# Patient Record
Sex: Male | Born: 1935 | Race: White | Hispanic: No | Marital: Married | Smoking: Former smoker
Health system: Southern US, Community
[De-identification: ages and names within clinical notes are randomized; demographics above are authoritative.]

## PROBLEM LIST (undated history)

## (undated) DIAGNOSIS — T18128A Food in esophagus causing other injury, initial encounter: Secondary | ICD-10-CM

## (undated) DIAGNOSIS — K222 Esophageal obstruction: Secondary | ICD-10-CM

## (undated) DIAGNOSIS — I350 Nonrheumatic aortic (valve) stenosis: Principal | ICD-10-CM

## (undated) DIAGNOSIS — I714 Abdominal aortic aneurysm, without rupture, unspecified: Secondary | ICD-10-CM

## (undated) DIAGNOSIS — N184 Chronic kidney disease, stage 4 (severe): Secondary | ICD-10-CM

## (undated) DIAGNOSIS — K2270 Barrett's esophagus without dysplasia: Secondary | ICD-10-CM

## (undated) DIAGNOSIS — W44F3XA Food entering into or through a natural orifice, initial encounter: Secondary | ICD-10-CM

## (undated) DIAGNOSIS — C159 Malignant neoplasm of esophagus, unspecified: Secondary | ICD-10-CM

## (undated) DIAGNOSIS — K3184 Gastroparesis: Secondary | ICD-10-CM

## (undated) DIAGNOSIS — I10 Essential (primary) hypertension: Secondary | ICD-10-CM

## (undated) DIAGNOSIS — I420 Dilated cardiomyopathy: Secondary | ICD-10-CM

## (undated) DIAGNOSIS — E039 Hypothyroidism, unspecified: Secondary | ICD-10-CM

## (undated) DIAGNOSIS — M1990 Unspecified osteoarthritis, unspecified site: Secondary | ICD-10-CM

## (undated) HISTORY — DX: Gastroparesis: K31.84

## (undated) HISTORY — PX: VAGOTOMY: SUR1431

## (undated) HISTORY — PX: ESOPHAGECTOMY: SUR457

## (undated) HISTORY — DX: Esophageal obstruction: K22.2

## (undated) HISTORY — DX: Dilated cardiomyopathy: I42.0

## (undated) HISTORY — DX: Malignant neoplasm of esophagus, unspecified: C15.9

## (undated) HISTORY — DX: Food entering into or through a natural orifice, initial encounter: W44.F3XA

## (undated) HISTORY — DX: Hypothyroidism, unspecified: E03.9

## (undated) HISTORY — DX: Essential (primary) hypertension: I10

## (undated) HISTORY — PX: UMBILICAL HERNIA REPAIR: SHX196

## (undated) HISTORY — DX: Food in esophagus causing other injury, initial encounter: T18.128A

## (undated) HISTORY — DX: Nonrheumatic aortic (valve) stenosis: I35.0

## (undated) HISTORY — DX: Barrett's esophagus without dysplasia: K22.70

---

## 1988-06-23 DIAGNOSIS — C159 Malignant neoplasm of esophagus, unspecified: Secondary | ICD-10-CM

## 1988-06-23 HISTORY — DX: Malignant neoplasm of esophagus, unspecified: C15.9

## 1999-06-06 ENCOUNTER — Encounter: Admission: RE | Admit: 1999-06-06 | Discharge: 1999-06-06 | Payer: Self-pay | Admitting: Thoracic Surgery

## 1999-06-06 ENCOUNTER — Encounter: Payer: Self-pay | Admitting: Thoracic Surgery

## 2000-06-11 ENCOUNTER — Encounter: Payer: Self-pay | Admitting: Thoracic Surgery

## 2000-06-11 ENCOUNTER — Encounter: Admission: RE | Admit: 2000-06-11 | Discharge: 2000-06-11 | Payer: Self-pay | Admitting: Thoracic Surgery

## 2001-12-08 ENCOUNTER — Encounter: Admission: RE | Admit: 2001-12-08 | Discharge: 2001-12-08 | Payer: Self-pay | Admitting: Family Medicine

## 2001-12-08 ENCOUNTER — Encounter: Payer: Self-pay | Admitting: Family Medicine

## 2002-04-18 ENCOUNTER — Encounter: Payer: Self-pay | Admitting: Family Medicine

## 2002-04-18 ENCOUNTER — Encounter: Admission: RE | Admit: 2002-04-18 | Discharge: 2002-04-18 | Payer: Self-pay | Admitting: Family Medicine

## 2002-04-28 ENCOUNTER — Ambulatory Visit (HOSPITAL_COMMUNITY): Admission: RE | Admit: 2002-04-28 | Discharge: 2002-04-28 | Payer: Self-pay | Admitting: Family Medicine

## 2002-04-28 ENCOUNTER — Encounter: Payer: Self-pay | Admitting: Family Medicine

## 2002-10-18 ENCOUNTER — Encounter: Admission: RE | Admit: 2002-10-18 | Discharge: 2002-10-18 | Payer: Self-pay | Admitting: Family Medicine

## 2002-10-18 ENCOUNTER — Encounter: Payer: Self-pay | Admitting: Family Medicine

## 2003-06-08 ENCOUNTER — Encounter: Admission: RE | Admit: 2003-06-08 | Discharge: 2003-06-08 | Payer: Self-pay | Admitting: Family Medicine

## 2004-07-12 ENCOUNTER — Encounter: Admission: RE | Admit: 2004-07-12 | Discharge: 2004-07-12 | Payer: Self-pay | Admitting: Family Medicine

## 2004-07-24 ENCOUNTER — Encounter: Admission: RE | Admit: 2004-07-24 | Discharge: 2004-07-24 | Payer: Self-pay

## 2004-07-30 ENCOUNTER — Ambulatory Visit (HOSPITAL_BASED_OUTPATIENT_CLINIC_OR_DEPARTMENT_OTHER): Admission: RE | Admit: 2004-07-30 | Discharge: 2004-07-30 | Payer: Self-pay

## 2004-07-30 ENCOUNTER — Ambulatory Visit (HOSPITAL_COMMUNITY): Admission: RE | Admit: 2004-07-30 | Discharge: 2004-07-30 | Payer: Self-pay

## 2005-04-23 ENCOUNTER — Encounter: Admission: RE | Admit: 2005-04-23 | Discharge: 2005-04-23 | Payer: Self-pay | Admitting: Family Medicine

## 2005-11-19 ENCOUNTER — Encounter: Admission: RE | Admit: 2005-11-19 | Discharge: 2005-11-19 | Payer: Self-pay | Admitting: Vascular Surgery

## 2006-04-13 ENCOUNTER — Encounter: Admission: RE | Admit: 2006-04-13 | Discharge: 2006-04-13 | Payer: Self-pay | Admitting: Family Medicine

## 2006-12-02 ENCOUNTER — Ambulatory Visit: Payer: Self-pay | Admitting: Vascular Surgery

## 2007-04-26 ENCOUNTER — Encounter: Admission: RE | Admit: 2007-04-26 | Discharge: 2007-04-26 | Payer: Self-pay | Admitting: Vascular Surgery

## 2007-06-01 ENCOUNTER — Ambulatory Visit: Payer: Self-pay | Admitting: Vascular Surgery

## 2007-06-24 HISTORY — PX: COARCTATION OF AORTA REPAIR: SHX261

## 2007-12-21 ENCOUNTER — Encounter: Admission: RE | Admit: 2007-12-21 | Discharge: 2007-12-21 | Payer: Self-pay | Admitting: Vascular Surgery

## 2007-12-21 ENCOUNTER — Ambulatory Visit: Payer: Self-pay | Admitting: Vascular Surgery

## 2008-01-03 ENCOUNTER — Ambulatory Visit: Payer: Self-pay | Admitting: Vascular Surgery

## 2008-01-03 ENCOUNTER — Ambulatory Visit (HOSPITAL_COMMUNITY): Admission: RE | Admit: 2008-01-03 | Discharge: 2008-01-03 | Payer: Self-pay | Admitting: Vascular Surgery

## 2008-01-12 ENCOUNTER — Ambulatory Visit: Payer: Self-pay

## 2008-01-22 HISTORY — PX: SP ENDO REPAIR INFRAREN AAA: HXRAD399

## 2008-01-25 ENCOUNTER — Ambulatory Visit: Payer: Self-pay | Admitting: Vascular Surgery

## 2008-02-02 ENCOUNTER — Encounter: Payer: Self-pay | Admitting: Vascular Surgery

## 2008-02-02 ENCOUNTER — Ambulatory Visit: Payer: Self-pay

## 2008-02-09 ENCOUNTER — Ambulatory Visit: Payer: Self-pay | Admitting: Vascular Surgery

## 2008-02-09 ENCOUNTER — Encounter: Payer: Self-pay | Admitting: Vascular Surgery

## 2008-02-09 ENCOUNTER — Inpatient Hospital Stay (HOSPITAL_COMMUNITY): Admission: RE | Admit: 2008-02-09 | Discharge: 2008-02-17 | Payer: Self-pay | Admitting: Vascular Surgery

## 2008-02-29 ENCOUNTER — Ambulatory Visit: Payer: Self-pay | Admitting: Vascular Surgery

## 2008-08-07 ENCOUNTER — Encounter: Payer: Self-pay | Admitting: Family Medicine

## 2009-02-19 ENCOUNTER — Ambulatory Visit: Payer: Self-pay | Admitting: Family Medicine

## 2009-02-19 DIAGNOSIS — I10 Essential (primary) hypertension: Secondary | ICD-10-CM | POA: Insufficient documentation

## 2009-02-19 DIAGNOSIS — Z8501 Personal history of malignant neoplasm of esophagus: Secondary | ICD-10-CM

## 2009-02-19 DIAGNOSIS — E039 Hypothyroidism, unspecified: Secondary | ICD-10-CM | POA: Insufficient documentation

## 2009-02-19 DIAGNOSIS — R5383 Other fatigue: Secondary | ICD-10-CM

## 2009-02-19 DIAGNOSIS — R5381 Other malaise: Secondary | ICD-10-CM

## 2009-02-22 LAB — CONVERTED CEMR LAB
ALT: 9 units/L (ref 0–53)
Albumin: 3.7 g/dL (ref 3.5–5.2)
Alkaline Phosphatase: 89 units/L (ref 39–117)
Basophils Absolute: 0 10*3/uL (ref 0.0–0.1)
Bilirubin, Direct: 0 mg/dL (ref 0.0–0.3)
Calcium: 10.2 mg/dL (ref 8.4–10.5)
Eosinophils Absolute: 0 10*3/uL (ref 0.0–0.7)
Hemoglobin: 12.6 g/dL — ABNORMAL LOW (ref 13.0–17.0)
Lymphocytes Relative: 24.4 % (ref 12.0–46.0)
Lymphs Abs: 1.5 10*3/uL (ref 0.7–4.0)
MCHC: 33.8 g/dL (ref 30.0–36.0)
Neutro Abs: 4.3 10*3/uL (ref 1.4–7.7)
Neutrophils Relative %: 67.7 % (ref 43.0–77.0)
Platelets: 308 10*3/uL (ref 150.0–400.0)
TSH: 1.43 microintl units/mL (ref 0.35–5.50)
Total Bilirubin: 1.1 mg/dL (ref 0.3–1.2)
WBC: 6.2 10*3/uL (ref 4.5–10.5)

## 2009-02-23 LAB — CONVERTED CEMR LAB: Transferrin: 234 mg/dL (ref 212.0–360.0)

## 2009-02-28 ENCOUNTER — Ambulatory Visit: Payer: Self-pay | Admitting: Family Medicine

## 2009-03-01 LAB — CONVERTED CEMR LAB: Ferritin: 80 ng/mL (ref 22.0–322.0)

## 2009-03-08 ENCOUNTER — Ambulatory Visit: Payer: Self-pay | Admitting: Vascular Surgery

## 2009-03-22 ENCOUNTER — Ambulatory Visit: Payer: Self-pay | Admitting: Family Medicine

## 2009-03-22 LAB — CONVERTED CEMR LAB: OCCULT 3: NEGATIVE

## 2009-06-01 ENCOUNTER — Encounter: Payer: Self-pay | Admitting: *Deleted

## 2009-07-24 ENCOUNTER — Ambulatory Visit: Payer: Self-pay | Admitting: Family Medicine

## 2009-07-24 DIAGNOSIS — K219 Gastro-esophageal reflux disease without esophagitis: Secondary | ICD-10-CM

## 2009-08-21 ENCOUNTER — Ambulatory Visit: Payer: Self-pay | Admitting: Family Medicine

## 2009-09-21 ENCOUNTER — Ambulatory Visit: Payer: Self-pay | Admitting: Family Medicine

## 2009-09-28 ENCOUNTER — Ambulatory Visit: Payer: Self-pay | Admitting: Family Medicine

## 2009-12-21 ENCOUNTER — Ambulatory Visit: Payer: Self-pay | Admitting: Family Medicine

## 2009-12-21 DIAGNOSIS — K5900 Constipation, unspecified: Secondary | ICD-10-CM | POA: Insufficient documentation

## 2009-12-25 LAB — CONVERTED CEMR LAB
BUN: 29 mg/dL — ABNORMAL HIGH (ref 6–23)
CO2: 25 meq/L (ref 19–32)
Chloride: 110 meq/L (ref 96–112)
Creatinine, Ser: 1.3 mg/dL (ref 0.4–1.5)
Potassium: 4.5 meq/L (ref 3.5–5.1)
Sodium: 143 meq/L (ref 135–145)
TSH: 2.71 microintl units/mL (ref 0.35–5.50)

## 2010-07-14 ENCOUNTER — Encounter: Payer: Self-pay | Admitting: Vascular Surgery

## 2010-07-23 NOTE — Assessment & Plan Note (Signed)
Summary: follow up appt/cjr   Vital Signs:  Patient profile:   77 year old male Height:      68 inches Weight:      137.50 pounds Temp:     99.1 degrees F oral BP sitting:   160 / 68  (left arm) Cuff size:   regular  Vitals Entered By: Sid Falcon LPN (July 24, 2009 11:31 AM) CC: Follow-up visit, written med refills   History of Present Illness: Patient here for followup regarding multiple medical problems including history of GERD, hypertension, and hypothyroidism. Remote history of esophageal cancer. He has been disease-free for many years. Reflux symptoms well controlled. No recent dysphagia.  Lab work checked in August thyroid stable that time. No sxs to suggest hypothyroidism.  Hypertension follow-up      This is a 77 year old man who presents for Hypertension follow-up.  The patient denies lightheadedness, urinary frequency, headaches, edema, impotence, rash, and fatigue.  The patient denies the following associated symptoms: chest pain, chest pressure, exercise intolerance, dyspnea, palpitations, syncope, leg edema, and pedal edema.  Compliance with medications (by patient report) has been near 100%.  The patient reports that dietary compliance has been good.    Preventive Screening-Counseling & Management  Alcohol-Tobacco     Smoking Status: quit     Year Started: 1953     Year Quit: 1972  Allergies: No Known Drug Allergies  Past History:  Family History: Last updated: 02/19/2009 father Pancreatic cancer  Social History: Last updated: 02/19/2009 Retired Married Former Smoker Alcohol use-no  Risk Factors: Smoking Status: quit (07/24/2009)  Past medical, surgical, family and social histories (including risk factors) reviewed for relevance to current acute and chronic problems.  Past Medical History: Hypertension Hypothyroidism Kidney stones Esophageal Cancer 1990  Past Surgical History: Reviewed history from 02/19/2009 and no changes  required. AAA repair 8/09 Esophageal sec to Cancer about 1990  Family History: Reviewed history from 02/19/2009 and no changes required. father Pancreatic cancer  Social History: Reviewed history from 02/19/2009 and no changes required. Retired Married Former Smoker Alcohol use-no  Review of Systems  The patient denies anorexia, fever, weight loss, weight gain, chest pain, syncope, dyspnea on exertion, peripheral edema, prolonged cough, headaches, abdominal pain, melena, and hematochezia.    Physical Exam  General:  Well-developed,well-nourished,in no acute distress; alert,appropriate and cooperative throughout examination Eyes:  pupils equal, pupils round, and pupils reactive to light.   Ears:  External ear exam shows no significant lesions or deformities.  Otoscopic examination reveals clear canals, tympanic membranes are intact bilaterally without bulging, retraction, inflammation or discharge. Hearing is grossly normal bilaterally. Mouth:  Oral mucosa and oropharynx without lesions or exudates.  Teeth in good repair. Neck:  No deformities, masses, or tenderness noted. Lungs:  Normal respiratory effort, chest expands symmetrically. Lungs are clear to auscultation, no crackles or wheezes. Heart:  normal rate and regular rhythm.  2 to 3/6 SEM RUSB. Abdomen:  soft, non-tender, and no masses.   Extremities:  No clubbing, cyanosis, edema, or deformity noted with normal full range of motion of all joints.   Neurologic:  alert & oriented X3, strength normal in all extremities, and gait normal.   Cervical Nodes:  No lymphadenopathy noted   Impression & Recommendations:  Problem # 1:  HYPERTENSION (ICD-401.9) Assessment Deteriorated increase dose of losartan. His updated medication list for this problem includes:    Losartan Potassium-hctz 100-12.5 Mg Tabs (Losartan potassium-hctz) ..... One by mouth once daily  Problem #  2:  HYPOTHYROIDISM (ICD-244.9) Assessment:  Unchanged refilled med. His updated medication list for this problem includes:    Levothyroxine Sodium 75 Mcg Tabs (Levothyroxine sodium) .Marland Kitchen... 1 once daily  Problem # 3:  GERD (ICD-530.81) Assessment: Unchanged  His updated medication list for this problem includes:    Tgt Omeprazole 20 Mg Tbec (Omeprazole) .Marland Kitchen... 1 once daily  Complete Medication List: 1)  Levothyroxine Sodium 75 Mcg Tabs (Levothyroxine sodium) .Marland Kitchen.. 1 once daily 2)  Tgt Omeprazole 20 Mg Tbec (Omeprazole) .Marland Kitchen.. 1 once daily 3)  Losartan Potassium-hctz 100-12.5 Mg Tabs (Losartan potassium-hctz) .... One by mouth once daily 4)  Aspir-low 81 Mg Tbec (Aspirin) .Marland Kitchen.. 1 once daily 5)  Fish Oil 1200 Mg Caps (Omega-3 fatty acids) .... Once daily 6)  [facility] Stool Softener 100 Mg Caps (Docusate sodium) .... 2 daily  Patient Instructions: 1)  Please schedule a follow-up appointment in 1 month.  2)  May take 2 Hyzaar per day until current prescription runs out Prescriptions: TGT OMEPRAZOLE 20 MG TBEC (OMEPRAZOLE) 1 once daily  #90 x 3   Entered and Authorized by:   Evelena Peat MD   Signed by:   Evelena Peat MD on 07/24/2009   Method used:   Print then Give to Patient   RxID:   [id number] LOSARTAN POTASSIUM-HCTZ 100-12.5 MG TABS (LOSARTAN POTASSIUM-HCTZ) one by mouth once daily  #90 x 3   Entered and Authorized by:   Evelena Peat MD   Signed by:   Evelena Peat MD on 07/24/2009   Method used:   Print then Give to Patient   RxID:   [id number] LEVOTHYROXINE SODIUM 75 MCG TABS (LEVOTHYROXINE SODIUM) 1 once daily  #90 x 3   Entered and Authorized by:   Evelena Peat MD   Signed by:   Evelena Peat MD on 07/24/2009   Method used:   Print then Give to Patient   RxID:   [id number]

## 2010-07-23 NOTE — Assessment & Plan Note (Signed)
Summary: 3 month rov/njr   Vital Signs:  Patient profile:   77 year old male Weight:      139 pounds Temp:     98.3 degrees F oral BP sitting:   158 / 68  (left arm) Cuff size:   regular  Vitals Entered By: Sid Falcon LPN (December 22, 5619 1:13 PM)  Serial Vital Signs/Assessments:  Time      Position  BP       Pulse  Resp  Temp     By                     150/68                         Evelena Peat MD    History of Present Illness: Patient seen for followup hypertension. Addition of amlodipine several months ago and blood pressures by home readings 130s systolic and 60s diastolic. Compliant with medication. No side effects. No orthostatic symptoms. Denies any chest pains or palpitations.  Hypothyroidism treated with levothyroxine. Needs followup lab work.  No recent weight changes. Has difficulty gaining weight but stays very active physically.  Remote history of esophageal cancer. History of GERD. Symptoms controlled with once daily omeprazole.  Ongoing constipation issues. No bloody stools. No abdominal pain. No appetite or weight changes. Takes OTC meds inconsistently without relief.  Good fiber intake.  Has had prior colonoscopy -?date.  Allergies (verified): No Known Drug Allergies  Past History:  Past Medical History: Last updated: 07/24/2009 Hypertension Hypothyroidism Kidney stones Esophageal Cancer 1990  Past Surgical History: Last updated: 02/19/2009 AAA repair 8/09 Esophageal sec to Cancer about 1990  Social History: Last updated: 02/19/2009 Retired Married Former Smoker Alcohol use-no PMH-FH-SH reviewed for relevance  Review of Systems  The patient denies anorexia, fever, weight loss, chest pain, syncope, dyspnea on exertion, peripheral edema, prolonged cough, hemoptysis, abdominal pain, melena, hematochezia, and severe indigestion/heartburn.    Physical Exam  General:  Well-developed,well-nourished,in no acute distress; alert,appropriate and  cooperative throughout examination Mouth:  Oral mucosa and oropharynx without lesions or exudates.  Teeth in good repair. Neck:  No deformities, masses, or tenderness noted. Lungs:  Normal respiratory effort, chest expands symmetrically. Lungs are clear to auscultation, no crackles or wheezes. Heart:  regular rhythm and rate with 2-3/6 systolic murmur right upper sternal border Abdomen:  soft, non-tender, normal bowel sounds, no distention, and no masses.   Extremities:  no edema Neurologic:  alert & oriented X3, cranial nerves II-XII intact, and strength normal in all extremities.   Cervical Nodes:  No lymphadenopathy noted Psych:  Cognition and judgment appear intact. Alert and cooperative with normal attention span and concentration. No apparent delusions, illusions, hallucinations   Impression & Recommendations:  Problem # 1:  HYPERTENSION (ICD-401.9) Assessment Improved Good control by home readings and his cuff has compared in readings with ours on previous visit. His updated medication list for this problem includes:    Losartan Potassium-hctz 100-12.5 Mg Tabs (Losartan potassium-hctz) ..... One by mouth once daily    Amlodipine Besylate 2.5 Mg Tabs (Amlodipine besylate) ..... One by mouth q hs  Orders: Venipuncture (30865) TLB-BMP (Basic Metabolic Panel-BMET) (80048-METABOL) Prescription Created Electronically [phone])  Problem # 2:  HYPOTHYROIDISM (ICD-244.9) repeat TSH. His updated medication list for this problem includes:    Levothyroxine Sodium 75 Mcg Tabs (Levothyroxine sodium) .Marland Kitchen... 1 once daily  Orders: Venipuncture (62952) TLB-TSH (Thyroid Stimulating Hormone) (84443-TSH)  Problem #  3:  CONSTIPATION (ICD-564.00) Assessment: Deteriorated Discussed treatment options.  Increase fluids.  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  He will use Miralax only on rare as needed basis for severe constipation. His updated medication list for this problem  includes:    [facility] Stool Softener 100 Mg Caps (Docusate sodium) .Marland Kitchen... 2 daily  Problem # 4:  GERD (ICD-530.81) Assessment: Unchanged  His updated medication list for this problem includes:    Omeprazole 20 Mg Cpdr (Omeprazole) ..... Once daily  Complete Medication List: 1)  Levothyroxine Sodium 75 Mcg Tabs (Levothyroxine sodium) .Marland Kitchen.. 1 once daily 2)  Omeprazole 20 Mg Cpdr (Omeprazole) .... Once daily 3)  Losartan Potassium-hctz 100-12.5 Mg Tabs (Losartan potassium-hctz) .... One by mouth once daily 4)  Aspir-low 81 Mg Tbec (Aspirin) .Marland Kitchen.. 1 once daily 5)  Fish Oil 1200 Mg Caps (Omega-3 fatty acids) .... Once daily 6)  [facility] Stool Softener 100 Mg Caps (Docusate sodium) .... 2 daily 7)  Amlodipine Besylate 2.5 Mg Tabs (Amlodipine besylate) .... One by mouth q hs  Patient Instructions: 1)   drink plenty of fluids. 2)  consider use of over-the-counter stool softener such as Colace for constipation symptoms 3)  If not relieved with the above short-term infrequent use of MiraLax 4)  Please schedule a follow-up appointment in 6 months .  Prescriptions: AMLODIPINE BESYLATE 2.5 MG TABS (AMLODIPINE BESYLATE) one by mouth q hs  #30 x 11   Entered and Authorized by:   Evelena Peat MD   Signed by:   Evelena Peat MD on 12/21/2009   Method used:   Electronically to        [facility]  [place] [address] (retail)       [address]       [place], [place]  [address]       Ph: [phone] or [phone]       Fax: [phone]   RxID:   [id number]

## 2010-07-23 NOTE — Assessment & Plan Note (Signed)
Summary: 1 MONTH ROV/NJR   Vital Signs:  Patient profile:   77 year old male Weight:      140 pounds Temp:     98.5 degrees F oral BP sitting:   150 / 80  (left arm) Cuff size:   regular  Vitals Entered By: Sid Falcon LPN (August 22, 1658 10:49 AM)  Serial Vital Signs/Assessments:  Time      Position  BP       Pulse  Resp  Temp     By                     168/70                         Evelena Peat MD  CC: one month follow-up, BP, Hypertension Management   History of Present Illness: Follow up hypertension. Patient on losartan hydrochlorothiazide 100/12.5 one daily. No headaches or dizziness. Generally feels well. Not monitoring blood pressure at home. We have recently increased his losartan from 50 to100 mg. Denies any chest pains.  Hypertension History:      He denies headache, chest pain, palpitations, dyspnea with exertion, orthopnea, peripheral edema, neurologic problems, syncope, and side effects from treatment.        Positive major cardiovascular risk factors include male age 28 years old or older and hypertension.  Negative major cardiovascular risk factors include non-tobacco-user status.     Allergies (verified): No Known Drug Allergies  Past History:  Past Medical History: Last updated: 07/24/2009 Hypertension Hypothyroidism Kidney stones Esophageal Cancer 1990 PMH reviewed for relevance  Review of Systems      See HPI  Physical Exam  General:  Well-developed,well-nourished,in no acute distress; alert,appropriate and cooperative throughout examination Neck:  No deformities, masses, or tenderness noted. Lungs:  Normal respiratory effort, chest expands symmetrically. Lungs are clear to auscultation, no crackles or wheezes. Heart:  normal rate and regular rhythm.   Extremities:  no edema   Impression & Recommendations:  Problem # 1:  HYPERTENSION (ICD-401.9) Assessment Deteriorated add amlodipine 2.5 mg daily His updated medication list for this  problem includes:    Losartan Potassium-hctz 100-12.5 Mg Tabs (Losartan potassium-hctz) ..... One by mouth once daily    Amlodipine Besylate 2.5 Mg Tabs (Amlodipine besylate) ..... One by mouth q hs  Complete Medication List: 1)  Levothyroxine Sodium 75 Mcg Tabs (Levothyroxine sodium) .Marland Kitchen.. 1 once daily 2)  Omeprazole 20 Mg Cpdr (Omeprazole) .... Once daily 3)  Losartan Potassium-hctz 100-12.5 Mg Tabs (Losartan potassium-hctz) .... One by mouth once daily 4)  Aspir-low 81 Mg Tbec (Aspirin) .Marland Kitchen.. 1 once daily 5)  Fish Oil 1200 Mg Caps (Omega-3 fatty acids) .... Once daily 6)  [facility] Stool Softener 100 Mg Caps (Docusate sodium) .... 2 daily 7)  Amlodipine Besylate 2.5 Mg Tabs (Amlodipine besylate) .... One by mouth q hs  Hypertension Assessment/Plan:      The patient's hypertensive risk group is category B: At least one risk factor (excluding diabetes) with no target organ damage.  Today's blood pressure is 150/80.    Patient Instructions: 1)  Please schedule a follow-up appointment in 1 month.  2)  Limit your Sodium(salt) .  Prescriptions: AMLODIPINE BESYLATE 2.5 MG TABS (AMLODIPINE BESYLATE) one by mouth q hs  #30 x 5   Entered and Authorized by:   Evelena Peat MD   Signed by:   Evelena Peat MD on 08/21/2009   Method used:  [facility] to        [facility]  [place] [address]       [address]       [place], [place]  [address]       Ph: [phone] or [phone]       Fax: [phone]   RxID:   [id number]

## 2010-07-23 NOTE — Assessment & Plan Note (Signed)
Summary: 1 wk rov/njr   Vital Signs:  Patient profile:   77 year old male Weight:      141 pounds Temp:     98 degrees F oral BP sitting:   138 / 68  (left arm) Cuff size:   regular  Vitals Entered By: Sid Falcon LPN (September 28, 1608 1:10 PM) CC: BP check, Back Pain, Hypertension Management   History of Present Illness: Patient here for recheck blood pressure.  Excellent control by home readings ranging 113-138 systolic. No dizziness. No edema issues. Recent increased readings here. He remains on losartan hydrochlorothiazide in addition to low-dose amlodipine. States quite active. No exercise intolerance.  Hypertension History:      He denies headache, chest pain, palpitations, dyspnea with exertion, orthopnea, PND, peripheral edema, visual symptoms, neurologic problems, syncope, and side effects from treatment.        Positive major cardiovascular risk factors include male age 5 years old or older and hypertension.  Negative major cardiovascular risk factors include non-tobacco-user status.      Allergies (verified): No Known Drug Allergies  Past History:  Past Medical History: Last updated: 07/24/2009 Hypertension Hypothyroidism Kidney stones Esophageal Cancer 1990  Physical Exam  General:  Well-developed,well-nourished,in no acute distress; alert,appropriate and cooperative throughout examination Lungs:  Normal respiratory effort, chest expands symmetrically. Lungs are clear to auscultation, no crackles or wheezes. Heart:  normal rate and regular rhythm.   Extremities:  no edema   Impression & Recommendations:  Problem # 1:  HYPERTENSION (ICD-401.9) Assessment Improved by our machine today 138/68 and by his machine 133/65. Continue close monitoring. His updated medication list for this problem includes:    Losartan Potassium-hctz 100-12.5 Mg Tabs (Losartan potassium-hctz) ..... One by mouth once daily    Amlodipine Besylate 2.5 Mg Tabs (Amlodipine besylate)  ..... One by mouth q hs  Complete Medication List: 1)  Levothyroxine Sodium 75 Mcg Tabs (Levothyroxine sodium) .Marland Kitchen.. 1 once daily 2)  Omeprazole 20 Mg Cpdr (Omeprazole) .... Once daily 3)  Losartan Potassium-hctz 100-12.5 Mg Tabs (Losartan potassium-hctz) .... One by mouth once daily 4)  Aspir-low 81 Mg Tbec (Aspirin) .Marland Kitchen.. 1 once daily 5)  Fish Oil 1200 Mg Caps (Omega-3 fatty acids) .... Once daily 6)  [facility]vs Stool Softener 100 Mg Caps (Docusate sodium) .... 2 daily 7)  Amlodipine Besylate 2.5 Mg Tabs (Amlodipine besylate) .... One by mouth q hs  Hypertension Assessment/Plan:      The patient's hypertensive risk group is category B: At least one risk factor (excluding diabetes) with no target organ damage.  Today's blood pressure is 138/68.    Patient Instructions: 1)  Please schedule a follow-up appointment in 6 months .

## 2010-07-23 NOTE — Assessment & Plan Note (Signed)
Summary: 1 MONTH ROV/NJR   Vital Signs:  Patient profile:   77 year old male Weight:      142 pounds Temp:     98.7 degrees F oral BP sitting:   160 / 70  (left arm) Cuff size:   regular  Vitals Entered By: Sid Falcon LPN (September 22, 1306 11:09 AM) CC: 1 month follow-up on BP   History of Present Illness: patient here for f/u hypertension. Added amlodipine last visit and he has tolerated well with no side effects. Blood pressure well controlled by home readings with mostly 120s to 130s systolic. Diastolics consistently in the 60s. He denies any headaches, dizziness, or chest pain. Also remains on losartan HCTZ. Compliant with medication.  Allergies (verified): No Known Drug Allergies  Past History:  Past Medical History: Last updated: 07/24/2009 Hypertension Hypothyroidism Kidney stones Esophageal Cancer 1990 PMH reviewed for relevance  Review of Systems  The patient denies weight loss, weight gain, chest pain, syncope, dyspnea on exertion, peripheral edema, prolonged cough, headaches, hemoptysis, and abdominal pain.    Physical Exam  General:  Well-developed,well-nourished,in no acute distress; alert,appropriate and cooperative throughout examination Lungs:  Normal respiratory effort, chest expands symmetrically. Lungs are clear to auscultation, no crackles or wheezes. Heart:  normal rate and regular rhythm.  2/6 systolic murmur right upper sternal border Extremities:  no edema   Impression & Recommendations:  Problem # 1:  HYPERTENSION (ICD-401.9) improved home readings but elevated systolic reading here. Patient to bring back his blood pressure cuff next week to check with ours His updated medication list for this problem includes:    Losartan Potassium-hctz 100-12.5 Mg Tabs (Losartan potassium-hctz) ..... One by mouth once daily    Amlodipine Besylate 2.5 Mg Tabs (Amlodipine besylate) ..... One by mouth q hs  Complete Medication List: 1)  Levothyroxine Sodium  75 Mcg Tabs (Levothyroxine sodium) .Marland Kitchen.. 1 once daily 2)  Omeprazole 20 Mg Cpdr (Omeprazole) .... Once daily 3)  Losartan Potassium-hctz 100-12.5 Mg Tabs (Losartan potassium-hctz) .... One by mouth once daily 4)  Aspir-low 81 Mg Tbec (Aspirin) .Marland Kitchen.. 1 once daily 5)  Fish Oil 1200 Mg Caps (Omega-3 fatty acids) .... Once daily 6)  [facility]vs Stool Softener 100 Mg Caps (Docusate sodium) .... 2 daily 7)  Amlodipine Besylate 2.5 Mg Tabs (Amlodipine besylate) .... One by mouth q hs  Patient Instructions: 1)  bring back your blood pressure cuff to check with ours next week. 2)  Please schedule a follow-up appointment in 3 months .  Prescriptions: OMEPRAZOLE 20 MG CPDR (OMEPRAZOLE) once daily  #90 x 3   Entered and Authorized by:   Evelena Peat MD   Signed by:   Evelena Peat MD on 09/21/2009   Method used:   Print then Give to Patient   RxID:   [id number]

## 2010-11-05 NOTE — Assessment & Plan Note (Signed)
OFFICE VISIT   Isaiah Graham, Isaiah Graham  DOB:  April 14, 1930                                       06/01/2007  ZOXWR#:[id number]   I saw the patient in the office today for continued followup of his  infrarenal abdominal aortic aneurysm.  Since I saw him last 6 months  ago, he has had no significant abdominal or back pain.  There has been  no significant change in his medical history, except that he had an  episode of Bell's palsy.   REVIEW OF SYSTEMS:  He has had no recent chest pain, chest pressure,  palpitations, or arrhythmias.  He has had no bronchitis, asthma, or wheezing.  He is not a smoker.  He  quit in 1972.  He has had no claudication, rest pain, or non-healing ulcers.   PHYSICAL EXAMINATION:  This is a pleasant 77 year old gentleman who  appears his stated age.  Blood pressure is 190/70, heart rate is 58.  I  do not detect any carotid bruits.  There is no cervical lymphadenopathy.  Lungs are clear bilaterally to auscultation.  On cardiac exam, he has a  regular rate and rhythm.  His aneurysm is palpable but nontender.  He  has palpable femoral, popliteal, and pedal pulses bilaterally.  He has  no evidence of atheroembolic disease.   Ultrasound in our office today shows that the maximum diameter of his  aneurysm is 5 cm, which is up approximately 3 mm compared to his study 6  months ago.   He understands that we generally would not consider elective repair of  the aneurysm unless it reached 5.5 cm in maximum diameter.  I plan on  seeing him back in 6 months with a followup CT scan.  He also has some  small pulmonary nodules, which we have been following.  On his most  recent CT scan of the chest in November of this year, the nodules were  stable in size.  We  will obtain a CT of the chest in 6 months also.  If the CT is stable at  that time, we might be able to extend his followup of these nodules out  to a year.   Di Kindle. Edilia Bo,  M.D.  Electronically Signed   CSD/MEDQ  D:  06/01/2007  T:  06/02/2007  Job:  [id number]   cc:   Evelena Peat, M.D.

## 2010-11-05 NOTE — Assessment & Plan Note (Signed)
OFFICE VISIT   [name], [name]  DOB:  Nov 15, 1929                                       02/29/2008  ZOXWR#:[id number]   I saw the patient in the office today for followup after recent repair  of his abdominal aortic aneurysm.  This is a 77 year old gentleman whom  I have been following with an abdominal aortic aneurysm that enlarged to  greater than 5.5 cm.  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  He underwent elective repair  with ligation of the left hypogastric artery aneurysm on 02/09/2008.  He  did well postoperatively and was discharged on February 16, 1989.  He did  have some anemia postoperatively and was started on iron at the time of  discharge.   Since I saw him in the hospital, he states his appetite is improved.  He  has been gradually regaining his strength and he has no specific  complaints.   PHYSICAL EXAMINATION:  His blood pressure is 188/75, heart rate is 62.  Lungs are clear bilaterally to auscultation.  Cardiac exam, he has a  regular rate and rhythm.  Abdomen is soft and nontender.  He has normal  pitched bowel sounds.  His incision is healing nicely.  Palpable femoral  and pedal pulses.   Doppler study in our office today shows normal ABIs of 100% bilaterally.   Overall, I am pleased with his progress.  I plan on seeing him back in 6  months.  I have stressed to him the importance of receiving prophylactic  antibiotics for any invasive procedures such as colonoscopy.   Di Kindle. Edilia Bo, M.D.  Electronically Signed   CSD/MEDQ  D:  02/29/2008  T:  03/01/2008  Job:  [id number]   cc:   Evelena Peat, M.D.

## 2010-11-05 NOTE — Op Note (Signed)
Isaiah Graham, Isaiah NO.:  [id number]   MEDICAL RECORD NO.:  [id number]          PATIENT TYPE:  INP   LOCATION:  2304                         FACILITY:  [facility]   PHYSICIAN:  Di Kindle. Edilia Bo, M.D.DATE OF BIRTH:  [birth year]   DATE OF PROCEDURE:  02/09/2008  DATE OF DISCHARGE:                               OPERATIVE REPORT   PREOPERATIVE DIAGNOSES:  Infrarenal abdominal aortic aneurysm, 5.5 cm,  and left hypogastric artery aneurysm.   POSTOPERATIVE DIAGNOSES:  Infrarenal abdominal aortic aneurysm, 5.5-cm,  and left hypogastric artery aneurysm.   PROCEDURE:  1. Open repair of abdominal aortic aneurysm with aorto-bi-iliac bypass      graft (16 x 8-mm Dacron graft).  2. Ligation of left hypogastric artery aneurysm.   SURGEON:  Di Kindle. Edilia Bo, MD   ASSISTANT:  Jerold Coombe, PA   ANESTHESIA:  General.   INDICATIONS:  This is a 77 year old gentleman, who I had been following  with an abdominal aortic aneurysm that enlarged to greater than 5.5 cm.  He also had bilateral common iliac artery aneurysms, and I did not think  he was a good candidate for endovascular repair of his aneurysm in  addition to having a left hypogastric artery aneurysm.  Therefore, open  repair was recommended because of the 5-10% per year risk of rupture.  The procedure and potential complications were discussed with the  patient preoperatively.  All of his questions were answered.  He was  agreeable to proceed.   TECHNIQUE:  The patient was taken to the operating room after a Swan-  Ganz catheter and arterial line had been placed by Anesthesia.  The  abdomen and groins were prepped and draped in usual sterile fashion.  The abdomen was entered through a midline incision.  This patient had  had a previous jejunostomy and it was a loop of jejunum's which was  adhered to the intra-abdominal wall and this was taken down without  difficulty.  The patient had had a previous  transhiatal esophagectomy  and jejunostomy in 1996.  The small bowel was reflected to the right.  The retroperitoneal tissue was divided allowing exposure of the aneurysm  up to the level of the renal vein.  The infrarenal aorta was dissected  free circumferentially.  The IMA was controlled with a 2-0 silk tie.  I  extended the division of the retroperitoneum down the right side onto  the right common iliac artery which was ectatic and aneurysmal.  This  was taken down to the bifurcation and the left hypogastric artery and  the left external iliac artery were controlled.  Next, the sigmoid colon  was reflected to the right, and the retroperitoneal plane entered, and  the external iliac artery on the left was dissected free as well as the  hypogastric artery aneurysm.  I was able to get a Tevdek suture around  the hypogastric artery aneurysm and this was ligated.  I also placed a  Tevdek suture around the left common iliac artery.  The tunnel was then  created under the retroperitoneum on the left.  Next, a 16 x 8 graft was  selected.  The patient was heparinized and also received mannitol.  The  hypogastric artery on the right and the external iliac artery were  clamped.  The common iliac artery was ligated after the distal external  iliac artery on the left was clamped.  The infrarenal aorta was clamped  and the aneurysm entered.  Lumbars were oversewn with 2-0 silk ties.  There was brisk backbleeding from the IMA which was oversewn with 2-0  silk tie.  The neck was divided circumferentially.  The graft was cut to  the appropriate length and sewn end-to-end to the infrarenal aorta using  a felt cuff using continuous 3-0 Prolene suture.  The proximal  anastomosis was tested and was hemostatic.  The graft was then flushed  and then the infrarenal aorta reclamped and the graft irrigated with  heparinized saline.  Attention was then turned to the right iliac  anastomosis.  The arteriotomy was  extended down onto the right common  iliac artery down to the bifurcation and extended onto the external  iliac artery slightly.  I then did a widely spatulated anastomosis to  incorporate the hypogastric artery on the right with the anastomosis  extending onto the external iliac artery.  This anastomosis was  performed end-to-end with continuous 5-0 Prolene suture.  Prior to  completing this anastomosis, the artery was backbled and flushed  appropriately and the anastomosis completed.  Flow was re-established to  the right leg which the patient tolerated from the hemodynamic  standpoint.  Next, the left limb of the graft was brought through the  previously created tunnel and the external iliac artery was divided and  spatulated.  The graft was cut to the appropriate length, spatulated,  and sewn end-to-end to the artery using continuous 6-0 Prolene suture.  Prior to completing anastomosis, the artery was backbled and flushed  appropriately and the anastomosis completed.  Flow was re-established to  the left leg, which the patient tolerated from a hemodynamic standpoint.  I inspected the bowel which appeared to be well perfused to sigmoid  colon.  The heparin was partially reversed with protamine.  The  retroperitoneal tissue and the left pelvis was closed with running 2-0  Vicryl.  The aneurysm was closed over the graft with running 2-0 Vicryl.  The retroperitoneal tissue was closed with running 2-0 Vicryl.  The  abdominal contents were returned to the normal position and the fascial  layer closed with two #1 PDS sutures.  The skin was closed with staples.  The patient tolerated the procedure well and was transferred to recovery  room in satisfactory condition with palpable pedal pulses and  hemodynamically stable.      Di Kindle. Edilia Bo, M.D.  Electronically Signed     CSD/MEDQ  D:  02/09/2008  T:  02/10/2008  Job:  [id number]   cc:   Ines Bloomer, M.D.

## 2010-11-05 NOTE — Discharge Summary (Signed)
Isaiah Graham, Isaiah Graham NO.:  [id number]   MEDICAL RECORD NO.:  [id number]          PATIENT TYPE:  INP   LOCATION:  2023                         FACILITY:  [facility]   PHYSICIAN:  Di Kindle. Edilia Bo, M.D.DATE OF BIRTH:  Sep 08, 1929   DATE OF ADMISSION:  02/09/2008  DATE OF DISCHARGE:  02/17/2008                               DISCHARGE SUMMARY   The patient of Dr. Edilia Bo.   FINAL DIAGNOSES:  1. Infrarenal abdominal aortic aneurysm.  2. History of hypertension.  3. Remote history of tobacco abuse.  4. Status post transhiatal esophagectomy and jejunostomy March 12, 1995, for Barrett's esophagus with high-grade esophageal dysplasia.   PROCEDURES PERFORMED:  February 09, 2008, open repair of abdominal aortic  aneurysm with aortobi-iliac bypass graft using a 16 x 8-mm Dacron graft  and ligation of left hyperplastic artery aneurysm by Dr. Edilia Bo.   COMPLICATIONS:  None.   CONDITION ON DISCHARGE:  Stable improving.   DISCHARGE MEDICATIONS:  He is instructed to resume all of his  preadmission medications consisting of;  1. Omeprazole 20 mg p.o. q.a.m.  2. Hyzaar 50/12.5 mg p.o. q.a.m.  3. Synthroid 0.075 mg p.o. q.a.m.  4. Aspirin 81 mg p.o. q.a.m.  5. Aleve 1 q.a.m. and p.m.  6. Multivitamin p.o. q.a.m.  7. He is given prescriptions for Nu-Iron 150 mg p.o. b.i.d. and Tylox      1-2 p.o. q.4 h. p.r.n. pain, total #64 given.   DISPOSITION:  He is being discharged home in stable condition with his  wounds healing well.  He is instructed not to drive for the next several  weeks.  He is cautioned about lifting for the next 6 weeks.  He is to  observe his wounds for signs of infection.  He was given an appointment  to see Dr. Edilia Bo in 1-2 weeks with ABIs and for staple removal and for  followup.  The office will arrange this visit.   BRIEF IDENTIFYING STATEMENT:  For complete details, please refer to the  typed history and physical.  Briefly, this very  pleasant 77 year old  gentleman has been followed by Dr. Edilia Bo for an abdominal aortic  aneurysm.  At his most recent followup, the aneurysm had enlarged to 5.5  cm.  Dr. Edilia Bo recommended repair.  He was informed of the risks and  benefits of the procedure, and after careful consideration, elected  proceed with surgery.   HOSPITAL COURSE:  Preoperative workup was completed as an outpatient.  He was brought in through Same-Day Surgery and underwent the  aforementioned repair of his abdominal aortic aneurysm.  He tolerated  the procedure.  He was returned to a bed in the Surgical Intensive Care  Unit.  He remained stable hemodynamically.  We were able to transfer him  to a bed in a surgical convalescent floor by the 2nd postoperative day.   Postoperatively, he did have an ileus.  This was cleared by the 6th  postoperative day.  We were able to advance his diet to a regular diet.  Postoperatively, he also had acute blood loss anemia.  He did receive 2  units of packed red blood cells for this.  His anemia was resolving.   On February 17, 2008, he was desirous of discharge.  He had not had bowel  movement; however, he was passing gas.  A Dulcolax suppository was  administered.  He was felt to be stable and was discharged home.      Wilmon Arms, PA      Di Kindle. Edilia Bo, M.D.  Electronically Signed    KEL/MEDQ  D:  02/17/2008  T:  02/17/2008  Job:  [id number]

## 2010-11-05 NOTE — H&P (Signed)
HISTORY AND PHYSICAL EXAMINATION   January 25, 2008   Re:  Isaiah Graham, Isaiah Graham                DOB:  Dec 07, 1929   REASON FOR ADMISSION:  5.5 cm infrarenal abdominal aortic aneurysm.   HISTORY:  This is a pleasant 77 year old gentleman whom I have been  following with an abdominal aortic aneurysm.  I had originally seen him  in consultation with a 4.6-cm aneurysm in November 2006.  He had been  referred by Dr. Edwyna Shell.  He had undergone previous transhiatal  esophagectomy and jejunostomy by Dr. Edwyna Shell in September of 1996 for  Barrett's esophagus with high-grade esophageal dysplasia.  I have been  following his aneurysm and on his most recent followup visit on  12/21/2007 his aneurysm had enlarged to 5.5 cm.  Given the enlargement  in the aneurysm to 5.5 cm and the 5-10% risk per year risk of rupture, I  have recommended elective repair.  He has had no significant abdominal  or back pain.   PAST MEDICAL HISTORY:  Significant for previous esophageal surgery as  described above.  In addition, he has a history of hypertension.  He  denies any history of diabetes, hypercholesterolemia, history of  previous myocardial infarction, or history of congestive heart failure.  He has no history of COPD.   FAMILY HISTORY:  There is no history of aneurysmal disease and no  history of premature cardiovascular disease except for 1 son who had  undergone PTCA at age 105.   SOCIAL HISTORY:  He is married and has 5 children.  He remains fairly  active on a farm.  He quit smoking tobacco in 1972.  He does not drink  alcohol on a regular basis.   ALLERGIES:  Prozac.   MEDICATIONS:  1. Omeprazole 20 mg p.o. b.i.d.  2. Hyzaar 12.5 mg p.o. daily.  3. Levothyroxine 0.75 mg p.o. daily.  4. Aspirin 81 mg p.o. daily.  5. Aleve over the counter b.i.d. p.r.n.   REVIEW OF SYSTEMS:  GENERAL:  He has had no recent weight loss, weight  gain or problems with his appetite.  CARDIAC:  He has  had no chest pain, chest pressure, palpitations or  arrhythmias.  PULMONARY:  He has had no recent productive cough, bronchitis, asthma or  wheezing.  GI:  He has had no recent change in his bowel habits and has no history  of peptic ulcer disease.  He has had previous esophageal surgery.  GU: He does have some difficulty in starting urination.  He has some  frequency.  VASCULAR:  He had no claudication rest pain, nonhealing ulcers, history  of stroke, TIAs or amaurosis fugax.  He had no DVT or phlebitis.  NEURO:  He had no dizziness, blackouts, headaches or seizures.  ORTHO/SKIN:  Does have some joint pain occasionally.  Psychiatric, ENT, and hematologic review of systems are unremarkable.   PHYSICAL EXAMINATION:  This is a pleasant 77 year old gentleman who  appears his stated age.  His blood pressure is 183/73, heart rate is 53.  Neck is supple.  There is no cervical lymphadenopathy.  I do not detect  any carotid bruits.  Lungs are clear bilaterally to auscultation.  Cardiac exam, he has a regular rate and rhythm.  Abdomen:  Soft and  nontender.  He has an upper abdominal incision from his esophageal  surgery.  Palpable aneurysm which is nontender.  Has palpable femoral  pulses.  He has warm well-perfused  feet without evidence of  atheroembolic disease.   His CT scan shows a 5.5-cm infrarenal abdominal aortic aneurysm.  There  is some tortuosity to the neck and also the iliacs are ectatic and up to  2 cm in diameter.  There is also significant angulation of the  bifurcation and, all things considered, I do not he is a good candidate  for endovascular approach because of the tortuosity of the neck, the  common iliac artery ectasia, and the significant angulation of the  bifurcation.  There is also significant tortuosity of the external iliac  vessels.   He has undergone a Cardiolite which showed low risk stress nuclear  study.  There is thinning of the inferior wall suggestive of  previous  infarct.  There is no ischemia.  The LV is mildly dilated. EF is  calculated at 48% but visually appeared better.  There are no focal wall  motion abnormalities.   He also has some lung nodules which are followed at 6- to 60-month  intervals.  On his most recent followup chest CT in June, the lung  nodules were stable and there were no new nodules.  Greatest right  common iliac artery diameter is 2.1 cm and left 2.3 cm.   I have recommended elective open repair of his abdominal aortic  aneurysm, given the size and 5-10% per year risk of rupture.  We have  discussed the indications for the procedure and the potential  complications including, but not limited to, bleeding, MI, renal  insufficiency, ileus and graft infection and wound problems.  All his  questions were answered.  He is agreeable to proceed.  I have explained  that the risk of mortality or major morbidity is approximately 4%.  All  his questions were answered and he is agreeable to proceed.  His surgery  has been scheduled for August 19.   Di Kindle. Edilia Bo, M.D.  Electronically Signed   CSD/MEDQ  D:  01/25/2008  T:  01/26/2008  Job:  [id number]

## 2010-11-05 NOTE — Procedures (Signed)
DUPLEX ULTRASOUND OF ABDOMINAL AORTA   INDICATION:  Followup of known AAA (abdominal aortic aneurysm).   HISTORY:  Diabetes:  No  Cardiac:  No  Hypertension:  Yes  Smoking:  No  Connective Tissue Disorder:  Family History:  Previous Surgery:  Bell's palsy since March.   DUPLEX EXAM:         AP (cm)                   TRANSVERSE (cm)  Proximal             2.37 cm                   2.53 cm  Mid                  4.75 cm                   5.05 cm  Distal               3.88 cm                   4.58 cm  Right Iliac          1.49 cm                   1.49 cm  Left Iliac           1.95 cm                   1.92 cm   PREVIOUS:  Date: 12/02/2006  AP:  4.66  TRANSVERSE:  4.54   IMPRESSION:  1. AAA (abdominal aortic aneurysm) increased in size with maximum      diameter measuring 4.75 cm      (AP) x 5.05 cm.  2. Left CIA increased in size with maximum diameter measuring 1.95 cm      (AP) x 1.92 cm.   ___________________________________________  Di Kindle. Edilia Bo, M.D.   PB/MEDQ  D:  06/02/2007  T:  06/02/2007  Job:  [id number]

## 2010-11-05 NOTE — Assessment & Plan Note (Signed)
OFFICE VISIT   Isaiah, Graham  DOB:  1930/04/12                                       12/02/2006  EAVWU#:[id number]   I saw Isaiah Graham in the office today for continued followup of his  4.6 cm infrarenal abdominal aortic aneurysm.  I last saw him in May of  2007.  I recommended followup CT scan in 6 months and explained that we  would generally not consider elective repair unless it reached 5.5 cm in  maximum diameter.   Of note, his CT scan at that time showed an incidental finding of an 18  mm nodular opacity in the right lung base, possibly consistent with a  pleural plaque.  We had recommended a followup CT of this in 6 months.  However, this has not been done.   The patient denies any abdominal or back pain.  He has had no pulmonary  symptoms.   REVIEW OF SYSTEMS:  He has had no chest pain, chest pressure,  palpitations, or arrhythmias.  He has had no bronchitis, asthma, or  wheezing.  He has had no hemoptysis.  His review of systems is otherwise  unremarkable and is documented in the medical history form in his chart.   PHYSICAL EXAMINATION:  Blood pressure is 177/72, heart rate is 54.  I do not detect any carotid bruits.  LUNGS:  Clear bilaterally to auscultation.  CARDIOVASCULAR:  He has a regular rate and rhythm.  ABDOMEN:  Soft and nontender.  His aneurysm is easily palpable and  nontender.  He has palpable femoral and pedal pulses bilaterally.   Ultrasound today in our office shows the maximum diameter of his  aneurysm is 4.6 cm and that has not changed over the last year.  Again,  I have explained we could consider elective repair at 5.5 cm.  I have  recommended a followup ultrasound in 6 months and I will see him back at  that time.  Fortunately, he is not a smoker.   With respect to the CT finding before, I have scheduled him for a CT  scan of the chest to follow up this nodule.   Di Kindle. Edilia Bo, M.D.  Electronically Signed   CSD/MEDQ  D:  12/02/2006  T:  12/02/2006  Job:  [id number]   cc:   Evelena Peat, M.D.

## 2010-11-05 NOTE — Op Note (Signed)
Isaiah Graham, Isaiah Graham          ACCOUNT NO.:  [id number]   MEDICAL RECORD NO.:  [id number]          PATIENT TYPE:  AMB   LOCATION:  SDS                          FACILITY:  [facility]   PHYSICIAN:  Di Kindle. Edilia Bo, M.D.DATE OF BIRTH:  07-05-1929   DATE OF PROCEDURE:  01/03/2008  DATE OF DISCHARGE:  01/03/2008                               OPERATIVE REPORT   PREOPERATIVE DIAGNOSIS:  Abdominal aortic aneurysm.   POSTOPERATIVE DIAGNOSIS:  Abdominal aortic aneurysm.   PROCEDURE:  1. Ultrasound-guided access of the right common femoral artery.  2. Aortogram with bilateral iliac arteriogram and bilateral lower      extremity runoff.   SURGEON:  Di Kindle. Edilia Bo, MD   ANESTHESIA:  Local with sedation technique.   The patient was taken to the PV Lab and sedated with a milligram of  Versed and 25 mcg of fentanyl.  Both groins were prepped and draped in  the usual sterile fashion.  Under ultrasound guidance and after the skin  was anesthetized, the right common femoral artery was cannulated and a  guidewire introduced into the infrarenal aorta under fluoroscopic  control.  A 5-French sheath was introduced over the wire.  Next, the  pigtail catheter was positioned at the L1 vertebral body and flush  aortogram obtained.  The catheter was then advanced and the lateral  projection was obtained.  The catheter was then pulled back and oblique  projections were obtained in the neck to better visualize the renal  arteries.  Next, the catheter was pulled down just above the aortic  bifurcation and oblique iliac projections were obtained and bilateral  lower extremity runoff films were obtained.   FINDINGS:  Both renal arteries are patent with no significant renal  artery stenosis identified.  There is some slight tortuosity to the neck  right at the level of the renal arteries.  The size of the aneurysm  cannot accurately be determined by this study.  Both common iliac  arteries are  ectatic and measure approximately 2 cm in diameter.  The  hypogastric arteries and the external iliac arteries are patent  bilaterally.  The external iliac arteries are significantly tortuous.  Bilateral common femoral, superficial femoral, deep femoral, popliteal  and proximal tibial vessels are patent bilaterally.  There is poor  visualization of the tibial vessels distally because of sluggish flow.   CONCLUSIONS:  Infrarenal abdominal aortic aneurysm with ectatic common  iliac arteries bilaterally.      Di Kindle. Edilia Bo, M.D.  Electronically Signed     CSD/MEDQ  D:  01/03/2008  T:  01/03/2008  Job:  [id number]

## 2010-11-05 NOTE — Assessment & Plan Note (Signed)
OFFICE VISIT   Isaiah Graham, Isaiah Graham  DOB:  [birth year]                                       12/21/2007  EAVWU#:[id number]   I saw the patient in the office today for continued followup of his  abdominal aortic aneurysm.  When I last saw him in December of 2008 the  maximum diameter was 5 cm.  We have explained that generally we would  not consider elective repair unless it reached 5.5 cm in maximum  diameter.  He comes in for a 6 month followup study.   Since I saw him last he has had no significant abdominal or back pain.  There has been no significant change in his medical history.   REVIEW OF SYSTEMS:  CONSTITUTIONAL:  On review of systems he has had no  recent chest pain, chest pressure, palpitations or arrhythmias.  PULMONARY:  He has had no productive cough, bronchitis, asthma or  wheezing.   SOCIAL HISTORY:  He quit tobacco in 1972.   PHYSICAL EXAMINATION:  Vital signs:  On physical examination blood  pressure is 189/77, heart rate is 60.  Neck:  I do not detect any  carotid bruits.  Lungs:  Are clear bilaterally to auscultation.  Cardiac:  He has a regular rate and rhythm.  Abdomen:  Soft and  nontender.  His aneurysm is easily palpable and nontender.  He has  palpable femoral pulses and warm, well-perfused feet.  He has no  evidence of atheroembolic disease.   CT scan shows that the aneurysm has enlarged to 5.5 cm in maximum  diameter.  He appears to have some tortuosity of the neck of his  aneurysm which may make him not a good candidate for endovascular  repair.  In addition, he has some ectasia of his common iliac arteries.   Given that the aneurysm has enlarged I have recommended that we proceed  with arteriography and a Cardiolite to begin considering elective repair  of his aneurysm and determine if he is a candidate for endovascular  repair.  His arteriogram has been scheduled for 01/03/2008.  We have  discussed the indications for  the procedure and the potential  complications including but not limited to bleeding, arterial injury and  dye reaction.   In addition, we have been following some small lung nodules and these  have been unchanged in size based on his recent CT scan today and we  will need a followup CT in 12 months to follow this.   We will make further recommendations pending results of his arteriogram.   Di Kindle. Edilia Bo, M.D.  Electronically Signed   CSD/MEDQ  D:  12/21/2007  T:  12/22/2007  Job:  [id number]

## 2010-11-05 NOTE — Assessment & Plan Note (Signed)
OFFICE VISIT   OLAND, ARQUETTE  DOB:  17-Jul-1929                                       03/08/2009  EAVWU#:[id number]   I saw the patient in the office today for followup after previous repair  of his abdominal aortic aneurysm.  He had a 5.5 cm aneurysm and a left  hypogastric artery aneurysm.  He underwent open repair on 02/09/2008.  He has done well and comes in for a yearly followup visit.  Since I saw  him last he has had no significant abdominal or back pain.  His appetite  has returned to normal and his energy level has returned to normal.   REVIEW OF SYSTEMS:  He has had no recent chest pain, chest pressure,  palpitations or arrhythmias.  He has had no productive cough,  bronchitis, asthma or wheezing.   PHYSICAL EXAMINATION:  This a pleasant 77 year old gentleman who appears  his stated age.  His blood pressure is 175/56, heart rate is 50.  Neck  is supple.  I do not detect any carotid bruits.  Lungs are clear  bilaterally to auscultation.  On cardiac exam he has a regular rate and  rhythm.  His abdomen is soft and nontender.  His incision has healed  nicely.  He has no evidence of a hernia.  He has palpable femoral and  popliteal pedal pulses bilaterally.   Overall I am pleased with his progress.  I have instructed him to be  sure to receive prophylactic antibiotics for any invasive procedures.  I  encouraged him to stay as active as possible.  I will see him back  p.r.n.   Di Kindle. Edilia Bo, M.D.  Electronically Signed   CSD/MEDQ  D:  03/08/2009  T:  03/09/2009  Job:  [id number]   cc:   Evelena Peat, M.D.

## 2010-11-08 NOTE — Op Note (Signed)
NAMEKESTON, Isaiah [place]          ACCOUNT NO.:  [id number]   MEDICAL RECORD NO.:  [id number]          PATIENT TYPE:  AMB   LOCATION:  [facility]                         FACILITY:  [facility]   PHYSICIAN:  Lorre Munroe., M.D.DATE OF BIRTH:  1930/06/06   DATE OF PROCEDURE:  07/30/2004  DATE OF DISCHARGE:                                 OPERATIVE REPORT   PREOPERATIVE DIAGNOSIS:  Indirect left inguinal hernia and umbilical hernia.   POSTOPERATIVE DIAGNOSIS:  Indirect left inguinal hernia and umbilical  hernia.   OPERATION:  Repair of indirect left inguinal hernia and umbilical hernia.   SURGEON:  Zigmund Daniel, M.D.   ANESTHESIA:  General and local.   PROCEDURE:  After the patient was monitored and had general anesthesia with  a laryngeal mask airway and had routine preparation and draping of the  central and left lower part of the abdomen, I infiltrated local anesthetic  around the umbilicus and in the left groin region.  I made an oblique  incision beginning just above and lateral to the pubic tubercle and  dissected down through the subcutaneous tissues until I identified the  external oblique aponeurosis and the superficial inguinal ring.  I opened  the fibers in their natural direction into the external ring, taking care to  avoid injury to the ilioinguinal nerve.  I dissected the spermatic cord away  from the inguinal floor at the pubic tubercle and controlled it with a  Penrose drain and then cut the cremaster fibers up to the deep ring, there  was no direct hernia.  I dissected the proximal part of the cord dissecting  free a large indirect hernia sac taking care to avoid the injury to the  spermatic vessels and vas deferens.  I reduced the hernia through the deep  ring and held that in with a generous plug of polypropylene mesh held in  place with a 2-0 silk stitch put in in figure-of-eight fashion in the  internal oblique muscle.  I then fashioned a patch of polypropylene  mesh to  fit the inguinal floor making a slit in it to surround the spermatic cord.  I sewed that in from the pubic tubercle laterally and inferiorly with  running 2-0 Prolene incorporating the shelving edge of the inguinal  ligament.  I sewed it in with a running basting 2-0 Prolene stitch placed  into the internal oblique fascia superiorly and medially.  I sutured the  tails of the mesh together lateral to the cord and deep ring completing the  repair.  Hemostasis was good.  I closed the external oblique and  subcutaneous tissues with separate running layers of 3-0 Vicryl and closed  the skin with intracuticular 4-0 Vicryl and Steri-Strips.  I made about a 3  cm incision just below the umbilicus and dissected the hernia away from the  umbilical skin and surrounding subcutaneous tissues.  It seemed to contain  only preperitoneal fat.  I dissected free the edges of the fascia, I then  cleared the subcutaneous tissues from the edges of the fascia for about 2 cm  in all directions.  I reduced the  hernia contents and dissected the tissues  away from the under surface of the muscles for a couple of cm in all  directions.  I then cut an elliptical piece of polypropylene mesh and spread  it out underneath the fascia and closed the hernia defect with a running 2-0  Prolene stitch incorporating the central part of the mesh into the repair.  I felt  this was a solid repair.  I sutured down the umbilical skin and the  subcutaneous tissues to the fascia to reduce the dead space and restore the  proper appearance of the umbilicus.  I closed the umbilical skin with  intracuticular 4-0 Vicryl and Steri-Strips.  The patient tolerated the  procedure well.      WB/MEDQ  D:  07/30/2004  T:  07/30/2004  Job:  [id number]   cc:   Evelena Peat, M.D.  [address]  [place]  [place] [address]  Fax: [phone]

## 2010-12-02 ENCOUNTER — Encounter: Payer: Self-pay | Admitting: Family Medicine

## 2010-12-04 ENCOUNTER — Ambulatory Visit (INDEPENDENT_AMBULATORY_CARE_PROVIDER_SITE_OTHER): Payer: Medicare Other | Admitting: Family Medicine

## 2010-12-04 ENCOUNTER — Encounter: Payer: Self-pay | Admitting: Family Medicine

## 2010-12-04 DIAGNOSIS — Z8501 Personal history of malignant neoplasm of esophagus: Secondary | ICD-10-CM

## 2010-12-04 DIAGNOSIS — Z23 Encounter for immunization: Secondary | ICD-10-CM

## 2010-12-04 DIAGNOSIS — Z0000 Encounter for general adult medical examination without abnormal findings: Secondary | ICD-10-CM

## 2010-12-04 DIAGNOSIS — Z125 Encounter for screening for malignant neoplasm of prostate: Secondary | ICD-10-CM

## 2010-12-04 DIAGNOSIS — E039 Hypothyroidism, unspecified: Secondary | ICD-10-CM

## 2010-12-04 DIAGNOSIS — E785 Hyperlipidemia, unspecified: Secondary | ICD-10-CM

## 2010-12-04 DIAGNOSIS — K219 Gastro-esophageal reflux disease without esophagitis: Secondary | ICD-10-CM

## 2010-12-04 DIAGNOSIS — R5383 Other fatigue: Secondary | ICD-10-CM

## 2010-12-04 DIAGNOSIS — I10 Essential (primary) hypertension: Secondary | ICD-10-CM

## 2010-12-04 LAB — LDL CHOLESTEROL, DIRECT: Direct LDL: 143 mg/dL

## 2010-12-04 LAB — HEPATIC FUNCTION PANEL
Albumin: 4.3 g/dL (ref 3.5–5.2)
Alkaline Phosphatase: 83 U/L (ref 39–117)
Total Bilirubin: 1 mg/dL (ref 0.3–1.2)
Total Protein: 7.5 g/dL (ref 6.0–8.3)

## 2010-12-04 LAB — BASIC METABOLIC PANEL
Calcium: 10 mg/dL (ref 8.4–10.5)
Creatinine, Ser: 1.3 mg/dL (ref 0.4–1.5)
Glucose, Bld: 100 mg/dL — ABNORMAL HIGH (ref 70–99)

## 2010-12-04 LAB — PSA: PSA: 1.69 ng/mL (ref 0.10–4.00)

## 2010-12-04 LAB — CBC WITH DIFFERENTIAL/PLATELET
Eosinophils Absolute: 0 10*3/uL (ref 0.0–0.7)
HCT: 38.7 % — ABNORMAL LOW (ref 39.0–52.0)
Lymphocytes Relative: 22.2 % (ref 12.0–46.0)
MCHC: 34.5 g/dL (ref 30.0–36.0)
MCV: 91.4 fl (ref 78.0–100.0)
Monocytes Relative: 6.7 % (ref 3.0–12.0)
Neutrophils Relative %: 70.3 % (ref 43.0–77.0)
RBC: 4.24 Mil/uL (ref 4.22–5.81)

## 2010-12-04 LAB — TSH: TSH: 4.9 u[IU]/mL (ref 0.35–5.50)

## 2010-12-04 LAB — LIPID PANEL: Triglycerides: 122 mg/dL (ref 0.0–149.0)

## 2010-12-04 MED ORDER — TETANUS-DIPHTH-ACELL PERTUSSIS 5-2.5-18.5 LF-MCG/0.5 IM SUSP: 0.5000 mL | Freq: Once | INTRAMUSCULAR | Status: DC

## 2010-12-04 NOTE — Progress Notes (Signed)
Subjective:    Patient ID: Isaiah [place], male    DOB: 1930/01/02, 77 y.o.   MRN: [id number]  HPI Patient seen for medical followup. He is here for Medicare wellness exam and follow up medical problems.  Past medical history significant for remote history of esophageal cancer. Had previous abdominal aneurysm repair. Also history of hypothyroidism, hypertension, GERD and some ongoing constipation issues.  Patient is compliant with all medications.  No side effects.  Having some intermittent right groin pain. Previous inguinal hernia repair. Hypothyroidism treated with levothyroxine. . Overdue for labs. Hypertension treated with amlodipine and Hyzaar. Blood pressure well controlled by home readings.  Last tetanus unknown. No history of shingles vaccine. Pneumovax about 5 years ago through the [facility] system. No colonoscopy in several years.  Occasional constipation which is not new.  1.  Risk factors based on Past Medical , Social, and Family history  PMH as above.  Pt is nonsmoker and no ETOH. 2.  Limitations in physical activities  Very active with farming.  Low fall risk. 3.  Depression/mood  No depression or anxiety issues. 4.  Hearing  No acute changes. 5.  ADLs fully independent in all. 6.  Cognitive function (orientation to time and place, language, writing, speech,memory) No problems with language, memory, or judgement. 7.  Home Safety  No issues. 8.  Height, weight, and visual acuity.  Weight stable but trying to gain and unable.  No vision changes. 9.  Counseling  Discussed healthy strategies for weight gain. 10. Recommendation of preventive services.  As above.  Hemoccults given.  Tdap.  Check on Shingles coverage. 11. Labs based on risk factors TSH, PSA, Lipids, BMP, CBC, Hepatic 12. Care Plan  As above.  Also continue with yearly flu vaccine.  Consider repeat colonoscopy. Hemoccults given.  Past Medical History  Diagnosis Date  . Hypertension   . Chronic kidney  disease     kidney stones   . Esophageal cancer 1990  . Hypothyroidism    Past Surgical History  Procedure Date  . Sp endo repair infraren aaa 8/09    es  . Esophageal sec to cancer 1990    reports that he quit smoking about 40 years ago. His smoking use included Cigarettes. He has a 11 pack-year smoking history. He does not have any smokeless tobacco history on file. His alcohol and drug histories not on file. family history includes Cancer in his father. No Known Allergies     Review of Systems  Constitutional: Positive for fatigue. Negative for fever, appetite change and unexpected weight change.  HENT: Negative for ear pain, sore throat, trouble swallowing and voice change.   Respiratory: Negative for cough and shortness of breath.   Cardiovascular: Negative for chest pain, palpitations and leg swelling.  Gastrointestinal: Positive for constipation. Negative for nausea, vomiting, abdominal pain, diarrhea and blood in stool.  Genitourinary: Negative for dysuria.  Skin: Negative for rash.  Neurological: Negative for dizziness, syncope and headaches.  Hematological: Negative for adenopathy. Does not bruise/bleed easily.  Psychiatric/Behavioral: Negative for dysphoric mood.       Objective:   Physical Exam  Constitutional: He is oriented to person, place, and time. He appears well-developed and well-nourished. No distress.  HENT:  Head: Normocephalic and atraumatic.  Right Ear: External ear normal.  Left Ear: External ear normal.  Mouth/Throat: Oropharynx is clear and moist.  Eyes: Conjunctivae and EOM are normal. Pupils are equal, round, and reactive to light.  Neck: Normal range of motion.  Neck supple. No thyromegaly present.  Cardiovascular: Normal rate, regular rhythm and normal heart sounds.   No murmur heard. Pulmonary/Chest: No respiratory distress. He has no wheezes. He has no rales.  Abdominal: Soft. Bowel sounds are normal. He exhibits no distension and no mass.  There is no tenderness. There is no rebound and no guarding.  Genitourinary: Rectum normal and prostate normal.  Musculoskeletal: He exhibits no edema.  Lymphadenopathy:    He has no cervical adenopathy.  Neurological: He is alert and oriented to person, place, and time. He displays normal reflexes. No cranial nerve deficit.  Skin: No rash noted.  Psychiatric: He has a normal mood and affect. His behavior is normal. Judgment and thought content normal.          Assessment & Plan:  #1 Medicare wellness exam. Tetanus booster given. Check on coverage for shingles. Pneumovax up-to-date. Continue yearly flu vaccine. Discussed possible repeat colonoscopy. Hemoccult cards given. Discussed pros and cons of PSA and he prefers to go ahead and check #2 hypothyroidism recheck TSH #3 hypertension. Probable white coat syndrome.  Continue close monitoring #4 remote history esophageal cancer #5 past history of abdominal aortic aneurysm repair #6 difficulty with ability to gain weight since AAA surgery. Weight is at least stable with no weight loss

## 2010-12-04 NOTE — Patient Instructions (Signed)
Continue with yearly flu vaccines Consider repeat colonoscopy. Complete Hemoccult cards.

## 2010-12-05 NOTE — Progress Notes (Signed)
Quick Note:  Pt informed ______ 

## 2011-01-05 ENCOUNTER — Other Ambulatory Visit: Payer: Self-pay | Admitting: Family Medicine

## 2011-03-20 LAB — POCT I-STAT, CHEM 8
BUN: 23
Creatinine, Ser: 1.1
HCT: 38 — ABNORMAL LOW
Hemoglobin: 12.9 — ABNORMAL LOW
Potassium: 3.9
TCO2: 25

## 2011-04-23 ENCOUNTER — Ambulatory Visit: Admitting: Family Medicine

## 2011-04-30 ENCOUNTER — Encounter: Payer: Self-pay | Admitting: Family Medicine

## 2011-04-30 ENCOUNTER — Ambulatory Visit (INDEPENDENT_AMBULATORY_CARE_PROVIDER_SITE_OTHER): Payer: Medicare Other | Admitting: Family Medicine

## 2011-04-30 DIAGNOSIS — I10 Essential (primary) hypertension: Secondary | ICD-10-CM

## 2011-04-30 DIAGNOSIS — E039 Hypothyroidism, unspecified: Secondary | ICD-10-CM

## 2011-04-30 DIAGNOSIS — K219 Gastro-esophageal reflux disease without esophagitis: Secondary | ICD-10-CM

## 2011-04-30 DIAGNOSIS — K4090 Unilateral inguinal hernia, without obstruction or gangrene, not specified as recurrent: Secondary | ICD-10-CM

## 2011-04-30 MED ORDER — OMEPRAZOLE 20 MG PO CPDR: 20.0000 mg | DELAYED_RELEASE_CAPSULE | Freq: Every day | ORAL | Status: DC

## 2011-04-30 MED ORDER — LOSARTAN POTASSIUM-HCTZ 100-12.5 MG PO TABS: 1.0000 | ORAL_TABLET | Freq: Every day | ORAL | Status: DC

## 2011-04-30 MED ORDER — LEVOTHYROXINE SODIUM 75 MCG PO TABS: 75.0000 ug | ORAL_TABLET | Freq: Every day | ORAL | Status: DC

## 2011-04-30 NOTE — Progress Notes (Signed)
  Subjective:    Patient ID: [place], male    DOB: July 21, 1929, 77 y.o.   MRN: [id number]  HPI  Medical followup. Patient needs refills of several medications. He has hypothyroidism, hypertension, and GERD. GERD symptoms stable on omeprazole. He has remote history of esophageal cancer several years ago. No active reflux symptoms. Hypothyroidism treated with levothyroxine. Recent TSH normal. Compliant with medical therapy.  Hypertension treated with Hyzaar and amlodipine. Blood pressures consistently below 130 systolic at home. Systolic elevation today. Denies any headaches, dyspnea, or chest pain.  New problem of right inguinal bulge with mild pain for 3 weeks. Worse with lifting. Prior history of inguinal hernia repair. Denies any hip pain.  Past Medical History  Diagnosis Date  . Hypertension   . Chronic kidney disease     kidney stones   . Esophageal cancer 1990  . Hypothyroidism    Past Surgical History  Procedure Date  . Sp endo repair infraren aaa 8/09    es  . Esophageal sec to cancer 1990    reports that he quit smoking about 40 years ago. His smoking use included Cigarettes. He has a 11 pack-year smoking history. He does not have any smokeless tobacco history on file. His alcohol and drug histories not on file. family history includes Cancer in his father. No Known Allergies    Review of Systems  Constitutional: Negative for chills, appetite change and unexpected weight change.  Respiratory: Negative for shortness of breath.   Cardiovascular: Negative for chest pain, palpitations and leg swelling.  Gastrointestinal: Negative for abdominal pain and abdominal distention.  Genitourinary: Negative for dysuria, decreased urine volume and scrotal swelling.  Neurological: Negative for dizziness, weakness and headaches.  Hematological: Negative for adenopathy.       Objective:   Physical Exam  Constitutional: He appears well-developed and well-nourished.    HENT:  Mouth/Throat: Oropharynx is clear and moist.  Neck: Neck supple. No thyromegaly present.  Cardiovascular: Normal rate and regular rhythm.   Pulmonary/Chest: Effort normal and breath sounds normal. No respiratory distress. He has no wheezes. He has no rales.  Genitourinary:       Patient has evidence for recurrent right inguinal hernia. Nontender to palpation.  Musculoskeletal: He exhibits no edema.  Lymphadenopathy:    He has no cervical adenopathy.          Assessment & Plan:  #1 recurrent right inguinal hernia. Discussed surgical referral and he wishes to wait at this time. Prompt followup for any worsening pain #2 hypothyroidism. Refill levothyroxine #3 hypertension with elevated systolic today. Reassess within one month. Ring home monitor them. Continue close monitoring #4 GERD stable refill on omeprazole for one year

## 2011-04-30 NOTE — Patient Instructions (Signed)
Bring cuff at follow up. Monitor blood pressure and record readings. You have inguinal hernia.  We can look at surgical referral if pain persists

## 2011-05-30 ENCOUNTER — Ambulatory Visit (INDEPENDENT_AMBULATORY_CARE_PROVIDER_SITE_OTHER): Payer: Medicare Other | Admitting: Family Medicine

## 2011-05-30 ENCOUNTER — Encounter: Payer: Self-pay | Admitting: Family Medicine

## 2011-05-30 VITALS — BP 168/70 | Temp 98.6°F | Wt 134.0 lb

## 2011-05-30 DIAGNOSIS — I10 Essential (primary) hypertension: Secondary | ICD-10-CM

## 2011-05-30 MED ORDER — AMLODIPINE BESYLATE 5 MG PO TABS: 5.0000 mg | ORAL_TABLET | Freq: Every day | ORAL | Status: DC

## 2011-05-30 NOTE — Patient Instructions (Signed)
Take amlodipine 2.5 mg two daily until gone then change to 5 mg one daily Bring in new BP cuff so we can check with our cuff.

## 2011-05-30 NOTE — Progress Notes (Signed)
  Subjective:    Patient ID: Isaiah [place], male    DOB: August 11, 1929, 77 y.o.   MRN: [id number]  HPI  Patient seen for followup hypertension. He brings in several home readings which are all excellent but has been consistently more elevated here. Brings in home blood pressure cuff today but unfortunately dropped this on the floor when he first arrived. He is having difficulty getting readings now. Consistently around 120 systolic by home readings. Takes amlodipine 2.5 mg daily and losartan HCTZ 100/12.5 one daily. Denies any headaches, dizziness, shortness of breath, or chest pain. Compliant with medication.  Review of Systems  Constitutional: Negative for fatigue.  Eyes: Negative for visual disturbance.  Respiratory: Negative for cough, chest tightness and shortness of breath.   Cardiovascular: Negative for chest pain, palpitations and leg swelling.  Neurological: Negative for dizziness, syncope, weakness, light-headedness and headaches.       Objective:   Physical Exam  Constitutional: He appears well-developed and well-nourished. No distress.  Cardiovascular: Normal rate, regular rhythm and normal heart sounds.   Pulmonary/Chest: Effort normal and breath sounds normal. No respiratory distress. He has no wheezes. He has no rales.  Musculoskeletal: He exhibits no edema.          Assessment & Plan:  Hypertension with elevated reading here and questionable validity of home readings. He will obtain new home monitor. Had difficulty getting consistent readings today. Increase amlodipine 5 mg and followup for nurse blood pressure check 2 weeks

## 2011-06-05 ENCOUNTER — Telehealth: Payer: Self-pay | Admitting: *Deleted

## 2011-06-05 NOTE — Telephone Encounter (Signed)
VM from pt requesting a BP check at the office as Dr Caryl Never asked him to stop by.

## 2011-06-06 NOTE — Telephone Encounter (Signed)
BP at office, first one was 160/62 5 minutes later 142/60 Pt bought a new home monitor, his readings have been running 142/59, 131/56, 145/66, 142/67 FYI

## 2011-06-08 NOTE — Telephone Encounter (Signed)
We just increased his Norvasc.  I think we should  Follow for one solid month.  Let's plan to see him back within 1 month if not already scheduled and bring readings and cuff then.

## 2011-06-09 NOTE — Telephone Encounter (Signed)
Pt aware of this. 

## 2011-07-25 ENCOUNTER — Ambulatory Visit (INDEPENDENT_AMBULATORY_CARE_PROVIDER_SITE_OTHER): Payer: Medicare Other | Admitting: Family Medicine

## 2011-07-25 ENCOUNTER — Encounter: Payer: Self-pay | Admitting: Family Medicine

## 2011-07-25 DIAGNOSIS — K4090 Unilateral inguinal hernia, without obstruction or gangrene, not specified as recurrent: Secondary | ICD-10-CM

## 2011-07-25 DIAGNOSIS — E039 Hypothyroidism, unspecified: Secondary | ICD-10-CM

## 2011-07-25 DIAGNOSIS — K219 Gastro-esophageal reflux disease without esophagitis: Secondary | ICD-10-CM

## 2011-07-25 DIAGNOSIS — I10 Essential (primary) hypertension: Secondary | ICD-10-CM

## 2011-07-25 NOTE — Progress Notes (Signed)
  Subjective:    Patient ID: [place], male    DOB: March 08, 1930, 78 y.o.   MRN: [id number]  HPI  Medical followup. Patient has history of hypertension, GERD, remote history of esophageal neoplasm, and hypothyroidism. History of peripheral vascular disease with previous abdominal aortic aneurysm repair. Generally feeling well. Blood pressure fairly well controlled at home. No dizziness. Compliant with medications. Hypothyroidism treated with levothyroxine. Thyroid function normal in August.  Patient has had recurrence of right inguinal hernia. No significant pain. Requesting surgical referral for repeat evaluation.  Past Medical History  Diagnosis Date  . Hypertension   . Chronic kidney disease     kidney stones   . Esophageal cancer 1990  . Hypothyroidism    Past Surgical History  Procedure Date  . Sp endo repair infraren aaa 8/09    es  . Esophageal sec to cancer 1990    reports that he quit smoking about 40 years ago. His smoking use included Cigarettes. He has a 11 pack-year smoking history. He does not have any smokeless tobacco history on file. His alcohol and drug histories not on file. family history includes Cancer in his father. No Known Allergies    Review of Systems  Constitutional: Negative for fatigue.  Eyes: Negative for visual disturbance.  Respiratory: Negative for cough, chest tightness and shortness of breath.   Cardiovascular: Negative for chest pain, palpitations and leg swelling.  Neurological: Negative for dizziness, syncope, weakness, light-headedness and headaches.       Objective:   Physical Exam  Constitutional: He is oriented to person, place, and time. He appears well-developed and well-nourished.  Cardiovascular: Normal rate and regular rhythm.   Murmur heard.      Prominent murmur aortic area previously noted  Pulmonary/Chest: Effort normal and breath sounds normal. No respiratory distress. He has no wheezes. He has no rales.    Musculoskeletal: He exhibits no edema.  Neurological: He is alert and oriented to person, place, and time.          Assessment & Plan:  #1 hypertension. Possible white coat syndrome. Controlled by home readings. Continue close monitoring.  #2 hypothyroidism recently at goal. Continue current medication  #3 recurrent right inguinal hernia. Surgical referral

## 2011-08-20 ENCOUNTER — Encounter (INDEPENDENT_AMBULATORY_CARE_PROVIDER_SITE_OTHER): Payer: Self-pay

## 2011-08-26 ENCOUNTER — Ambulatory Visit (INDEPENDENT_AMBULATORY_CARE_PROVIDER_SITE_OTHER): Payer: Medicare Other | Admitting: Surgery

## 2011-08-26 ENCOUNTER — Encounter (INDEPENDENT_AMBULATORY_CARE_PROVIDER_SITE_OTHER): Payer: Self-pay | Admitting: Surgery

## 2011-08-26 DIAGNOSIS — K4090 Unilateral inguinal hernia, without obstruction or gangrene, not specified as recurrent: Secondary | ICD-10-CM | POA: Insufficient documentation

## 2011-08-26 NOTE — Progress Notes (Signed)
Re:   Isaiah [place] DOB:   April 01, 1930 MRN:   [id number]  ASSESSMENT AND PLAN: 1.  Right inguinal hernia.  Small.  I discussed the indications and complications of hernia surgery with the patient.  I discussed both the laparoscopic and open approach to hernia repair. I don't think he is a candidate for laparoscopic repair because of his prior midline abdominal incision.  The potential risks of hernia surgery include, but are not limited to, bleeding, infection, open surgery, nerve injury, and recurrence of the hernia.  I provided the patient literature about hernia surgery.  He wants to go ahead repair this hernia at this time.  2.  Hypertension. 3.  Chronic kidney disease. 4.  History of esophageal cancer. 80% resection of esophagus. 1997.  Dr. Edwyna Shell.  Disease free. 5.  History of AAA repair, C.Edilia Bo 2009. 5.  On thyroid replacement.  Chief Complaint  Patient presents with  . Pre-op Exam    eval RIH   REFERRING PHYSICIAN: Kristian Covey, MD, MD  HISTORY OF PRESENT ILLNESS: Isaiah [place] is a 78 y.o. (DOB: 22-Sep-1929)  white male whose primary care physician is Kristian Covey, MD, MD and comes to me today for right inguinal hernia.  The patient had a left inguinal hernia repair by Dr. Marcy Panning in February 2006. The patient was confused because he thought Dr. Orson Slick had repaired his right inguinal hernia.  Around November 2012 he started experiencing right inguinal pain. He saw Dr. Evelena Peat who found a right inguinal hernia and referred him to me. He's had no nausea or vomiting. The hernia reduces when he lay supine.    Past Medical History  Diagnosis Date  . Hypertension   . Chronic kidney disease     kidney stones   . Esophageal cancer 1990  . Hypothyroidism   . Inguinal hernia February 2013    right      Past Surgical History  Procedure Date  . Sp endo repair infraren aaa 8/09    es  . Esophageal sec to cancer 1990  . Hernia repair     . Coarctation of aorta repair 2009      Current Outpatient Prescriptions  Medication Sig Dispense Refill  . amLODipine (NORVASC) 5 MG tablet Take 1 tablet (5 mg total) by mouth daily.  30 tablet  11  . aspirin 81 MG tablet Take 81 mg by mouth daily.        Marland Kitchen docusate sodium (COLACE) 100 MG capsule Take 100 mg by mouth 2 (two) times daily.      Marland Kitchen levothyroxine (SYNTHROID, LEVOTHROID) 75 MCG tablet Take 1 tablet (75 mcg total) by mouth daily.  90 tablet  3  . losartan-hydrochlorothiazide (HYZAAR) 100-12.5 MG per tablet Take 1 tablet by mouth daily.  90 tablet  3  . Omega-3 Fatty Acids (FISH OIL) 1200 MG CAPS Take by mouth daily.        Marland Kitchen omeprazole (PRILOSEC) 20 MG capsule Take 1 capsule (20 mg total) by mouth daily.  90 capsule  3   Current Facility-Administered Medications  Medication Dose Route Frequency Provider Last Rate Last Dose  . TDaP (BOOSTRIX) injection 0.5 mL  0.5 mL Intramuscular Once Kristian Covey, MD         No Known Allergies  REVIEW OF SYSTEMS: Skin:  No history of rash.  No history of abnormal moles. Infection:  No history of hepatitis or HIV.  No history of MRSA. Neurologic:  No history of  stroke.  No history of seizure.  No history of headaches. Cardiac:  Hypertension x 4 years. No history of heart disease.  No history of prior cardiac catheterization.  No history of seeing a cardiologist. Pulmonary:  Does not smoke cigarettes.  No asthma or bronchitis.  No OSA/CPAP.  Endocrine:  No diabetes. On thyroid replacement. Gastrointestinal:  History of esophageal cancer - 1997 - disease free. No history of stomach disease.  No history of liver disease.  No history of gall bladder disease.  No history of pancreas disease.  No history of colon disease. Had colonoscopy by Dr. Jarold Motto. Urologic:  History of kidney stones. Musculoskeletal:  No history of joint or back disease. Hematologic:  No bleeding disorder.  No history of anemia.  Not anticoagulated. Psycho-social:   The patient is oriented.   The patient has no obvious psychologic or social impairment to understanding our conversation and plan.  SOCIAL and FAMILY HISTORY: Accompanied wit daughter, Jamelle Rushing.  Married, though wife fairly disabled.  He moves her a lot.  PHYSICAL EXAM: BP 180/62  Pulse 61  Temp(Src) 98.4 F (36.9 C) (Temporal)  Ht 5\' 7"  (1.702 m)  Wt 133 lb 12.8 oz (60.691 kg)  BMI 20.96 kg/m2  SpO2 98%  General: Thin, tan older WM who is alert and generally healthy appearing. He is tan. HEENT: Normal. Pupils equal. Good dentition. Neck: Supple. No mass.  No thyroid mass.  Carotid pulse okay with no bruit. Lymph Nodes:  No supraclavicular or cervical nodes. Lungs: Clear to auscultation and symmetric breath sounds. Heart:  RRR.  Abdomen: Soft. No mass. No tenderness. No hernia. Normal bowel sounds.  Has well healed midline scar. Has small right inguinal hernia.  He has a scar in the left inguinal hernia area consistent with his prior repair. Rectal: Not done. Extremities:  Good strength and ROM  in upper and lower extremities. Neurologic:  Grossly intact to motor and sensory function. Psychiatric: Has normal mood and affect. Behavior is normal.   DATA REVIEWED: Dr. Lucie Leather notes.  Ovidio Kin, MD,  [facility] [address]  [address]   [place], [place]    [address] Phone:  [phone] FAX:  [phone]

## 2011-09-01 DIAGNOSIS — K4090 Unilateral inguinal hernia, without obstruction or gangrene, not specified as recurrent: Secondary | ICD-10-CM

## 2011-09-01 HISTORY — PX: INGUINAL HERNIA REPAIR: SUR1180

## 2011-09-02 ENCOUNTER — Telehealth (INDEPENDENT_AMBULATORY_CARE_PROVIDER_SITE_OTHER): Payer: Self-pay | Admitting: Surgery

## 2011-09-03 ENCOUNTER — Encounter (INDEPENDENT_AMBULATORY_CARE_PROVIDER_SITE_OTHER): Payer: Self-pay | Admitting: Surgery

## 2011-09-03 NOTE — Telephone Encounter (Signed)
I called and gave an appt for 3/27 at 12pm

## 2011-09-17 ENCOUNTER — Ambulatory Visit (INDEPENDENT_AMBULATORY_CARE_PROVIDER_SITE_OTHER): Payer: Medicare Other | Admitting: Surgery

## 2011-09-17 ENCOUNTER — Encounter (INDEPENDENT_AMBULATORY_CARE_PROVIDER_SITE_OTHER): Payer: Self-pay | Admitting: Surgery

## 2011-09-17 DIAGNOSIS — K4090 Unilateral inguinal hernia, without obstruction or gangrene, not specified as recurrent: Secondary | ICD-10-CM

## 2011-09-17 NOTE — Progress Notes (Signed)
Re:   Isaiah Graham:   1929-12-11 MRN:   [id number]  ASSESSMENT AND PLAN: 1.  Right inguinal hernia.   [facility].  Doing well.  Ready to [employer] and plant garden.  I told him to take it easy for a total of 4 weeks from the surgery.  Otherwise he is ready to go.   Return appt PRN.  2.  Hypertension. 3.  Chronic kidney disease. 4.  History of esophageal cancer. 80% resection of esophagus. 1997.  Dr. Edwyna Shell.  Disease free. 5.  History of AAA repair, C.Edilia Bo 2009. 5.  On thyroid replacement.  Chief Complaint  Patient presents with  . Routine Post Op    PO rIH 09/01/11   REFERRING PHYSICIAN: Kristian Covey, MD, MD  HISTORY OF PRESENT ILLNESS: Isaiah Graham is a 78 y.o. (Graham: [birth year])  white male whose primary care physician is Kristian Covey, MD, MD and comes to me today for right inguinal hernia.  The patient had a left inguinal hernia repair by Dr. Marcy Panning in February 2006. The patient was confused because he thought Dr. Orson Slick had repaired his right inguinal hernia.  [facility]  He has done well.    Past Medical History  Diagnosis Date  . Hypertension   . Chronic kidney disease     kidney stones   . Esophageal cancer 1990  . Hypothyroidism   . Inguinal hernia February 2013    right      Past Surgical History  Procedure Date  . Sp endo repair infraren aaa 8/09    es  . Esophageal sec to cancer 1990  . Coarctation of aorta repair 2009  . Hernia repair 09/01/11    [facility]      Current Outpatient Prescriptions  Medication Sig Dispense Refill  . amLODipine (NORVASC) 5 MG tablet Take 1 tablet (5 mg total) by mouth daily.  30 tablet  11  . aspirin 81 MG tablet Take 81 mg by mouth daily.        Marland Kitchen docusate sodium (COLACE) 100 MG capsule Take 100 mg by mouth 2 (two) times daily.      Marland Kitchen levothyroxine (SYNTHROID, LEVOTHROID) 75 MCG tablet Take 1 tablet (75 mcg total) by mouth  daily.  90 tablet  3  . losartan-hydrochlorothiazide (HYZAAR) 100-12.5 MG per tablet Take 1 tablet by mouth daily.  90 tablet  3  . Omega-3 Fatty Acids (FISH OIL) 1200 MG CAPS Take by mouth daily.        Marland Kitchen omeprazole (PRILOSEC) 20 MG capsule Take 1 capsule (20 mg total) by mouth daily.  90 capsule  3   Current Facility-Administered Medications  Medication Dose Route Frequency Provider Last Rate Last Dose  . TDaP (BOOSTRIX) injection 0.5 mL  0.5 mL Intramuscular Once Kristian Covey, MD         No Known Allergies  REVIEW OF SYSTEMS:  Cardiac:  Hypertension x 4 years. No history of heart disease.  No history of prior cardiac catheterization.  No history of seeing a cardiologist. Endocrine:  No diabetes. On thyroid replacement. Gastrointestinal:  History of esophageal cancer - 1997 - disease free. No history of stomach disease.  No history of liver disease.  No history of gall bladder disease.  No history of pancreas disease.  No history of colon disease. Had colonoscopy by Dr. Jarold Motto. Urologic:  History of kidney stones.   SOCIAL and FAMILY HISTORY: Has a  daughter, Jamelle Rushing.  Married, though wife fairly disabled.  He moves her a lot.  PHYSICAL EXAM: BP 160/68  Pulse 72  Temp(Src) 99.2 F (37.3 C) (Temporal)  Resp 18  Ht 5\' 7"  (1.702 m)  Wt 132 lb 6.4 oz (60.056 kg)  BMI 20.74 kg/m2  General: Thin, tan older WM who is alert and generally healthy appearing. He is tan.  Abdomen: Right groin incision well healed.  DATA REVIEWED: Dr. Lucie Leather notes.  Ovidio Kin, MD,  [facility] [address] [place]  [address]   [place], [place]    [address] Phone:  [phone] FAX:  [phone]

## 2011-10-04 ENCOUNTER — Emergency Department (HOSPITAL_COMMUNITY)
Admission: EM | Admit: 2011-10-04 | Discharge: 2011-10-05 | Disposition: A | Discharge status: Home / Self Care | Payer: Medicare Other | Attending: Emergency Medicine | Admitting: Emergency Medicine

## 2011-10-04 ENCOUNTER — Encounter (HOSPITAL_COMMUNITY): Payer: Self-pay | Admitting: *Deleted

## 2011-10-04 DIAGNOSIS — Z79899 Other long term (current) drug therapy: Secondary | ICD-10-CM | POA: Insufficient documentation

## 2011-10-04 DIAGNOSIS — R1319 Other dysphagia: Secondary | ICD-10-CM | POA: Insufficient documentation

## 2011-10-04 DIAGNOSIS — T18108A Unspecified foreign body in esophagus causing other injury, initial encounter: Secondary | ICD-10-CM

## 2011-10-04 DIAGNOSIS — N189 Chronic kidney disease, unspecified: Secondary | ICD-10-CM | POA: Insufficient documentation

## 2011-10-04 DIAGNOSIS — IMO0002 Reserved for concepts with insufficient information to code with codable children: Secondary | ICD-10-CM | POA: Insufficient documentation

## 2011-10-04 DIAGNOSIS — K222 Esophageal obstruction: Secondary | ICD-10-CM | POA: Insufficient documentation

## 2011-10-04 DIAGNOSIS — I129 Hypertensive chronic kidney disease with stage 1 through stage 4 chronic kidney disease, or unspecified chronic kidney disease: Secondary | ICD-10-CM | POA: Insufficient documentation

## 2011-10-04 HISTORY — DX: Abdominal aortic aneurysm, without rupture: I71.4

## 2011-10-04 HISTORY — DX: Abdominal aortic aneurysm, without rupture, unspecified: I71.40

## 2011-10-04 NOTE — ED Notes (Signed)
The pt has had chicken stuck in his esophagus since 1900 tonight.  He has problems in the past

## 2011-10-04 NOTE — ED Notes (Signed)
The pt is unable to swallow his saliva

## 2011-10-05 ENCOUNTER — Encounter (HOSPITAL_COMMUNITY)
Admission: EM | Disposition: A | Discharge status: Home / Self Care | Payer: Self-pay | Source: Home / Self Care | Attending: Emergency Medicine

## 2011-10-05 ENCOUNTER — Encounter (HOSPITAL_COMMUNITY): Payer: Self-pay | Admitting: *Deleted

## 2011-10-05 DIAGNOSIS — K222 Esophageal obstruction: Secondary | ICD-10-CM

## 2011-10-05 DIAGNOSIS — R1319 Other dysphagia: Secondary | ICD-10-CM

## 2011-10-05 DIAGNOSIS — T18108A Unspecified foreign body in esophagus causing other injury, initial encounter: Secondary | ICD-10-CM

## 2011-10-05 HISTORY — PX: ESOPHAGOGASTRODUODENOSCOPY: SHX5428

## 2011-10-05 SURGERY — EGD (ESOPHAGOGASTRODUODENOSCOPY)
Anesthesia: Moderate Sedation

## 2011-10-05 MED ORDER — GLUCAGON HCL (RDNA) 1 MG IJ SOLR
1.0000 mg | Freq: Once | INTRAMUSCULAR | Status: AC
  Administered 2011-10-05: 1 mg via INTRAVENOUS
  Filled 2011-10-05: qty 1

## 2011-10-05 MED ORDER — MIDAZOLAM HCL 10 MG/2ML IJ SOLN
INTRAMUSCULAR | Status: DC | PRN
  Administered 2011-10-05: 2 mg via INTRAVENOUS
  Administered 2011-10-05: 1 mg via INTRAVENOUS

## 2011-10-05 MED ORDER — ONDANSETRON HCL 4 MG/2ML IJ SOLN
4.0000 mg | Freq: Once | INTRAMUSCULAR | Status: AC
  Administered 2011-10-05: 4 mg via INTRAVENOUS
  Filled 2011-10-05: qty 2

## 2011-10-05 MED ORDER — FENTANYL CITRATE 0.05 MG/ML IJ SOLN
25.0000 ug | Freq: Once | INTRAMUSCULAR | Status: AC
  Administered 2011-10-05: 25 ug via INTRAVENOUS
  Filled 2011-10-05: qty 2

## 2011-10-05 MED ORDER — BUTAMBEN-TETRACAINE-BENZOCAINE 2-2-14 % EX AERO
INHALATION_SPRAY | CUTANEOUS | Status: DC | PRN
  Administered 2011-10-05: 2 via TOPICAL

## 2011-10-05 MED ORDER — SODIUM CHLORIDE 0.9 % IV SOLN
Freq: Once | INTRAVENOUS | Status: AC
  Administered 2011-10-05: 500 mL via INTRAVENOUS

## 2011-10-05 MED ORDER — FENTANYL CITRATE 0.05 MG/ML IJ SOLN
INTRAMUSCULAR | Status: AC
  Filled 2011-10-05: qty 2

## 2011-10-05 MED ORDER — DIPHENHYDRAMINE HCL 50 MG/ML IJ SOLN
INTRAMUSCULAR | Status: AC
  Filled 2011-10-05: qty 1

## 2011-10-05 MED ORDER — FENTANYL NICU IV SYRINGE 50 MCG/ML
INJECTION | INTRAMUSCULAR | Status: DC | PRN
  Administered 2011-10-05: 25 ug via INTRAVENOUS
  Administered 2011-10-05: 12.5 ug via INTRAVENOUS

## 2011-10-05 MED ORDER — MIDAZOLAM HCL 10 MG/2ML IJ SOLN
INTRAMUSCULAR | Status: AC
  Filled 2011-10-05: qty 2

## 2011-10-05 NOTE — Interval H&P Note (Signed)
History and Physical Interval Note:  10/05/2011 8:19 AM  Isaiah Graham  has presented today for surgery, with the diagnosis of food impaction  The various methods of treatment have been discussed with the patient and family. After consideration of risks, benefits and other options for treatment, the patient has consented to  Procedure(s) (LRB): ESOPHAGOGASTRODUODENOSCOPY (EGD) (N/A) as a surgical intervention .  The patients' history has been reviewed, patient examined, no change in status, stable for surgery.  I have reviewed the patients' chart and labs.  Questions were answered to the patient's satisfaction.     Venita Lick. Russella Dar MD [name]

## 2011-10-05 NOTE — ED Notes (Signed)
Pt being transported to Endoscopy.  

## 2011-10-05 NOTE — ED Provider Notes (Signed)
History     CSN: [id number]  Arrival date & time 10/04/11  2319   First MD Initiated Contact with Patient 10/05/11 0015      Chief Complaint  Patient presents with  . fb in esophagus     (Consider location/radiation/quality/duration/timing/severity/associated sxs/prior treatment) HPI 78 year old male presents to emergency department with report of vomiting after eating chicken. Patient reports he had chicken meal and around 7 PM swallowed and was able unable to get the chicken to pass through.. Patient has been unable to handle his own secretions since that time. Patient reports history of esophageal resection 10 years ago for cancer. Since that time he has had occasional difficulty with swallowing food. Patient reports he has not been seen by gastroenterology since that time. He has not had prior dilations for any known structures. Patient denies any other complaints at this time Past Medical History  Diagnosis Date  . Hypertension   . Chronic kidney disease     kidney stones   . Esophageal cancer 1990  . Hypothyroidism   . Inguinal hernia February 2013    right    Past Surgical History  Procedure Date  . Sp endo repair infraren aaa 8/09    es  . Esophageal sec to cancer 1990  . Coarctation of aorta repair 2009  . Hernia repair 09/01/11    [facility]    Family History  Problem Relation Age of Onset  . Cancer Father     pancreatic cancer     History  Substance Use Topics  . Smoking status: Former Smoker -- 0.5 packs/day for 22 years    Types: Cigarettes    Quit date: 12/04/1970  . Smokeless tobacco: Not on file  . Alcohol Use: No      Review of Systems  All other systems reviewed and are negative.   other than listed in history of present illness  Allergies  Review of patient's allergies indicates no known allergies.  Home Medications   Current Outpatient Rx  Name Route Sig Dispense Refill  . AMLODIPINE BESYLATE 5 MG PO TABS Oral Take 1 tablet (5 mg total)  by mouth daily. 30 tablet 11  . ASPIRIN 81 MG PO TABS Oral Take 81 mg by mouth daily.      Marland Kitchen DOCUSATE SODIUM 100 MG PO CAPS Oral Take 100 mg by mouth 2 (two) times daily.    Marland Kitchen LEVOTHYROXINE SODIUM 75 MCG PO TABS Oral Take 1 tablet (75 mcg total) by mouth daily. 90 tablet 3  . LOSARTAN POTASSIUM-HCTZ 100-12.5 MG PO TABS Oral Take 1 tablet by mouth daily. 90 tablet 3  . ADULT MULTIVITAMIN W/MINERALS CH Oral Take 1 tablet by mouth daily.    Marland Kitchen FISH OIL 1200 MG PO CAPS Oral Take by mouth daily.      Marland Kitchen OMEPRAZOLE 20 MG PO CPDR Oral Take 1 capsule (20 mg total) by mouth daily. 90 capsule 3    BP 138/88  Pulse 75  Temp(Src) 98.2 F (36.8 C) (Oral)  Resp 16  SpO2 96%  Physical Exam  Nursing note and vitals reviewed. Constitutional: He appears well-developed and well-nourished.       Not distressed however uncomfortable appearing  HENT:  Head: Normocephalic and atraumatic.  Mouth/Throat: Oropharynx is clear and moist.  Eyes: Conjunctivae and EOM are normal. Pupils are equal, round, and reactive to light.  Neck: Normal range of motion. Neck supple.  Cardiovascular: Normal rate, regular rhythm, normal heart sounds and intact distal pulses.  Pulmonary/Chest: Effort normal and breath sounds normal. No respiratory distress. He has no wheezes. He has no rales. He exhibits no tenderness.  Abdominal: Soft. Bowel sounds are normal. He exhibits no distension and no mass. There is no tenderness. There is no rebound and no guarding.  Musculoskeletal: Normal range of motion. He exhibits no edema and no tenderness.  Skin: Skin is warm and dry. No erythema.  Psychiatric: He has a normal mood and affect. His behavior is normal. Judgment and thought content normal.    ED Course  Procedures (including critical care time)  Labs Reviewed - No data to display No results found.   1. Esophageal foreign body       MDM  78 year old male with food bolus impaction. Have attempted carbonated beverage and  glucagon without improvement. Discussed with Dr. Russella Dar with gastroenterology, spoke with him at 2 AM, he reports he will be in at 8 AM to take patient to endoscopy. Patient updated on the plan. Will treat with small amount of sentinel for his discomfort.        Olivia Mackie, MD 10/05/11 [phone]

## 2011-10-05 NOTE — H&P (View-Only) (Signed)
Re:   Isaiah Graham DOB:   [birth year] MRN:   [id number]  ASSESSMENT AND PLAN: 1.  Right inguinal hernia.   [facility].  Doing well.  Ready to [employer] and plant garden.  I told him to take it easy for a total of 4 weeks from the surgery.  Otherwise he is ready to go.   Return appt PRN.  2.  Hypertension. 3.  Chronic kidney disease. 4.  History of esophageal cancer. 80% resection of esophagus. 1997.  Dr. Edwyna Shell.  Disease free. 5.  History of AAA repair, C.Edilia Bo 2009. 5.  On thyroid replacement.  Chief Complaint  Patient presents with  . Routine Post Op    PO rIH 09/01/11   REFERRING PHYSICIAN: Kristian Covey, MD, MD  HISTORY OF PRESENT ILLNESS: Isaiah Graham is a 78 y.o. (DOB: 1930-05-28)  white male whose primary care physician is Kristian Covey, MD, MD and comes to me today for right inguinal hernia.  The patient had a left inguinal hernia repair by Dr. Marcy Panning in February 2006. The patient was confused because he thought Dr. Orson Slick had repaired his right inguinal hernia.  [facility]  He has done well.    Past Medical History  Diagnosis Date  . Hypertension   . Chronic kidney disease     kidney stones   . Esophageal cancer 1990  . Hypothyroidism   . Inguinal hernia February 2013    right      Past Surgical History  Procedure Date  . Sp endo repair infraren aaa 8/09    es  . Esophageal sec to cancer 1990  . Coarctation of aorta repair 2009  . Hernia repair 09/01/11    RIH      Current Outpatient Prescriptions  Medication Sig Dispense Refill  . amLODipine (NORVASC) 5 MG tablet Take 1 tablet (5 mg total) by mouth daily.  30 tablet  11  . aspirin 81 MG tablet Take 81 mg by mouth daily.        Marland Kitchen docusate sodium (COLACE) 100 MG capsule Take 100 mg by mouth 2 (two) times daily.      Marland Kitchen levothyroxine (SYNTHROID, LEVOTHROID) 75 MCG tablet Take 1 tablet (75 mcg total) by mouth  daily.  90 tablet  3  . losartan-hydrochlorothiazide (HYZAAR) 100-12.5 MG per tablet Take 1 tablet by mouth daily.  90 tablet  3  . Omega-3 Fatty Acids (FISH OIL) 1200 MG CAPS Take by mouth daily.        Marland Kitchen omeprazole (PRILOSEC) 20 MG capsule Take 1 capsule (20 mg total) by mouth daily.  90 capsule  3   Current Facility-Administered Medications  Medication Dose Route Frequency Provider Last Rate Last Dose  . TDaP (BOOSTRIX) injection 0.5 mL  0.5 mL Intramuscular Once Kristian Covey, MD         No Known Allergies  REVIEW OF SYSTEMS:  Cardiac:  Hypertension x 4 years. No history of heart disease.  No history of prior cardiac catheterization.  No history of seeing a cardiologist. Endocrine:  No diabetes. On thyroid replacement. Gastrointestinal:  History of esophageal cancer - 1997 - disease free. No history of stomach disease.  No history of liver disease.  No history of gall bladder disease.  No history of pancreas disease.  No history of colon disease. Had colonoscopy by Dr. Jarold Motto. Urologic:  History of kidney stones.   SOCIAL and FAMILY HISTORY: Has a  daughter, Jamelle Rushing.  Married, though wife fairly disabled.  He moves her a lot.  PHYSICAL EXAM: BP 160/68  Pulse 72  Temp(Src) 99.2 F (37.3 C) (Temporal)  Resp 18  Ht 5\' 7"  (1.702 m)  Wt 132 lb 6.4 oz (60.056 kg)  BMI 20.74 kg/m2  General: Thin, tan older WM who is alert and generally healthy appearing. He is tan.  Abdomen: Right groin incision well healed.  DATA REVIEWED: Dr. Lucie Leather notes.  Ovidio Kin, MD,  [facility] [address] [place]  [address]   [place], [place]    [address] Phone:  [phone] FAX:  [phone]

## 2011-10-05 NOTE — Op Note (Signed)
[facility] [address] [place], [place]  [address]  ENDOSCOPY PROCEDURE REPORT PATIENT:  Isaiah, Graham  MR#:  [id number] BIRTHDATE:  05-20-1930, 81 yrs. old  GENDER:  male ENDOSCOPIST:  Judie Petit T. Russella Dar, MD, [facility]  PROCEDURE DATE:  10/05/2011 PROCEDURE:  EGD with foreign body removal ASA CLASS:  Class II INDICATIONS:  food impaction, dysphagia MEDICATIONS:  Fentanyl 37.5 mcg IV, Versed 3 mg IV TOPICAL ANESTHETIC:  Cetacaine Spray DESCRIPTION OF PROCEDURE:   After the risks benefits and alternatives of the procedure were thoroughly explained, informed consent was obtained.  The Pentax Gastroscope [id number] endoscope was introduced through the mouth and advanced to the second portion of the duodenum, without limitations.  The instrument was slowly withdrawn as the mucosa was fully examined. <<PROCEDUREIMAGES>> Retained food was present in the proximal esophagus. A strictured anastomosis was noted at 22 cm in the proximal esophagus. Prior esophagectomy.  After the anastomosis was visualized the food impaction was carefully advanced into the stomach. Otherwise normal esophagus. Post operative changed noted in the proximal stomach. Otherwise normal stomach.  The duodenal bulb was normal in appearance, as was the postbulbar duodenum. Retroflexed views revealed no other abnormalities.  The scope was then withdrawn from the patient and the procedure completed.  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  Venita Lick. Russella Dar, MD, [name]  CC:  Sheryn Bison, MD  n. Rosalie DoctorVenita Lick. Aprile Dickenson at 10/05/2011 08:44 AM  Romie Minus, [phone]

## 2011-10-05 NOTE — Discharge Instructions (Signed)
Endoscopy Care After Dr. Claudette Head [phone]   Esophageal Stricture The esophagus is the long, narrow tube which carries food and liquid from the mouth to the stomach. Sometimes a part of the esophagus becomes narrow and makes it difficult, painful, or even impossible to swallow. This is called an esophageal stricture.  CAUSES  Common causes of blockage or strictures of the esophagus are:  Exposure of the lower esophagus to the acid from the stomach may cause narrowing.   Hiatal hernia in which a small part of the stomach bulges up through the diaphragm can cause a narrowing in the bottom of the esophagus.   Scleroderma is a tissue disorder that affects the esophagus and makes swallowing difficult.   Achalasia is an absence of nerves in the lower esophagus and to the esophageal sphincter. This absence of nerves may be congenital (present since birth). This can cause irregular spasms which do not allow food and fluid through.   Strictures may develop from swallowing materials which damage the esophagus. Examples are acids or alkalis such as lye.   Schatzki's Ring is a narrow ring of non-cancerous tissue which narrows the lower esophagus. The cause of this is unknown.   Growths can block the esophagus.  SYMPTOMS  Some of the problems are difficulty swallowing or pain with swallowing. DIAGNOSIS  Your caregiver often suspects this problem by taking a medical history. They will also do a physical exam. They may then take X-rays and/or perform an endoscopy. Endoscopy is an exam in which a tube like a small flexible telescope is used to look at your esophagus.  TREATMENT  One form of treatment is to dilate the narrow area. This means to stretch it.   When this is not successful, chest surgery may be required. This is a much more extensive form of treatment with a longer recovery time.  Both of the above treatments make the passage of food and water into the stomach easier. They also  make it easier for stomach contents to bubble back into the esophagus. Special medications may be used following the procedure to help prevent further narrowing. Medications may be used to lower the amount of acid in the stomach juice.  SEEK IMMEDIATE MEDICAL CARE IF:   Your swallowing is becoming more painful, difficult, or you are unable to swallow.   You vomit up blood.   You develop black tarry stools.   You develop chills.   You have a fever.   You develop chest or abdominal pain.   You develop shortness of breath, feel lightheaded, or faint.  Follow up with medical care as your caregiver suggests. Document Released: 02/17/2006 Document Revised: 05/29/2011 Document Reviewed: 03/26/2006 [facility] Patient Information 2012 [place], [place].   Please read the instructions outlined below and refer to this sheet in the next few weeks. These discharge instructions provide you with general information on caring for yourself after you leave the hospital. Your doctor may also give you specific instructions. While your treatment has been planned according to the most current medical practices available, unavoidable complications occasionally occur. If you have any problems or questions after discharge, please call your doctor. HOME CARE INSTRUCTIONS Activity  You may resume your regular activity but move at a slower pace for the next 24 hours.   Take frequent rest periods for the next 24 hours.   Walking will help expel (get rid of) the air and reduce the bloated feeling in your abdomen.   No driving for 24 hours (  because of the anesthesia (medicine) used during the test).   You may shower.   Do not sign any important legal documents or operate any machinery for 24 hours (because of the anesthesia used during the test).  Nutrition  Drink plenty of fluids.   You may resume your normal diet.   Begin with a light meal and progress to your normal diet.   Avoid alcoholic beverages for  24 hours or as instructed by your caregiver.  Medications You may resume your normal medications unless your caregiver tells you otherwise. What you can expect today  You may experience abdominal discomfort such as a feeling of fullness or "gas" pains.   You may experience a sore throat for 2 to 3 days. This is normal. Gargling with salt water may help this.  Follow-up Your doctor will discuss the results of your test with you. SEEK IMMEDIATE MEDICAL CARE IF:  You have excessive nausea (feeling sick to your stomach) and/or vomiting.   You have severe abdominal pain and distention (swelling).   You have trouble swallowing.   You have a temperature over 100 F (37.8 C).   You have rectal bleeding or vomiting of blood call MD.

## 2011-10-06 ENCOUNTER — Encounter (HOSPITAL_COMMUNITY): Payer: Self-pay | Admitting: Gastroenterology

## 2011-10-07 ENCOUNTER — Encounter: Payer: Self-pay | Admitting: *Deleted

## 2011-10-09 ENCOUNTER — Encounter: Payer: Self-pay | Admitting: *Deleted

## 2011-10-10 ENCOUNTER — Encounter: Payer: Self-pay | Admitting: Gastroenterology

## 2011-10-10 ENCOUNTER — Ambulatory Visit (INDEPENDENT_AMBULATORY_CARE_PROVIDER_SITE_OTHER): Payer: Medicare Other | Admitting: Gastroenterology

## 2011-10-10 VITALS — BP 162/50 | HR 48 | Ht 67.0 in | Wt 129.4 lb

## 2011-10-10 DIAGNOSIS — R1310 Dysphagia, unspecified: Secondary | ICD-10-CM

## 2011-10-10 DIAGNOSIS — Z8501 Personal history of malignant neoplasm of esophagus: Secondary | ICD-10-CM

## 2011-10-10 NOTE — Progress Notes (Signed)
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 At the time of his surgery he also had poor plasty performed, this had several followup endoscopy is without evidence of previous significant stricturing. In the past he has had evidence of chronic gastroparesis. He is followed a dysphagia diet from any years without difficulties. However, 2 days ago he had impaction of chicken in his esophagus, came to the emergency room, and was endoscoped 10 hours later. Dr. Russella Dar noted an esophageal anastomotic stricture, but no dilation was performed, and he was referred to me today" for further dilations". The patient has been on chronic PPI therapy, and denies chronic GERD. He has not had any recent anorexia, weight loss, hepatobiliary, or lower gastrointestinal problems. He's had multiple surgeries including repair of an abdominal aortic aneurysm, and recently repair of a right inguinal hernia. There's been no evidence of metastatic disease from his previous esophageal cancer. Thorough review of his old chart, records, an endoscopic exams performed. The patient's daughter was with him throughout the interview and examination.  I have reviewed this patient's present history, medical and surgical past history, allergies and medications.     ROS: The remainder of the 10 point ROS is negative.. he has not smoked in over 20 years. He denies angina or other cardiovascular symptoms except for dyspnea and shortness of breath with extreme exertion.     Physical Exam: Blood pressure 162/50, pulse 48, weight 129 pounds with BMI of 20.26. General well developed well nourished patient in no acute distress, appearing his stated age Eyes PERRLA, no icterus, fundoscopic exam per opthamologist Skin no lesions noted Neck supple, no adenopathy, no thyroid enlargement, no tenderness Chest clear to percussion and  auscultation Heart no significant murmurs, gallops or rubs noted Abdomen no hepatosplenomegaly masses or tenderness, BS normal.  Extremities no acute joint lesions, edema, phlebitis or evidence of cellulitis. Neurologic patient oriented x 3, cranial nerves intact, no focal neurologic deficits noted. Psychological mental status normal and normal affect.  Assessment and plan: Anastomotic stricture with a possible element of stricturing from chronic GERD. His endoscopic dilation would be extremely high risk, and I cannot personally proceed with this procedure until I do repeat endoscopy. He may be a candidate for control balloon dilation. I've asked to continue his PPI therapy, and we will repeat his endoscopy in 2 weeks time. Also I reviewed continuation of a dysphagia diet. Otherwise he is to continue medications as per Dr. Caryl Never in primary care. We will copy primary care and also Dr. Karle Plumber in thoracic surgery.  No diagnosis found.

## 2011-10-10 NOTE — Patient Instructions (Signed)
Your procedure has been scheduled for 10/17/2011, please follow the seperate instructions.

## 2011-10-13 ENCOUNTER — Encounter: Payer: Self-pay | Admitting: Gastroenterology

## 2011-10-17 ENCOUNTER — Encounter: Payer: Self-pay | Admitting: Gastroenterology

## 2011-10-17 ENCOUNTER — Ambulatory Visit (AMBULATORY_SURGERY_CENTER): Payer: Medicare Other | Admitting: Gastroenterology

## 2011-10-17 VITALS — BP 168/58 | HR 55 | Temp 97.2°F | Resp 20 | Ht 67.0 in | Wt 129.0 lb

## 2011-10-17 DIAGNOSIS — K222 Esophageal obstruction: Secondary | ICD-10-CM

## 2011-10-17 DIAGNOSIS — R1310 Dysphagia, unspecified: Secondary | ICD-10-CM

## 2011-10-17 DIAGNOSIS — Z8501 Personal history of malignant neoplasm of esophagus: Secondary | ICD-10-CM

## 2011-10-17 MED ORDER — SODIUM CHLORIDE 0.9 % IV SOLN: 500.0000 mL | INTRAVENOUS | Status: DC

## 2011-10-17 NOTE — Progress Notes (Signed)
Patient did not experience any of the following events: a burn prior to discharge; a fall within the facility; wrong site/side/patient/procedure/implant event; or a hospital transfer or hospital admission upon discharge from the facility. (G8907) Patient did not have preoperative order for IV antibiotic SSI prophylaxis. (G8918)  

## 2011-10-17 NOTE — Op Note (Signed)
[facility] [address] [place], [place]  [address]  ENDOSCOPY PROCEDURE REPORT  PATIENT:  Isaiah Graham, Isaiah Graham  MR#:  [id number] BIRTHDATE:  [birth year], 81 yrs. old  GENDER:  male  ENDOSCOPIST:  Vania Rea. Jarold Motto, MD, [facility] Referred by:  Venita Lick. Russella Dar, M.D., [facility]  PROCEDURE DATE:  10/17/2011 PROCEDURE:  Esophagoscopy with Balloon Dilitation ASA CLASS:  Class III INDICATIONS:  PRIOR ESOPHAGECTOMY FOR ESOPHAGEAL CANCER.NOW WITHPROGRESSIVE SOLID FOOD DYSPHAGIA.RECENT IMPACTION RELIEVED BY EGD.  MEDICATIONS:   propofol (Diprivan) 180 mg IV TOPICAL ANESTHETIC:  DESCRIPTION OF PROCEDURE:   After the risks and benefits of the procedure were explained, informed consent was obtained.  The LB GIF-H180 [id number] endoscope was introduced through the mouth and advanced to the second portion of the duodenum.  The instrument was slowly withdrawn as the mucosa was fully examined. <<PROCEDUREIMAGES>>  A stricture was found at the gastroesophageal junction. ANASTOMOTIC BENIGN STRICTURE BALLON DILATED.CRE BOSTON SCIENTIFIC BALLON INFLATED TO AND HELD FOR 2 MTS X 2. Otherwise normal stomach.  Otherwise normal duodenum.    NO MASS SEEN.  The scope was then withdrawn from the patient and the procedure completed.  COMPLICATIONS:  None  ENDOSCOPIC IMPRESSION: 1) Stricture at the gastroesophageal junction RECOMMENDATIONS: 1) Clear liquids until, then soft foods rest iof day. Resume prior diet tomorrow. CALL FOR PROGRESS REPORT NEXT WEEK.  ______________________________ Vania Rea. Jarold Motto, MD, [name]  CC:  n. eSIGNED:   Vania Rea. Kinzlee Selvy at 10/17/2011 01:58 PM  Romie Minus, [phone]

## 2011-10-17 NOTE — Patient Instructions (Signed)

## 2011-10-20 ENCOUNTER — Telehealth: Payer: Self-pay | Admitting: *Deleted

## 2011-10-20 NOTE — Telephone Encounter (Signed)
  Follow up Call-  Call back number 10/17/2011  Post procedure Call Back phone  # [phone]  Permission to leave phone message Yes     Patient questions:  Do you have a fever, pain , or abdominal swelling? no Pain Score  0 *  Have you tolerated food without any problems? yes  Have you been able to return to your normal activities? yes  Do you have any questions about your discharge instructions: Diet   no Medications  no Follow up visit  no  Do you have questions or concerns about your Care? no  Actions: * If pain score is 4 or above: No action needed, pain <4.

## 2012-01-23 ENCOUNTER — Ambulatory Visit (INDEPENDENT_AMBULATORY_CARE_PROVIDER_SITE_OTHER): Payer: Medicare Other | Admitting: Family Medicine

## 2012-01-23 ENCOUNTER — Encounter: Payer: Self-pay | Admitting: Family Medicine

## 2012-01-23 VITALS — BP 150/60 | Temp 98.6°F | Wt 132.0 lb

## 2012-01-23 DIAGNOSIS — K219 Gastro-esophageal reflux disease without esophagitis: Secondary | ICD-10-CM

## 2012-01-23 DIAGNOSIS — E039 Hypothyroidism, unspecified: Secondary | ICD-10-CM

## 2012-01-23 DIAGNOSIS — I10 Essential (primary) hypertension: Secondary | ICD-10-CM

## 2012-01-23 DIAGNOSIS — Z862 Personal history of diseases of the blood and blood-forming organs and certain disorders involving the immune mechanism: Secondary | ICD-10-CM

## 2012-01-23 LAB — CBC WITH DIFFERENTIAL/PLATELET
Eosinophils Relative: 1.1 % (ref 0.0–5.0)
HCT: 35 % — ABNORMAL LOW (ref 39.0–52.0)
Hemoglobin: 11.9 g/dL — ABNORMAL LOW (ref 13.0–17.0)
Lymphs Abs: 1.3 10*3/uL (ref 0.7–4.0)
Monocytes Relative: 8.4 % (ref 3.0–12.0)
Platelets: 179 10*3/uL (ref 150.0–400.0)
WBC: 5.1 10*3/uL (ref 4.5–10.5)

## 2012-01-23 LAB — BASIC METABOLIC PANEL
BUN: 25 mg/dL — ABNORMAL HIGH (ref 6–23)
GFR: 49.74 mL/min — ABNORMAL LOW (ref >60.00)
Potassium: 4.3 mEq/L (ref 3.5–5.1)
Sodium: 139 mEq/L (ref 135–145)

## 2012-01-23 LAB — TSH: TSH: 1.83 u[IU]/mL (ref 0.35–5.50)

## 2012-01-23 MED ORDER — OMEPRAZOLE 20 MG PO CPDR: 20.0000 mg | DELAYED_RELEASE_CAPSULE | Freq: Every day | ORAL | Status: DC

## 2012-01-23 MED ORDER — LOSARTAN POTASSIUM-HCTZ 100-12.5 MG PO TABS: 1.0000 | ORAL_TABLET | Freq: Every day | ORAL | Status: DC

## 2012-01-23 MED ORDER — LEVOTHYROXINE SODIUM 75 MCG PO TABS: 75.0000 ug | ORAL_TABLET | Freq: Every day | ORAL | Status: DC

## 2012-01-23 MED ORDER — AMLODIPINE BESYLATE 5 MG PO TABS: 5.0000 mg | ORAL_TABLET | Freq: Every day | ORAL | Status: DC

## 2012-01-23 NOTE — Progress Notes (Signed)
  Subjective:    Patient ID: Isaiah Graham, male    DOB: 04-Sep-1929, 78 y.o.   MRN: [id number]  HPI  Patient has history of hypertension, GERD with esophageal stricture, remote history of esophageal cancer, hypothyroidism, and peripheral vascular disease with previous abdominal aortic aneurysm repair. Had esophageal stricture dilation several months ago. Still has difficulty eating certain foods such as chicken but overall greatly improved. Appetite and weight are stable. No recent chest pains. No dizziness.  Blood pressure stable by home readings mostly 130/70. Compliant with medications. Hypothyroidism treated with levothyroxine. Overdue for labs.  Past Medical History  Diagnosis Date  . Hypertension   . Chronic kidney disease     kidney stones   . Esophageal cancer 1990  . Hypothyroidism   . Inguinal hernia February 2013    right  . Aortic aneurysm, abdominal     repair  . Esophageal stricture   . Food impaction of esophagus   . Barrett esophagus   . Gastroparesis    Past Surgical History  Procedure Date  . Sp endo repair infraren aaa 8/09    es  . Coarctation of aorta repair 2009  . Inguinal hernia repair 09/01/11    bilateral  . Esophagogastroduodenoscopy 10/05/2011    Procedure: ESOPHAGOGASTRODUODENOSCOPY (EGD);  Surgeon: Meryl Dare, MD,FACG;  Location: [facility] ENDOSCOPY;  Service: Endoscopy;  Laterality: N/A;  . Vagotomy   . Esophagectomy     partial  . Umbilical hernia repair     reports that he quit smoking about 41 years ago. His smoking use included Cigarettes. He has a 11 pack-year smoking history. He has never used smokeless tobacco. He reports that he does not drink alcohol or use illicit drugs. family history includes Alzheimer's disease in his sister; Aneurysm in his daughter; Glaucoma in his sister; and Pancreatic cancer in his father. No Known Allergies    Review of Systems  Constitutional: Negative for fatigue.  Eyes: Negative for visual  disturbance.  Respiratory: Negative for cough, chest tightness and shortness of breath.   Cardiovascular: Negative for chest pain, palpitations and leg swelling.  Neurological: Negative for dizziness, syncope, weakness, light-headedness and headaches.       Objective:   Physical Exam  Constitutional: He is oriented to person, place, and time. He appears well-developed and well-nourished.  HENT:  Mouth/Throat: Oropharynx is clear and moist.  Neck: Neck supple.       Scar left lower neck region from previous skin biopsy per dermatologist. Healing well with no signs of secondary infection  Cardiovascular: Normal rate and regular rhythm.   Pulmonary/Chest: Effort normal and breath sounds normal. No respiratory distress. He has no wheezes. He has no rales.  Musculoskeletal: He exhibits no edema.  Neurological: He is alert and oriented to person, place, and time.          Assessment & Plan:  #1 hypertension. Stable by home readings. Continue close monitoring. Check basic metabolic panel  #2 hypothyroidism. Check TSH  #3 history of GERD with esophageal stricture stable following dilation.

## 2012-01-26 ENCOUNTER — Other Ambulatory Visit: Payer: Self-pay | Admitting: *Deleted

## 2012-01-26 DIAGNOSIS — E538 Deficiency of other specified B group vitamins: Secondary | ICD-10-CM

## 2012-01-26 DIAGNOSIS — D649 Anemia, unspecified: Secondary | ICD-10-CM

## 2012-01-26 NOTE — Progress Notes (Signed)
Quick Note:  Pt informed on cell VM, and will mail labs to his home with instructions highlighted ______

## 2012-02-02 ENCOUNTER — Other Ambulatory Visit (INDEPENDENT_AMBULATORY_CARE_PROVIDER_SITE_OTHER): Payer: Medicare Other

## 2012-02-02 DIAGNOSIS — K219 Gastro-esophageal reflux disease without esophagitis: Secondary | ICD-10-CM

## 2012-02-02 LAB — POC HEMOCCULT BLD/STL (HOME/3-CARD/SCREEN)
Card #2 Fecal Occult Blod, POC: NEGATIVE
Card #3 Fecal Occult Blood, POC: NEGATIVE
Fecal Occult Blood, POC: NEGATIVE

## 2012-02-03 NOTE — Progress Notes (Signed)
Quick Note:  Pt informed ______ 

## 2012-04-06 ENCOUNTER — Telehealth (INDEPENDENT_AMBULATORY_CARE_PROVIDER_SITE_OTHER): Payer: Self-pay

## 2012-04-06 NOTE — Telephone Encounter (Signed)
[name] [place]) calling back regarding an appointment for Isaiah Graham. States she called yesterday and spoke to someone about getting an appointment with Dr. Ezzard Standing ans has not received a call back. I told her I didn't see any open spots and that Huntley Dec or Rivka Barbara could still be working on it from yesterday but that I would send another message to them. She says patient is still in pain at surgery site. Please give her a call back re: appt.

## 2012-04-07 ENCOUNTER — Encounter (INDEPENDENT_AMBULATORY_CARE_PROVIDER_SITE_OTHER): Payer: Medicare Other | Admitting: Surgery

## 2012-04-15 ENCOUNTER — Encounter (INDEPENDENT_AMBULATORY_CARE_PROVIDER_SITE_OTHER): Payer: Self-pay | Admitting: Surgery

## 2012-04-15 ENCOUNTER — Ambulatory Visit (INDEPENDENT_AMBULATORY_CARE_PROVIDER_SITE_OTHER): Payer: Medicare Other | Admitting: Surgery

## 2012-04-15 VITALS — BP 140/70 | HR 60 | Temp 97.9°F | Resp 18 | Ht 67.0 in | Wt 128.0 lb

## 2012-04-15 DIAGNOSIS — K4090 Unilateral inguinal hernia, without obstruction or gangrene, not specified as recurrent: Secondary | ICD-10-CM

## 2012-04-15 NOTE — Progress Notes (Signed)
Re:   Isaiah Graham DOB:   Jan 18, 1930 MRN:   [id number]  ASSESSMENT AND PLAN: 1.  Right inguinal hernia.   [facility].  He is having some localized pain right where his inguinal ligament crosses the femoral artery.  I feel no mass or area of concern.  He does not have a recurrent hernia.  I suggested taking anti-inflammatory meds.  He has tried Aleve with some benefit.  I would do nothing further at this time.  He is to see Dr. Caryl Never in Jan, 2014.  I have left his appt as a PRN.  If he has a continued pain, I would be happy to see him back.  2.  Hypertension. 3.  Chronic kidney disease. 4.  History of esophageal cancer. 80% resection of esophagus. 1997.  Dr. Edwyna Shell.  Disease free. 5.  History of AAA repair, C.Edilia Bo 2009. 5.  On thyroid replacement.  Chief Complaint  Patient presents with  . Follow-up   REFERRING PHYSICIAN: Kristian Covey, MD  HISTORY OF PRESENT ILLNESS: Isaiah Graham is a 78 y.o. (DOB: 1930/01/31)  white male whose primary care physician is Kristian Covey, MD and comes to me today for right inguinal pain about 7 months after a repair.  H[facility]ia [facility].  He noticed some pain about 1 month ago and was worried about a recurrence, though he has never seen a bulge.  His daughter is with him and she thinks that he minimizes his symptoms.  We talked about a CT scan, which at this time, I think is low yield. We talked about anti-inflammatory meds and see how he does.    Past Medical History  Diagnosis Date  . Hypertension   . Chronic kidney disease     kidney stones   . Esophageal cancer 1990  . Hypothyroidism   . Inguinal hernia February 2013    right  . Aortic aneurysm, abdominal     repair  . Esophageal stricture   . Food impaction of esophagus   . Barrett esophagus   . Gastroparesis      Current Outpatient Prescriptions  Medication Sig Dispense Refill  . amLODipine  (NORVASC) 5 MG tablet Take 1 tablet (5 mg total) by mouth daily.  90 tablet  3  . aspirin 81 MG tablet Take 81 mg by mouth daily.        Marland Kitchen docusate sodium (COLACE) 100 MG capsule Take 100 mg by mouth 2 (two) times daily.      Marland Kitchen levothyroxine (SYNTHROID, LEVOTHROID) 75 MCG tablet Take 1 tablet (75 mcg total) by mouth daily.  90 tablet  3  . losartan-hydrochlorothiazide (HYZAAR) 100-12.5 MG per tablet Take 1 tablet by mouth daily.  90 tablet  3  . Multiple Vitamin (MULITIVITAMIN WITH MINERALS) TABS Take 1 tablet by mouth daily.      . Omega-3 Fatty Acids (FISH OIL) 1200 MG CAPS Take by mouth daily.        Marland Kitchen omeprazole (PRILOSEC) 20 MG capsule Take 1 capsule (20 mg total) by mouth daily.  90 capsule  3   Current Facility-Administered Medications  Medication Dose Route Frequency Provider Last Rate Last Dose  . TDaP (BOOSTRIX) injection 0.5 mL  0.5 mL Intramuscular Once Kristian Covey, MD         No Known Allergies  REVIEW OF SYSTEMS:  Cardiac:  Hypertension x 4 years. No history of heart disease.  No history of prior cardiac catheterization.  No history of seeing a cardiologist. Endocrine:  No diabetes. On thyroid replacement. Gastrointestinal:  History of esophageal cancer - 1997 - disease free. No history of stomach disease.  No history of liver disease.  No history of gall bladder disease.  No history of pancreas disease.  No history of colon disease. Had colonoscopy by Dr. Jarold Motto. Urologic:  History of kidney stones.   SOCIAL and FAMILY HISTORY: Has a daughter, Jamelle Rushing.  Married, though wife fairly disabled.  He moves her a lot.  PHYSICAL EXAM: BP 140/70  Pulse 60  Temp 97.9 F (36.6 C) (Oral)  Resp 18  Ht 5\' 7"  (1.702 m)  Wt 128 lb (58.06 kg)  BMI 20.05 kg/m2  General: Thin, tan older WM who is alert and generally healthy appearing. He is tan. Abdomen: Right groin incision well healed.  I feel no mass or know at the sight of pain.  He points to an area where the inguinal  ligament crosses the femoral artery.  DATA REVIEWED: Op notes.  Ovidio Kin, MD,  [facility] [address] [employer]  [address]   [place], [place]    [address] Phone:  [phone] FAX:  [phone]

## 2012-06-30 ENCOUNTER — Ambulatory Visit (INDEPENDENT_AMBULATORY_CARE_PROVIDER_SITE_OTHER): Payer: Medicare Other | Admitting: Family Medicine

## 2012-06-30 ENCOUNTER — Encounter: Payer: Self-pay | Admitting: Family Medicine

## 2012-06-30 VITALS — BP 140/62 | Temp 98.4°F | Wt 134.0 lb

## 2012-06-30 DIAGNOSIS — M25559 Pain in unspecified hip: Secondary | ICD-10-CM

## 2012-06-30 DIAGNOSIS — M169 Osteoarthritis of hip, unspecified: Secondary | ICD-10-CM

## 2012-06-30 DIAGNOSIS — M1611 Unilateral primary osteoarthritis, right hip: Secondary | ICD-10-CM

## 2012-06-30 DIAGNOSIS — M25551 Pain in right hip: Secondary | ICD-10-CM

## 2012-06-30 MED ORDER — LOSARTAN POTASSIUM-HCTZ 100-12.5 MG PO TABS: 1.0000 | ORAL_TABLET | Freq: Every day | ORAL | Status: DC

## 2012-06-30 NOTE — Progress Notes (Addendum)
  Subjective:    Patient ID: Isaiah Graham, male    DOB: [birth year], 79 y.o.   MRN: [id number]  HPI Right anterior hip pain. Onset about 6 months ago. No injury. Worse after prolonged periods of sitting or inactivity. Pain with walking. Tylenol without relief. Aleve helps slightly. Location is anterior to medial right thigh. No back pain. Patient went to see surgeon following inguinal hernia repair and did not see evidence for recurrent hernia. No dysuria. No appetite or weight changes. No claudication symptoms.   Review of Systems  Constitutional: Negative for fever and chills.  Respiratory: Negative for cough and shortness of breath.   Gastrointestinal: Negative for abdominal pain.  Neurological: Negative for weakness and numbness.       Objective:   Physical Exam  Constitutional: He appears well-developed and well-nourished.  Cardiovascular: Normal rate and regular rhythm.   Pulmonary/Chest: Effort normal and breath sounds normal. No respiratory distress. He has no wheezes. He has no rales.  Musculoskeletal:       Right hip reveals restricted range of motion medially and especially laterally compared to left. No localized tenderness. No right inguinal adenopathy. No evidence for recurrent hernia. Full range of motion right knee. Straight leg raise negative. No right lower extremity edema          Assessment & Plan:  Right hip pain. Suspect osteoarthritis. Start with plain films. Consider orthopedic consult. Avoid regular use of nonsteroidals given his age and previous creatinine 1.5.  Severe osteoarthritis right hip.  Pt notified .  Ortho referral.

## 2012-07-02 ENCOUNTER — Ambulatory Visit (INDEPENDENT_AMBULATORY_CARE_PROVIDER_SITE_OTHER)
Admission: RE | Admit: 2012-07-02 | Discharge: 2012-07-02 | Disposition: A | Discharge status: Home / Self Care | Payer: Medicare Other | Source: Ambulatory Visit | Attending: Family Medicine | Admitting: Family Medicine

## 2012-07-02 DIAGNOSIS — M25551 Pain in right hip: Secondary | ICD-10-CM

## 2012-07-02 DIAGNOSIS — M25559 Pain in unspecified hip: Secondary | ICD-10-CM

## 2012-07-04 DIAGNOSIS — M1611 Unilateral primary osteoarthritis, right hip: Secondary | ICD-10-CM | POA: Insufficient documentation

## 2012-07-04 NOTE — Addendum Note (Signed)
Addended by: Kristian Covey on: 07/04/2012 06:05 PM   Modules accepted: Orders

## 2012-07-15 ENCOUNTER — Other Ambulatory Visit: Payer: Self-pay | Admitting: Orthopedic Surgery

## 2012-07-15 MED ORDER — DEXAMETHASONE SODIUM PHOSPHATE 10 MG/ML IJ SOLN: 10.0000 mg | Freq: Once | INTRAMUSCULAR | Status: DC

## 2012-07-15 MED ORDER — BUPIVACAINE LIPOSOME 1.3 % IJ SUSP: 20.0000 mL | Freq: Once | INTRAMUSCULAR | Status: DC

## 2012-07-15 NOTE — Progress Notes (Signed)
Preoperative surgical orders have been place into the [facility] hospital system for Isaiah Graham on 07/15/2012, 12:09 PM  by Patrica Duel for surgery on 08/06/2012.  Preop Total Hip orders including Experel Injecion, IV Tylenol, and IV Decadron as long as there are no contraindications to the above medications. Avel Peace, PA-C

## 2012-07-22 ENCOUNTER — Ambulatory Visit (INDEPENDENT_AMBULATORY_CARE_PROVIDER_SITE_OTHER): Payer: Medicare Other | Admitting: Family Medicine

## 2012-07-22 ENCOUNTER — Ambulatory Visit (INDEPENDENT_AMBULATORY_CARE_PROVIDER_SITE_OTHER)
Admission: RE | Admit: 2012-07-22 | Discharge: 2012-07-22 | Disposition: A | Discharge status: Home / Self Care | Payer: Medicare Other | Source: Ambulatory Visit | Attending: Family Medicine | Admitting: Family Medicine

## 2012-07-22 ENCOUNTER — Encounter: Payer: Self-pay | Admitting: Family Medicine

## 2012-07-22 VITALS — BP 140/60 | Temp 98.0°F | Wt 129.0 lb

## 2012-07-22 DIAGNOSIS — Z01818 Encounter for other preprocedural examination: Secondary | ICD-10-CM

## 2012-07-22 DIAGNOSIS — I10 Essential (primary) hypertension: Secondary | ICD-10-CM

## 2012-07-22 DIAGNOSIS — L89302 Pressure ulcer of unspecified buttock, stage 2: Secondary | ICD-10-CM

## 2012-07-22 DIAGNOSIS — L89309 Pressure ulcer of unspecified buttock, unspecified stage: Secondary | ICD-10-CM

## 2012-07-22 DIAGNOSIS — L8992 Pressure ulcer of unspecified site, stage 2: Secondary | ICD-10-CM

## 2012-07-22 NOTE — Progress Notes (Signed)
  Subjective:    Patient ID: Isaiah Graham, male    DOB: 1929/07/18, 79 y.o.   MRN: [id number]  HPI Patient here for preop physical.  Right total hip replacement. Patient has no cardiac history. Quit smoking many years ago. Generally very active. No recent chest pains. Hypertension which has been fairly well controlled. Other chronic problems include history of GERD, remote history of esophageal cancer, and hypothyroidism. Compliant with medications.  History esophageal stricture with no recent dysphagia.  He has noted some soreness and early skin breakdown left buttock recently. No history of urine or stool incontinence. Generally has had poor appetite over the past several years since his abdominal aortic aneurysm repair.  Past Medical History  Diagnosis Date  . Hypertension   . Chronic kidney disease     kidney stones   . Esophageal cancer 1990  . Hypothyroidism   . Inguinal hernia February 2013    right  . Aortic aneurysm, abdominal     repair  . Esophageal stricture   . Food impaction of esophagus   . Barrett esophagus   . Gastroparesis    Past Surgical History  Procedure Date  . Sp endo repair infraren aaa 8/09    es  . Coarctation of aorta repair 2009  . Inguinal hernia repair 09/01/11    bilateral  . Esophagogastroduodenoscopy 10/05/2011    Procedure: ESOPHAGOGASTRODUODENOSCOPY (EGD);  Surgeon: Meryl Dare, MD,FACG;  Location: [facility] ENDOSCOPY;  Service: Endoscopy;  Laterality: N/A;  . Vagotomy   . Esophagectomy     partial  . Umbilical hernia repair     reports that he quit smoking about 41 years ago. His smoking use included Cigarettes. He has a 11 pack-year smoking history. He has never used smokeless tobacco. He reports that he does not drink alcohol or use illicit drugs. family history includes Alzheimer's disease in his sister; Aneurysm in his daughter; Glaucoma in his sister; and Pancreatic cancer in his father. No Known Allergies    Review of Systems   Constitutional: Negative for chills and unexpected weight change.  Respiratory: Negative for cough and shortness of breath.   Cardiovascular: Negative for chest pain, palpitations and leg swelling.  Gastrointestinal: Negative for abdominal pain.  Genitourinary: Negative for dysuria.  Neurological: Negative for dizziness, syncope, weakness and headaches.       Objective:   Physical Exam  Constitutional: He is oriented to person, place, and time. He appears well-developed and well-nourished.  HENT:  Right Ear: External ear normal.  Left Ear: External ear normal.  Mouth/Throat: Oropharynx is clear and moist.  Neck: Neck supple. No thyromegaly present.       No carotid bruits  Cardiovascular: Normal rate and regular rhythm.   Pulmonary/Chest: Effort normal and breath sounds normal. No respiratory distress. He has no wheezes. He has no rales.  Musculoskeletal: He exhibits no edema.  Lymphadenopathy:    He has no cervical adenopathy.  Neurological: He is alert and oriented to person, place, and time.  Skin:       Patient has some nonspecific mild erythema buttocks bilaterally. Left buttock very early superficial skin breakdown covering area about 1 cm          Assessment & Plan:  Preoperative clearance. Patient has hypertension which has been well controlled. No cardiac history. No concerning symptoms. Check EKG. Patient will get preop labs at hospital. Set up CXR.  He has some early skin breakdown buttock region. We've recommended moisture repellent such as Desitin

## 2012-07-23 NOTE — Progress Notes (Signed)
Quick Note:  Pt wife informed ______ 

## 2012-07-27 ENCOUNTER — Encounter (HOSPITAL_COMMUNITY): Payer: Self-pay | Admitting: Pharmacy Technician

## 2012-07-28 ENCOUNTER — Inpatient Hospital Stay (HOSPITAL_COMMUNITY): Admission: RE | Admit: 2012-07-28 | Payer: Medicare Other | Source: Ambulatory Visit

## 2012-07-30 ENCOUNTER — Ambulatory Visit (HOSPITAL_COMMUNITY)
Admission: RE | Admit: 2012-07-30 | Discharge: 2012-07-30 | Disposition: A | Discharge status: Home / Self Care | Payer: Medicare Other | Source: Ambulatory Visit | Attending: Orthopedic Surgery | Admitting: Orthopedic Surgery

## 2012-07-30 ENCOUNTER — Ambulatory Visit: Payer: Medicare Other | Admitting: Family Medicine

## 2012-07-30 ENCOUNTER — Encounter (HOSPITAL_COMMUNITY)
Admission: RE | Admit: 2012-07-30 | Discharge: 2012-07-30 | Disposition: A | Discharge status: Home / Self Care | Payer: Medicare Other | Source: Ambulatory Visit | Attending: Orthopedic Surgery | Admitting: Orthopedic Surgery

## 2012-07-30 ENCOUNTER — Encounter (HOSPITAL_COMMUNITY): Payer: Self-pay

## 2012-07-30 DIAGNOSIS — M51379 Other intervertebral disc degeneration, lumbosacral region without mention of lumbar back pain or lower extremity pain: Secondary | ICD-10-CM | POA: Insufficient documentation

## 2012-07-30 DIAGNOSIS — M76899 Other specified enthesopathies of unspecified lower limb, excluding foot: Secondary | ICD-10-CM | POA: Insufficient documentation

## 2012-07-30 DIAGNOSIS — Z01812 Encounter for preprocedural laboratory examination: Secondary | ICD-10-CM | POA: Insufficient documentation

## 2012-07-30 DIAGNOSIS — M5137 Other intervertebral disc degeneration, lumbosacral region: Secondary | ICD-10-CM | POA: Insufficient documentation

## 2012-07-30 DIAGNOSIS — M899 Disorder of bone, unspecified: Secondary | ICD-10-CM | POA: Insufficient documentation

## 2012-07-30 DIAGNOSIS — Z01818 Encounter for other preprocedural examination: Secondary | ICD-10-CM | POA: Insufficient documentation

## 2012-07-30 HISTORY — DX: Unspecified osteoarthritis, unspecified site: M19.90

## 2012-07-30 LAB — SURGICAL PCR SCREEN: MRSA, PCR: INVALID — AB

## 2012-07-30 LAB — URINALYSIS, ROUTINE W REFLEX MICROSCOPIC
Glucose, UA: NEGATIVE mg/dL
Leukocytes, UA: NEGATIVE
Protein, ur: NEGATIVE mg/dL
Urobilinogen, UA: 0.2 mg/dL (ref 0.0–1.0)

## 2012-07-30 LAB — COMPREHENSIVE METABOLIC PANEL
Albumin: 3.9 g/dL (ref 3.5–5.2)
Alkaline Phosphatase: 90 U/L (ref 39–117)
BUN: 32 mg/dL — ABNORMAL HIGH (ref 6–23)
CO2: 24 mEq/L (ref 19–32)
Chloride: 102 mEq/L (ref 96–112)
GFR calc Af Amer: 54 mL/min — ABNORMAL LOW (ref >90)
GFR calc non Af Amer: 46 mL/min — ABNORMAL LOW (ref >90)
Glucose, Bld: 102 mg/dL — ABNORMAL HIGH (ref 70–99)
Potassium: 4.3 mEq/L (ref 3.5–5.1)
Total Bilirubin: 0.6 mg/dL (ref 0.3–1.2)

## 2012-07-30 LAB — CBC
HCT: 38.3 % — ABNORMAL LOW (ref 39.0–52.0)
Hemoglobin: 12.7 g/dL — ABNORMAL LOW (ref 13.0–17.0)
MCV: 88.5 fL (ref 78.0–100.0)
RBC: 4.33 MIL/uL (ref 4.22–5.81)
WBC: 6.3 10*3/uL (ref 4.0–10.5)

## 2012-07-30 LAB — APTT: aPTT: 36 seconds (ref 24–37)

## 2012-07-30 NOTE — Progress Notes (Signed)
Faxed abnormal CMET to Dr Lequita Halt through [facility]

## 2012-07-30 NOTE — Progress Notes (Signed)
07/30/12 1041  OBSTRUCTIVE SLEEP APNEA  Have you ever been diagnosed with sleep apnea through a sleep study? No  Do you snore loudly (loud enough to be heard through closed doors)?  0  Do you often feel tired, fatigued, or sleepy during the daytime? 1  Has anyone observed you stop breathing during your sleep? 0  Do you have, or are you being treated for high blood pressure? 1  BMI more than 35 kg/m2? 0  Age over 77 years old? 1  Neck circumference greater than 40 cm/18 inches? 0  Gender: 1  Obstructive Sleep Apnea Score 4   Score 4 or greater  Results sent to PCP

## 2012-07-30 NOTE — Progress Notes (Signed)
Per patients daughters request, verified with HOLLY in OR that BIZ monitor is used on all anesthesia  cases

## 2012-07-30 NOTE — Progress Notes (Signed)
Received fax from Dr Lequita Halt- no action re labs

## 2012-07-30 NOTE — Patient Instructions (Addendum)
20 [name]  07/30/2012   Your procedure is scheduled on:  08/06/12  FRIDAY  Report to [facility] at 0700      AM.  Call this number if you have problems the morning of surgery: [phone]       Remember:   Do not eat food  Or drink :After Midnight.THURSDAY NIGHT   Take these medicines the morning of surgery with A SIP OF WATER: OMEPRAZOLE, LEVOTHYROXINE   .  Contacts, dentures or partial plates can not be worn to surgery  Leave suitcase in the car. After surgery it may be brought to your room.  For patients admitted to the hospital, checkout time is 11:00 AM day of  discharge.             SPECIAL INSTRUCTIONS- SEE [place] PREPARING FOR SURGERY INSTRUCTION SHEET-     DO NOT WEAR JEWELRY, LOTIONS, POWDERS, OR PERFUMES.  WOMEN-- DO NOT SHAVE LEGS OR UNDERARMS FOR 12 HOURS BEFORE SHOWERS. MEN MAY SHAVE FACE.  Patients discharged the day of surgery will not be allowed to drive home. IF going home the day of surgery, you must have a driver and someone to stay with you for the first 24 hours  Name and phone number of your driver:       Daughter  [name]                                                                 Please read over the following fact sheets that you were given: MRSA Information, Incentive Spirometry Sheet, Blood Transfusion Sheet  Information                                                                                   Isaiah Graham  PST 336  [phone]                 FAILURE TO FOLLOW THESE INSTRUCTIONS MAY RESULT IN  CANCELLATION   OF YOUR SURGERY                                                  Patient Signature _____________________________

## 2012-07-30 NOTE — Progress Notes (Signed)
Clearance Dr Caryl Never chart,  Chest 1/14 EPIC, EKG 1/14 EP[facility]

## 2012-08-03 ENCOUNTER — Other Ambulatory Visit: Payer: Self-pay | Admitting: Orthopedic Surgery

## 2012-08-03 NOTE — H&P (Signed)
Isaiah Graham  DOB: 04/02/1930 Married / Language: English / Race: White Male  Date of Admission:  08/06/2012  Chief Complaint:  Right Hip Pain  History of Present Illness The patient is a 79 year old male who comes in for a preoperative History and Physical. The patient is scheduled for a right total hip arthroplasty to be performed by Dr. Frank V. Aluisio, MD at [facility] on 08/06/2012. The patient is a 79 year old male who presents with a hip problem. The patient was seen in referral from Dr. Burchette.The patient reports right anterior hip problems including pain symptoms that have been present for at least 5 months. The symptoms began without any known injury. Symptoms reported include difficulty flexing hip and difficulty rotating hip The patient describes the hip problem as sharp.The symptoms are described as mild.The patient feels as if their symptoms are does feel they are worsening. Symptoms are exacerbated by flexing hip and walking up stairs. He does have x-rays on the [facility] system from Dr. Burchettes office. He had an inguinal hernia that he had operated on. He was unsure if the pain was coming from the hip or the hernia. The last 5 months the pain has been worse. He has pain at all times, worse with weightbearing. He reports that he is not having any trouble with the left hip. He is an active person but has found this to limit his activity level. Unfortunately, his hip is bothering him at all times. It is limiting what he can and cannot do. He can barely walk anymore. It's even hurting him at night. He recently saw Dr. Burchette and was told that the arthritis was severe. He is ready to proceed with surgery. They have been treated conservatively in the past for the above stated problem and despite conservative measures, they continue to have progressive pain and severe functional limitations and dysfunction. They have failed non-operative management  including home exercise, medications. It is felt that they would benefit from undergoing total joint replacement. Risks and benefits of the procedure have been discussed with the patient and they elect to proceed with surgery. There are no active contraindications to surgery such as ongoing infection or rapidly progressive neurological disease.  Problem List Osteoarthritis, Hip (715.35)  Allergies Morphine Derivatives. Sedation   Family History Cerebrovascular Accident. grandfather mothers side Diabetes Mellitus. sister Cancer. father and brother   Social History Drug/Alcohol Rehab (Previously). no Exercise. Exercises daily; does individual sport Illicit drug use. no Children. 4 Current work status. retired Drug/Alcohol Rehab (Currently). no Pain Contract. no Tobacco use. former smoker Living situation. live with spouse Marital status. married Number of flights of stairs before winded. greater than 5 Alcohol use. never consumed alcohol Post-Surgical Plans. Plan is to go home. Advance Directives. Living Will, Heathcare POA   Medication History Norvasc (5MG Tablet, Oral) Active. Aspirin EC (81MG Tablet DR, Oral) Active. Colace (100MG Capsule, Oral) Active. Synthroid (75MCG Tablet, Oral) Active. Hyzaar (100-12.5MG Tablet, Oral) Active. Multivitamin ( Oral) Active. Omega 3 (1000MG Capsule, Oral) Active. PriLOSEC (20MG Capsule DR, Oral) Active.   Past Surgical History Inguinal Hernia Repair. 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   Skin:Not Present- Hives, Itching, Rash, Eczema and Lesions. HEENT:Not Present- Tinnitus, Headache, Double Vision, Visual Loss, Hearing Loss and Dentures. Respiratory:Not Present- Shortness of breath with exertion, Shortness of breath at rest, Allergies, Coughing up blood and Chronic Cough. Cardiovascular:Not Present- Chest Pain, Racing/skipping heartbeats, Difficulty Breathing Lying Down, Murmur, Swelling and Palpitations. Gastrointestinal:Not Present- Bloody Stool, Heartburn, Abdominal Pain, Vomiting, Nausea, Constipation, Diarrhea, Difficulty Swallowing, Jaundice and Loss of appetitie. Male Genitourinary:Not Present- Urinary frequency, Blood in Urine, Weak urinary stream, Discharge, Flank Pain, Incontinence, Painful Urination, Urgency, Urinary Retention and Urinating at Night. Musculoskeletal:Not Present- Muscle Weakness, Muscle Pain, Joint Swelling, Joint Pain, Back Pain, Morning Stiffness and Spasms. Neurological:Not Present- Tremor, Dizziness, Blackout spells, Paralysis, Difficulty with balance and Weakness. Psychiatric:Not Present- Insomnia.   Vitals Weight: 125 lb Height: 67 in Weight was reported by patient. Height was reported by patient. Body Surface Area: 1.64 m Body Mass Index: 19.58 kg/m Pulse: 64 (Regular) Resp.: 12 (Unlabored) BP: 142/54 (Sitting, Right Arm, Standard)    Physical Exam The physical exam findings are as follows:   General Mental Status - Alert, cooperative and good historian. General Appearance- pleasant. Not in acute distress. Orientation- Oriented X3. Build & Nutrition- Lean, Well nourished and Well developed.   Head and Neck Head- normocephalic, atraumatic . Neck Global Assessment- supple. no bruit auscultated on the right and no bruit auscultated on the left. Upper dentures  Eye Pupil- Bilateral- Regular and Round. Motion- Bilateral- EOMI.   Chest and Lung Exam Auscultation: Breath  sounds:- clear at anterior chest wall and - clear at posterior chest wall. Adventitious sounds:- No Adventitious sounds.   Cardiovascular Auscultation:Rhythm- Regular rate and rhythm (with occassional pause or skipped beat on auscultation.). Heart Sounds- S1 WNL and S2 WNL. Murmurs & Other Heart Sounds:Auscultation of the heart reveals - No Murmurs.   Abdomen Palpation/Percussion:Tenderness- Abdomen is non-tender to palpation. Rigidity (guarding)- Abdomen is soft. Auscultation:Auscultation of the abdomen reveals - Bowel sounds normal.   Male Genitourinary  Not done, not pertinent to present illness  Musculoskeletal On exam, he is a very pleasant, well-developed male, alert and oriented, in no apparent distress. His left hip ROM is flexion 120, rotation in 30, out 40, abduction 40 without discomfort. Right hip flexion 90, no internal or external rotation, and essentially no abduction. He has pain on any attempt at ROM of the right hip.  RADIOGRAPHS: AP pelvis and lateral of the right hip show severe end stage arthritis of the right hip, bone on bone with large protrusio deformity and complete loss of acetabular cartilage.  Assessment & Plan Osteoarthritis, Hip (715.35) Impression: Right Hip  Note: Plan is for a Right Total Hip Replacement with Acetabular Grafting by Dr. Aluisio.  Plan is to go home.  PCP - Dr. Bruce Burchette - Patient has been seen preoperatively and felt to be stable for surgery.  Signed electronically by DREW L Eeva Schlosser, PA-C  

## 2012-08-04 ENCOUNTER — Ambulatory Visit: Payer: Medicare Other | Admitting: Family Medicine

## 2012-08-06 ENCOUNTER — Encounter (HOSPITAL_COMMUNITY): Payer: Self-pay | Admitting: Anesthesiology

## 2012-08-06 ENCOUNTER — Inpatient Hospital Stay (HOSPITAL_COMMUNITY): Payer: Medicare Other

## 2012-08-06 ENCOUNTER — Inpatient Hospital Stay (HOSPITAL_COMMUNITY): Payer: Medicare Other | Admitting: Anesthesiology

## 2012-08-06 ENCOUNTER — Inpatient Hospital Stay (HOSPITAL_COMMUNITY)
Admission: RE | Admit: 2012-08-06 | Discharge: 2012-08-10 | DRG: 470 | Disposition: A | Discharge status: Skilled Nursing Facility | Payer: Medicare Other | Source: Ambulatory Visit | Attending: Orthopedic Surgery | Admitting: Orthopedic Surgery

## 2012-08-06 ENCOUNTER — Encounter (HOSPITAL_COMMUNITY)
Admission: RE | Disposition: A | Discharge status: Skilled Nursing Facility | Payer: Self-pay | Source: Ambulatory Visit | Attending: Orthopedic Surgery

## 2012-08-06 DIAGNOSIS — Z8501 Personal history of malignant neoplasm of esophagus: Secondary | ICD-10-CM

## 2012-08-06 DIAGNOSIS — Z87442 Personal history of urinary calculi: Secondary | ICD-10-CM

## 2012-08-06 DIAGNOSIS — I10 Essential (primary) hypertension: Secondary | ICD-10-CM | POA: Diagnosis present

## 2012-08-06 DIAGNOSIS — Z9289 Personal history of other medical treatment: Secondary | ICD-10-CM

## 2012-08-06 DIAGNOSIS — L8991 Pressure ulcer of unspecified site, stage 1: Secondary | ICD-10-CM | POA: Diagnosis not present

## 2012-08-06 DIAGNOSIS — E039 Hypothyroidism, unspecified: Secondary | ICD-10-CM | POA: Diagnosis present

## 2012-08-06 DIAGNOSIS — M1610 Unilateral primary osteoarthritis, unspecified hip: Principal | ICD-10-CM | POA: Diagnosis present

## 2012-08-06 DIAGNOSIS — Z9089 Acquired absence of other organs: Secondary | ICD-10-CM

## 2012-08-06 DIAGNOSIS — Z79899 Other long term (current) drug therapy: Secondary | ICD-10-CM

## 2012-08-06 DIAGNOSIS — E7800 Pure hypercholesterolemia, unspecified: Secondary | ICD-10-CM | POA: Diagnosis present

## 2012-08-06 DIAGNOSIS — Z885 Allergy status to narcotic agent status: Secondary | ICD-10-CM

## 2012-08-06 DIAGNOSIS — M21859 Other specified acquired deformities of unspecified thigh: Secondary | ICD-10-CM | POA: Diagnosis present

## 2012-08-06 DIAGNOSIS — M169 Osteoarthritis of hip, unspecified: Secondary | ICD-10-CM | POA: Diagnosis present

## 2012-08-06 DIAGNOSIS — Z87891 Personal history of nicotine dependence: Secondary | ICD-10-CM

## 2012-08-06 DIAGNOSIS — L89151 Pressure ulcer of sacral region, stage 1: Secondary | ICD-10-CM

## 2012-08-06 DIAGNOSIS — K219 Gastro-esophageal reflux disease without esophagitis: Secondary | ICD-10-CM | POA: Diagnosis present

## 2012-08-06 DIAGNOSIS — E871 Hypo-osmolality and hyponatremia: Secondary | ICD-10-CM | POA: Diagnosis not present

## 2012-08-06 DIAGNOSIS — R339 Retention of urine, unspecified: Secondary | ICD-10-CM | POA: Diagnosis not present

## 2012-08-06 DIAGNOSIS — D62 Acute posthemorrhagic anemia: Secondary | ICD-10-CM

## 2012-08-06 DIAGNOSIS — L89109 Pressure ulcer of unspecified part of back, unspecified stage: Secondary | ICD-10-CM | POA: Diagnosis not present

## 2012-08-06 DIAGNOSIS — Z7982 Long term (current) use of aspirin: Secondary | ICD-10-CM

## 2012-08-06 HISTORY — PX: TOTAL HIP ARTHROPLASTY: SHX124

## 2012-08-06 SURGERY — ARTHROPLASTY, HIP, TOTAL,POSTERIOR APPROACH
Anesthesia: General | Site: Hip | Laterality: Right | Wound class: Clean

## 2012-08-06 MED ORDER — ACETAMINOPHEN 325 MG PO TABS: 650.0000 mg | ORAL_TABLET | Freq: Four times a day (QID) | ORAL | Status: DC | PRN

## 2012-08-06 MED ORDER — LEVOTHYROXINE SODIUM 75 MCG PO TABS
75.0000 ug | ORAL_TABLET | Freq: Every day | ORAL | Status: DC
  Administered 2012-08-07 – 2012-08-10 (×4): 75 ug via ORAL
  Filled 2012-08-06 (×5): qty 1

## 2012-08-06 MED ORDER — TRAMADOL HCL 50 MG PO TABS
50.0000 mg | ORAL_TABLET | Freq: Four times a day (QID) | ORAL | Status: DC | PRN
  Administered 2012-08-08: 50 mg via ORAL
  Filled 2012-08-06: qty 1

## 2012-08-06 MED ORDER — SODIUM CHLORIDE 0.9 % IV SOLN: INTRAVENOUS | Status: DC

## 2012-08-06 MED ORDER — METOCLOPRAMIDE HCL 5 MG/ML IJ SOLN: 10.0000 mg | Freq: Once | INTRAMUSCULAR | Status: DC | PRN

## 2012-08-06 MED ORDER — DOCUSATE SODIUM 100 MG PO CAPS
100.0000 mg | ORAL_CAPSULE | Freq: Two times a day (BID) | ORAL | Status: DC
  Administered 2012-08-06 – 2012-08-10 (×9): 100 mg via ORAL

## 2012-08-06 MED ORDER — OXYCODONE HCL 5 MG/5ML PO SOLN
5.0000 mg | Freq: Once | ORAL | Status: DC | PRN
  Filled 2012-08-06: qty 5

## 2012-08-06 MED ORDER — PHENOL 1.4 % MT LIQD
1.0000 | OROMUCOSAL | Status: DC | PRN
  Filled 2012-08-06: qty 177

## 2012-08-06 MED ORDER — LACTATED RINGERS IV SOLN
INTRAVENOUS | Status: DC | PRN
  Administered 2012-08-06 (×2): via INTRAVENOUS

## 2012-08-06 MED ORDER — NEOSTIGMINE METHYLSULFATE 1 MG/ML IJ SOLN
INTRAMUSCULAR | Status: DC | PRN
  Administered 2012-08-06: 2.5 mg via INTRAVENOUS

## 2012-08-06 MED ORDER — LIDOCAINE HCL (CARDIAC) 20 MG/ML IV SOLN
INTRAVENOUS | Status: DC | PRN
  Administered 2012-08-06: 40 mg via INTRAVENOUS

## 2012-08-06 MED ORDER — METHOCARBAMOL 500 MG PO TABS
500.0000 mg | ORAL_TABLET | Freq: Four times a day (QID) | ORAL | Status: DC | PRN
  Administered 2012-08-08 – 2012-08-09 (×3): 500 mg via ORAL
  Filled 2012-08-06 (×4): qty 1

## 2012-08-06 MED ORDER — METOCLOPRAMIDE HCL 10 MG PO TABS
5.0000 mg | ORAL_TABLET | Freq: Three times a day (TID) | ORAL | Status: DC | PRN
  Administered 2012-08-09: 10 mg via ORAL
  Filled 2012-08-06: qty 1

## 2012-08-06 MED ORDER — ONDANSETRON HCL 4 MG/2ML IJ SOLN
INTRAMUSCULAR | Status: DC | PRN
  Administered 2012-08-06: 4 mg via INTRAVENOUS

## 2012-08-06 MED ORDER — ACETAMINOPHEN 10 MG/ML IV SOLN
1000.0000 mg | Freq: Once | INTRAVENOUS | Status: AC
  Administered 2012-08-06: 1000 mg via INTRAVENOUS

## 2012-08-06 MED ORDER — ONDANSETRON HCL 4 MG PO TABS: 4.0000 mg | ORAL_TABLET | Freq: Four times a day (QID) | ORAL | Status: DC | PRN

## 2012-08-06 MED ORDER — PANTOPRAZOLE SODIUM 40 MG PO TBEC
40.0000 mg | DELAYED_RELEASE_TABLET | Freq: Every day | ORAL | Status: DC
  Administered 2012-08-06 – 2012-08-10 (×5): 40 mg via ORAL
  Filled 2012-08-06 (×5): qty 1

## 2012-08-06 MED ORDER — LOSARTAN POTASSIUM-HCTZ 100-12.5 MG PO TABS: 1.0000 | ORAL_TABLET | Freq: Every day | ORAL | Status: DC

## 2012-08-06 MED ORDER — AMLODIPINE BESYLATE 5 MG PO TABS
5.0000 mg | ORAL_TABLET | Freq: Every evening | ORAL | Status: DC
  Administered 2012-08-06 – 2012-08-09 (×3): 5 mg via ORAL
  Filled 2012-08-06 (×5): qty 1

## 2012-08-06 MED ORDER — PHENYLEPHRINE HCL 10 MG/ML IJ SOLN
10.0000 mg | INTRAVENOUS | Status: DC | PRN
  Administered 2012-08-06: 10 ug/min via INTRAVENOUS

## 2012-08-06 MED ORDER — METHOCARBAMOL 100 MG/ML IJ SOLN
500.0000 mg | Freq: Four times a day (QID) | INTRAVENOUS | Status: DC | PRN
  Administered 2012-08-06: 500 mg via INTRAVENOUS
  Filled 2012-08-06: qty 5

## 2012-08-06 MED ORDER — BISACODYL 10 MG RE SUPP
10.0000 mg | Freq: Every day | RECTAL | Status: DC | PRN
  Administered 2012-08-09: 10 mg via RECTAL
  Filled 2012-08-06: qty 1

## 2012-08-06 MED ORDER — HYDROMORPHONE HCL PF 1 MG/ML IJ SOLN
INTRAMUSCULAR | Status: AC
  Administered 2012-08-06: 0.5 mg via INTRAVENOUS
  Filled 2012-08-06: qty 1

## 2012-08-06 MED ORDER — SODIUM CHLORIDE 0.9 % IV SOLN
INTRAVENOUS | Status: DC
  Administered 2012-08-06: 15:00:00 via INTRAVENOUS

## 2012-08-06 MED ORDER — LOSARTAN POTASSIUM 50 MG PO TABS
100.0000 mg | ORAL_TABLET | Freq: Every day | ORAL | Status: DC
  Administered 2012-08-07 – 2012-08-10 (×4): 100 mg via ORAL
  Filled 2012-08-06 (×5): qty 2

## 2012-08-06 MED ORDER — MENTHOL 3 MG MT LOZG
1.0000 | LOZENGE | OROMUCOSAL | Status: DC | PRN
  Filled 2012-08-06: qty 9

## 2012-08-06 MED ORDER — CHLORHEXIDINE GLUCONATE 4 % EX LIQD
60.0000 mL | Freq: Once | CUTANEOUS | Status: DC
  Filled 2012-08-06: qty 60

## 2012-08-06 MED ORDER — DIPHENHYDRAMINE HCL 12.5 MG/5ML PO ELIX: 12.5000 mg | ORAL_SOLUTION | ORAL | Status: DC | PRN

## 2012-08-06 MED ORDER — FENTANYL CITRATE 0.05 MG/ML IJ SOLN
INTRAMUSCULAR | Status: DC | PRN
  Administered 2012-08-06: 25 ug via INTRAVENOUS
  Administered 2012-08-06 (×3): 50 ug via INTRAVENOUS

## 2012-08-06 MED ORDER — GLYCOPYRROLATE 0.2 MG/ML IJ SOLN
INTRAMUSCULAR | Status: DC | PRN
  Administered 2012-08-06: 0.4 mg via INTRAVENOUS
  Administered 2012-08-06: 0.2 mg via INTRAVENOUS

## 2012-08-06 MED ORDER — OXYCODONE HCL 5 MG PO TABS: 5.0000 mg | ORAL_TABLET | Freq: Once | ORAL | Status: DC | PRN

## 2012-08-06 MED ORDER — POLYETHYLENE GLYCOL 3350 17 G PO PACK
17.0000 g | PACK | Freq: Every day | ORAL | Status: DC | PRN
  Administered 2012-08-09: 17 g via ORAL

## 2012-08-06 MED ORDER — ROCURONIUM BROMIDE 100 MG/10ML IV SOLN
INTRAVENOUS | Status: DC | PRN
  Administered 2012-08-06: 35 mg via INTRAVENOUS
  Administered 2012-08-06: 5 mg via INTRAVENOUS

## 2012-08-06 MED ORDER — ACETAMINOPHEN 650 MG RE SUPP: 650.0000 mg | Freq: Four times a day (QID) | RECTAL | Status: DC | PRN

## 2012-08-06 MED ORDER — HYDROMORPHONE HCL PF 1 MG/ML IJ SOLN
0.2500 mg | INTRAMUSCULAR | Status: DC | PRN
  Administered 2012-08-06 (×2): 0.5 mg via INTRAVENOUS

## 2012-08-06 MED ORDER — HYDROMORPHONE HCL PF 1 MG/ML IJ SOLN
0.5000 mg | INTRAMUSCULAR | Status: DC | PRN
  Administered 2012-08-06: 0.5 mg via INTRAVENOUS

## 2012-08-06 MED ORDER — EPHEDRINE SULFATE 50 MG/ML IJ SOLN
INTRAMUSCULAR | Status: DC | PRN
  Administered 2012-08-06: 5 mg via INTRAVENOUS

## 2012-08-06 MED ORDER — FLEET ENEMA 7-19 GM/118ML RE ENEM: 1.0000 | ENEMA | Freq: Once | RECTAL | Status: AC | PRN

## 2012-08-06 MED ORDER — ONDANSETRON HCL 4 MG/2ML IJ SOLN
4.0000 mg | Freq: Four times a day (QID) | INTRAMUSCULAR | Status: DC | PRN
  Administered 2012-08-06 – 2012-08-07 (×2): 4 mg via INTRAVENOUS
  Filled 2012-08-06 (×2): qty 2

## 2012-08-06 MED ORDER — RIVAROXABAN 10 MG PO TABS
10.0000 mg | ORAL_TABLET | Freq: Every day | ORAL | Status: DC
  Administered 2012-08-07 – 2012-08-10 (×4): 10 mg via ORAL
  Filled 2012-08-06 (×5): qty 1

## 2012-08-06 MED ORDER — OXYCODONE HCL 5 MG PO TABS
5.0000 mg | ORAL_TABLET | ORAL | Status: DC | PRN
  Administered 2012-08-06 – 2012-08-08 (×11): 10 mg via ORAL
  Administered 2012-08-08 (×3): 5 mg via ORAL
  Administered 2012-08-08 – 2012-08-09 (×6): 10 mg via ORAL
  Administered 2012-08-09 – 2012-08-10 (×3): 5 mg via ORAL
  Administered 2012-08-10: 10 mg via ORAL
  Filled 2012-08-06 (×7): qty 2
  Filled 2012-08-06: qty 1
  Filled 2012-08-06 (×3): qty 2
  Filled 2012-08-06: qty 1
  Filled 2012-08-06 (×6): qty 2
  Filled 2012-08-06 (×2): qty 1
  Filled 2012-08-06 (×2): qty 2
  Filled 2012-08-06: qty 1
  Filled 2012-08-06 (×2): qty 2

## 2012-08-06 MED ORDER — PROPOFOL 10 MG/ML IV BOLUS
INTRAVENOUS | Status: DC | PRN
  Administered 2012-08-06: 80 mg via INTRAVENOUS

## 2012-08-06 MED ORDER — DEXAMETHASONE 4 MG PO TABS
10.0000 mg | ORAL_TABLET | Freq: Once | ORAL | Status: AC
  Administered 2012-08-07: 10 mg via ORAL
  Filled 2012-08-06: qty 1

## 2012-08-06 MED ORDER — LACTATED RINGERS IV SOLN
INTRAVENOUS | Status: DC
  Administered 2012-08-06: 1000 mL via INTRAVENOUS

## 2012-08-06 MED ORDER — SODIUM CHLORIDE 0.9 % IJ SOLN
INTRAMUSCULAR | Status: DC | PRN
  Administered 2012-08-06: 10:00:00

## 2012-08-06 MED ORDER — ACETAMINOPHEN 10 MG/ML IV SOLN
1000.0000 mg | Freq: Four times a day (QID) | INTRAVENOUS | Status: AC
  Administered 2012-08-06 – 2012-08-07 (×4): 1000 mg via INTRAVENOUS
  Filled 2012-08-06 (×6): qty 100

## 2012-08-06 MED ORDER — METOCLOPRAMIDE HCL 5 MG/ML IJ SOLN
5.0000 mg | Freq: Three times a day (TID) | INTRAMUSCULAR | Status: DC | PRN
  Administered 2012-08-06: 10 mg via INTRAVENOUS
  Filled 2012-08-06: qty 2

## 2012-08-06 MED ORDER — 0.9 % SODIUM CHLORIDE (POUR BTL) OPTIME
TOPICAL | Status: DC | PRN
  Administered 2012-08-06: 1000 mL

## 2012-08-06 MED ORDER — BUPIVACAINE LIPOSOME 1.3 % IJ SUSP
20.0000 mL | Freq: Once | INTRAMUSCULAR | Status: DC
  Filled 2012-08-06: qty 20

## 2012-08-06 MED ORDER — HYDROCHLOROTHIAZIDE 12.5 MG PO CAPS
12.5000 mg | ORAL_CAPSULE | Freq: Every day | ORAL | Status: DC
  Administered 2012-08-07 – 2012-08-10 (×4): 12.5 mg via ORAL
  Filled 2012-08-06 (×5): qty 1

## 2012-08-06 MED ORDER — CEFAZOLIN SODIUM-DEXTROSE 2-3 GM-% IV SOLR
2.0000 g | INTRAVENOUS | Status: AC
  Administered 2012-08-06: 2 g via INTRAVENOUS

## 2012-08-06 MED ORDER — DEXAMETHASONE SODIUM PHOSPHATE 10 MG/ML IJ SOLN: 10.0000 mg | Freq: Once | INTRAMUSCULAR | Status: AC

## 2012-08-06 MED ORDER — CEFAZOLIN SODIUM 1-5 GM-% IV SOLN
1.0000 g | Freq: Four times a day (QID) | INTRAVENOUS | Status: AC
  Administered 2012-08-06 (×2): 1 g via INTRAVENOUS
  Filled 2012-08-06 (×2): qty 50

## 2012-08-06 SURGICAL SUPPLY — 52 items
BAG SPEC THK2 15X12 ZIP CLS (MISCELLANEOUS) ×1
BAG ZIPLOCK 12X15 (MISCELLANEOUS) ×2 IMPLANT
BIT DRILL 2.8X128 (BIT) ×2 IMPLANT
BLADE EXTENDED COATED 6.5IN (ELECTRODE) ×2 IMPLANT
BLADE SAW SAG 73X25 THK (BLADE) ×1
BLADE SAW SGTL 73X25 THK (BLADE) ×1 IMPLANT
CLOTH BEACON ORANGE TIMEOUT ST (SAFETY) ×2 IMPLANT
DECANTER SPIKE VIAL GLASS SM (MISCELLANEOUS) ×2 IMPLANT
DRAPE INCISE IOBAN 66X45 STRL (DRAPES) ×2 IMPLANT
DRAPE ORTHO SPLIT 77X108 STRL (DRAPES) ×4
DRAPE POUCH INSTRU U-SHP 10X18 (DRAPES) ×2 IMPLANT
DRAPE SURG ORHT 6 SPLT 77X108 (DRAPES) ×2 IMPLANT
DRAPE U-SHAPE 47X51 STRL (DRAPES) ×2 IMPLANT
DRSG ADAPTIC 3X8 NADH LF (GAUZE/BANDAGES/DRESSINGS) ×2 IMPLANT
DRSG EMULSION OIL 3X3 NADH (GAUZE/BANDAGES/DRESSINGS) ×2 IMPLANT
DRSG MEPILEX BORDER 4X4 (GAUZE/BANDAGES/DRESSINGS) ×2 IMPLANT
DRSG MEPILEX BORDER 4X8 (GAUZE/BANDAGES/DRESSINGS) ×2 IMPLANT
DRSG TEGADERM 8X12 (GAUZE/BANDAGES/DRESSINGS) ×1 IMPLANT
DURAPREP 26ML APPLICATOR (WOUND CARE) ×2 IMPLANT
ELECT REM PT RETURN 9FT ADLT (ELECTROSURGICAL) ×2
ELECTRODE REM PT RTRN 9FT ADLT (ELECTROSURGICAL) ×1 IMPLANT
EVACUATOR 1/8 PVC DRAIN (DRAIN) ×2 IMPLANT
FACESHIELD LNG OPTICON STERILE (SAFETY) ×8 IMPLANT
GLOVE BIO SURGEON STRL SZ7.5 (GLOVE) ×2 IMPLANT
GLOVE BIO SURGEON STRL SZ8 (GLOVE) ×2 IMPLANT
GLOVE BIOGEL PI IND STRL 8 (GLOVE) ×2 IMPLANT
GLOVE BIOGEL PI INDICATOR 8 (GLOVE) ×2
GLOVE SURG SS PI 6.5 STRL IVOR (GLOVE) ×4 IMPLANT
GOWN STRL NON-REIN LRG LVL3 (GOWN DISPOSABLE) ×4 IMPLANT
GOWN STRL REIN XL XLG (GOWN DISPOSABLE) ×3 IMPLANT
IMMOBILIZER KNEE 20 (SOFTGOODS) ×2
IMMOBILIZER KNEE 20 THIGH 36 (SOFTGOODS) IMPLANT
KIT BASIN OR (CUSTOM PROCEDURE TRAY) ×2 IMPLANT
MANIFOLD NEPTUNE II (INSTRUMENTS) ×2 IMPLANT
NDL SAFETY ECLIPSE 18X1.5 (NEEDLE) ×1 IMPLANT
NEEDLE HYPO 18GX1.5 SHARP (NEEDLE) ×2
NS IRRIG 1000ML POUR BTL (IV SOLUTION) ×2 IMPLANT
PACK TOTAL JOINT (CUSTOM PROCEDURE TRAY) ×2 IMPLANT
PASSER SUT SWANSON 36MM LOOP (INSTRUMENTS) ×2 IMPLANT
POSITIONER SURGICAL ARM (MISCELLANEOUS) ×2 IMPLANT
SPONGE GAUZE 4X4 12PLY (GAUZE/BANDAGES/DRESSINGS) ×2 IMPLANT
STRIP CLOSURE SKIN 1/2X4 (GAUZE/BANDAGES/DRESSINGS) ×4 IMPLANT
SUT ETHIBOND NAB CT1 #1 30IN (SUTURE) ×4 IMPLANT
SUT MNCRL AB 4-0 PS2 18 (SUTURE) ×2 IMPLANT
SUT VIC AB 2-0 CT1 27 (SUTURE) ×6
SUT VIC AB 2-0 CT1 TAPERPNT 27 (SUTURE) ×3 IMPLANT
SUT VLOC 180 0 24IN GS25 (SUTURE) ×4 IMPLANT
SYR 50ML LL SCALE MARK (SYRINGE) ×2 IMPLANT
TOWEL OR 17X26 10 PK STRL BLUE (TOWEL DISPOSABLE) ×4 IMPLANT
TOWEL OR NON WOVEN STRL DISP B (DISPOSABLE) ×2 IMPLANT
TRAY FOLEY CATH 14FRSI W/METER (CATHETERS) ×2 IMPLANT
WATER STERILE IRR 1500ML POUR (IV SOLUTION) ×2 IMPLANT

## 2012-08-06 NOTE — H&P (View-Only) (Signed)
Isaiah Graham  DOB: December 25, 1929 Married / Language: English / Race: White Male  Date of Admission:  08/06/2012  Chief Complaint:  Right Hip Pain  History of Present Illness The patient is a 79 year old male who comes in for a preoperative History and Physical. The patient is scheduled for a right total hip arthroplasty to be performed by Dr. Gus Rankin. Aluisio, MD at [facility] on 08/06/2012. The patient is a 79 year old male who presents with a hip problem. The patient was seen in referral from Dr. Caryl Never.The patient reports right anterior hip problems including pain symptoms that have been present for at least 5 months. The symptoms began without any known injury. Symptoms reported include difficulty flexing hip and difficulty rotating hip The patient describes the hip problem as sharp.The symptoms are described as mild.The patient feels as if their symptoms are does feel they are worsening. Symptoms are exacerbated by flexing hip and walking up stairs. He does have x-rays on the [facility] system from Dr. Mar Daring office. He had an inguinal hernia that he had operated on. He was unsure if the pain was coming from the hip or the hernia. The last 5 months the pain has been worse. He has pain at all times, worse with weightbearing. He reports that he is not having any trouble with the left hip. He is an active person but has found this to limit his activity level. Unfortunately, his hip is bothering him at all times. It is limiting what he can and cannot do. He can barely walk anymore. It's even hurting him at night. He recently saw Dr. Caryl Never and was told that the arthritis was severe. He is ready to proceed with surgery. They have been treated conservatively in the past for the above stated problem and despite conservative measures, they continue to have progressive pain and severe functional limitations and dysfunction. They have failed non-operative management  including home exercise, medications. It is felt that they would benefit from undergoing total joint replacement. Risks and benefits of the procedure have been discussed with the patient and they elect to proceed with surgery. There are no active contraindications to surgery such as ongoing infection or rapidly progressive neurological disease.  Problem List Osteoarthritis, Hip (715.35)  Allergies Morphine Derivatives. Sedation   Family History Cerebrovascular Accident. grandfather mothers side Diabetes Mellitus. sister Cancer. father and brother   Social History Drug/Alcohol Rehab (Previously). no Exercise. Exercises daily; does individual sport Illicit drug use. no Children. 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 no Tobacco use. former smoker Living situation. live with spouse Marital status. married Number of flights of stairs before winded. greater than 5 Alcohol use. never consumed alcohol Post-Surgical Plans. Plan is to go home. Advance Directives. Living Will, Heathcare POA   Medication History Norvasc (5MG  Tablet, Oral) Active. Aspirin EC (81MG  Tablet DR, Oral) Active. Colace (100MG  Capsule, Oral) Active. Synthroid ( Tablet, Oral) Active. Hyzaar (100-12.5MG  Tablet, Oral) Active. Multivitamin ( Oral) Active. Omega 3 (1000MG  Capsule, Oral) Active. PriLOSEC (20MG  Capsule DR, Oral) Active.   Past Surgical History Inguinal Hernia Repair. 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  Skin:Not Present- Hives, Itching, Rash, Eczema and Lesions. HEENT:Not Present- Tinnitus, Headache, Double Vision, Visual Loss, Hearing Loss and Dentures. Respiratory:Not Present- Shortness of breath with exertion, Shortness of breath at rest, Allergies, Coughing up blood and Chronic Cough. Cardiovascular:Not Present- Chest Pain, Racing/skipping heartbeats, Difficulty Breathing Lying Down, Murmur, Swelling and Palpitations. Gastrointestinal:Not Present- Bloody Stool, Heartburn, Abdominal Pain, Vomiting, Nausea, Constipation, Diarrhea, Difficulty Swallowing, Jaundice and Loss of appetitie. Male Genitourinary:Not Present- Urinary frequency, Blood in Urine, Weak urinary stream, Discharge, Flank Pain, Incontinence, Painful Urination, Urgency, Urinary Retention and Urinating at Night. Musculoskeletal:Not Present- Muscle Weakness, Muscle Pain, Joint Swelling, Joint Pain, Back Pain, Morning Stiffness and Spasms. Neurological:Not Present- Tremor, Dizziness, Blackout spells, Paralysis, Difficulty with balance and Weakness. Psychiatric:Not Present- Insomnia.   Vitals Weight: 125 lb Height: 67 in Weight was reported by patient. Height was reported by patient. Body Surface Area: 1.64 m Body Mass Index: 19.58 kg/m Pulse: 64 (Regular) Resp.: 12 (Unlabored) BP: 142/54 (Sitting, Right Arm, Standard)    Physical Exam The physical exam findings are as follows:   General Mental Status - Alert, cooperative and good historian. General Appearance- pleasant. Not in acute distress. Orientation- Oriented X3. Build & Nutrition- Lean, Well nourished and Well developed.   Head and Neck Head- normocephalic, atraumatic . Neck Global Assessment- supple. no bruit auscultated on the right and no bruit auscultated on the left. Upper dentures  Eye Pupil- Bilateral- Regular and Round. Motion- Bilateral- EOMI.   Chest and Lung Exam Auscultation: Breath  sounds:- clear at anterior chest wall and - clear at posterior chest wall. Adventitious sounds:- No Adventitious sounds.   Cardiovascular Auscultation:Rhythm- Regular rate and rhythm (with occassional pause or skipped beat on auscultation.). Heart Sounds- S1 WNL and S2 WNL. Murmurs & Other Heart Sounds:Auscultation of the heart reveals - No Murmurs.   Abdomen Palpation/Percussion:Tenderness- Abdomen is non-tender to palpation. Rigidity (guarding)- Abdomen is soft. Auscultation:Auscultation of the abdomen reveals - Bowel sounds normal.   Male Genitourinary  Not done, not pertinent to present illness  Musculoskeletal On exam, he is a very pleasant, well-developed male, alert and oriented, in no apparent distress. His left hip ROM is flexion 120, rotation in 30, out 40, abduction 40 without discomfort. Right hip flexion 90, no internal or external rotation, and essentially no abduction. He has pain on any attempt at ROM of the right hip.  RADIOGRAPHS: AP pelvis and lateral of the right hip show severe end stage arthritis of the right hip, bone on bone with large protrusio deformity and complete loss of acetabular cartilage.  Assessment & Plan Osteoarthritis, Hip (715.35) Impression: Right Hip  Note: Plan is for a Right Total Hip Replacement with Acetabular Grafting by Dr. Lequita Halt.  Plan is to go home.  PCP - Dr. Evelena Peat - Patient has been seen preoperatively and felt to be stable for surgery.  Signed electronically by Roberts Gaudy, PA-C

## 2012-08-06 NOTE — Transfer of Care (Signed)
Immediate Anesthesia Transfer of Care Note  Patient: Isaiah Graham  Procedure(s) Performed: Procedure(s) with comments: RIGHT TOTAL HIP ARTHOPLASTY WITH ACETABULAR AUTOGRAFT (Right) - RIGHT TOTAL HIP ARTHOPLASTY WITH ACETABULAR AUTOGRAFT   Patient Location: PACU  Anesthesia Type:General  Level of Consciousness: sedated  Airway & Oxygen Therapy: Patient Spontanous Breathing and Patient connected to face mask oxygen  Post-op Assessment: Report given to PACU RN and Post -op Vital signs reviewed and stable  Post vital signs: Reviewed and stable  Complications: No apparent anesthesia complications

## 2012-08-06 NOTE — Progress Notes (Signed)
PT NOTE:  PT deferred this date at request of pt and family - dtr states "we don't want to overwhelm him the first day".  Will follow in am.

## 2012-08-06 NOTE — Progress Notes (Signed)
Utilization review completed.  

## 2012-08-06 NOTE — Preoperative (Signed)
Beta Blockers   Reason not to administer Beta Blockers:Not Applicable 

## 2012-08-06 NOTE — Anesthesia Postprocedure Evaluation (Signed)
Anesthesia Post Note  Patient: Isaiah Graham  Procedure(s) Performed: Procedure(s) (LRB): RIGHT TOTAL HIP ARTHOPLASTY WITH ACETABULAR AUTOGRAFT (Right)  Anesthesia type: general  Patient location: PACU  Post pain: Pain level controlled  Post assessment: Patient's Cardiovascular Status Stable  Last Vitals:  Filed Vitals:   08/06/12 1145  BP: 143/49  Pulse: 52  Temp:   Resp: 16    Post vital signs: Reviewed and stable  Level of consciousness: sedated  Complications: No apparent anesthesia complications

## 2012-08-06 NOTE — Op Note (Signed)
Pre-operative diagnosis- Osteoarthritis Right hip with protrusio deformity  Post-operative diagnosis- Osteoarthritis  Right hip with protrusio deformity  Procedure-  RightTotal Hip Arthroplasty with acetabular autograft  Surgeon- Gus Rankin. Isaiah Salway, MD  Assistant- Isaiah Peace, Pa-C   Anesthesia  General  EBL- 200   Drain Hemovac   Complication- None  Condition-PACU - hemodynamically stable.   Brief Clinical Note- Isaiah Graham is a 79 y.o. male with end stage arthritis of his right hip with progressively worsening pain and dysfunction. Pain occurs with activity and rest including pain at night. He has tried analgesics, protected weight bearing and rest without benefit. Pain is too severe to attempt physical therapy. Radiographs demonstrate bone on bone arthritis with subchondral cyst formation. He presents now for right THA.  Procedure in detail-   The patient is brought into the operating room and placed on the operating table. After successful administration of General  anesthesia, the patient is placed in the  Left lateral decubitus position with the  Right side up and held in place with the hip positioner. The lower extremity is isolated from the perineum with plastic drapes and time-out is performed by the surgical team. The lower extremity is then prepped and draped in the usual sterile fashion. A short posterolateral incision is made with a ten blade through the subcutaneous tissue to the level of the fascia lata which is incised in line with the skin incision. The sciatic nerve is palpated and protected and the short external rotators and capsule are isolated from the femur. The hip is then dislocated and the center of the femoral head is marked. A trial prosthesis is placed such that the trial head corresponds to the center of the patients' native femoral head. The resection level is marked on the femoral neck and the resection is made with an oscillating saw. The femoral head  is removed and femoral retractors placed to gain access to the femoral canal.      The canal finder is passed into the femoral canal and the canal is thoroughly irrigated with sterile saline to remove the fatty contents. Axial reaming is performed to 13.5  mm, proximal reaming to 18 F  and the sleeve machined to a small. A 18 F small trial sleeve is placed into the proximal femur.      The femur is then retracted anteriorly to gain acetabular exposure. Acetabular retractors are placed and the labrum and osteophytes are removed, I reamed into the femoral head to obtain graft to fill in the protrusio deformity. Acetabular reaming is performed to 53 mm and the graft is then placed medially. A 54 mm Pinnacle acetabular shell is placed in anatomic position with excellent purchase. Additional dome screws were not needed. The permanent 36 mm neutral + 4 Marathon liner is placed into the acetabular shell.      The trial femur is then placed into the femoral canal. The size is 18 x 13  stem with a 36 + 8  neck and a 36 + 0 head with the neck version matching  the patients' native anteversion. The hip is reduced with excellent stability with full extension and full external rotation, 70 degrees flexion with 40 degrees adduction and 90 degrees internal rotation and 90 degrees of flexion with 70 degrees of internal rotation. The operative leg is placed on top of the non-operative leg and the leg lengths are found to be equal. The trials are then removed and the permanent implant of the same size  is impacted into the femoral canal. The metal femoral head of the same size as the trial is placed and the hip is reduced with the same stability parameters. The operative leg is again placed on top of the non-operative leg and the leg lengths are found to be equal.      The wound is then copiously irrigated with saline solution and the capsule and short external rotators are re-attached to the femur through drill holes with  Ethibond suture. The fascia lata is closed over a hemovac drain with #1 V-loc suture and the fascia lata, gluteal muscles and subcutaneous tissues are injected with Exparel 20ml diluted with saline 50ml. The subcutaneous tissues are closed with #1 and2-0 vicryl and the subcuticular layer closed with running 4-0 Monocryl. The drain is hooked to suction, incision cleaned and dried, and steri-srips and a bulky sterile dressing applied. The limb is placed into a knee immobilizer and the patient is awakened and transported to recovery in stable condition.      Please note that a surgical assistant was a medical necessity for this procedure in order to perform it in a safe and expeditious manner. The assistant was necessary to provide retraction to the vital neurovascular structures and to retract and position the limb to allow for anatomic placement of the prosthetic components.  [name], MD    08/06/2012, 10:12 AM

## 2012-08-06 NOTE — Anesthesia Preprocedure Evaluation (Addendum)
Anesthesia Evaluation  Patient identified by MRN, date of birth, ID band Patient awake    Reviewed: Allergy & Precautions, H&P , NPO status , Patient's Chart, lab work & pertinent test results, reviewed documented beta blocker date and time   Airway Mallampati: II TM Distance: >3 FB Neck ROM: full    Dental   Pulmonary neg pulmonary ROS,  breath sounds clear to auscultation        Cardiovascular hypertension, On Medications + Peripheral Vascular Disease negative cardio ROS  + Valvular Problems/Murmurs AI Rhythm:regular     Neuro/Psych negative neurological ROS  negative psych ROS   GI/Hepatic Neg liver ROS, GERD-  Medicated and Controlled,  Endo/Other  Hypothyroidism   Renal/GU Renal disease  negative genitourinary   Musculoskeletal   Abdominal   Peds  Hematology negative hematology ROS (+)   Anesthesia Other Findings See surgeon's H&P   Reproductive/Obstetrics negative OB ROS                           Anesthesia Physical Anesthesia Plan  ASA: III  Anesthesia Plan: General   Post-op Pain Management:    Induction: Intravenous  Airway Management Planned: Oral ETT  Additional Equipment:   Intra-op Plan:   Post-operative Plan: Extubation in OR  Informed Consent: I have reviewed the patients History and Physical, chart, labs and discussed the procedure including the risks, benefits and alternatives for the proposed anesthesia with the patient or authorized representative who has indicated his/her understanding and acceptance.   Dental Advisory Given  Plan Discussed with: CRNA and Surgeon  Anesthesia Plan Comments:         Anesthesia Quick Evaluation

## 2012-08-06 NOTE — Interval H&P Note (Signed)
History and Physical Interval Note:  08/06/2012 8:57 AM  Isaiah Graham  has presented today for surgery, with the diagnosis of RIGHT HIP PROTROSIO DEFORMITY   The various methods of treatment have been discussed with the patient and family. After consideration of risks, benefits and other options for treatment, the patient has consented to  Procedure(s) with comments: RIGHT TOTAL HIP ARTHOPLASTY WITH ACETABULAR AUTOGRAFT (Right) - RIGHT TOTAL HIP ARTHOPLASTY WITH ACETABULAR AUTOGRAFT  as a surgical intervention .  The patient's history has been reviewed, patient examined, no change in status, stable for surgery.  I have reviewed the patient's chart and labs.  Questions were answered to the patient's satisfaction.     Loanne Drilling

## 2012-08-07 LAB — CBC
Hemoglobin: 9.1 g/dL — ABNORMAL LOW (ref 13.0–17.0)
MCH: 30.8 pg (ref 26.0–34.0)
RBC: 2.95 MIL/uL — ABNORMAL LOW (ref 4.22–5.81)

## 2012-08-07 LAB — BASIC METABOLIC PANEL
CO2: 25 mEq/L (ref 19–32)
Glucose, Bld: 105 mg/dL — ABNORMAL HIGH (ref 70–99)
Potassium: 4.3 mEq/L (ref 3.5–5.1)
Sodium: 137 mEq/L (ref 135–145)

## 2012-08-07 NOTE — Evaluation (Signed)
Occupational Therapy Evaluation Patient Details Name: Isaiah Graham MRN: [id number] DOB: [birth year] Today's Date: 08/07/2012 Time: 1478-2956 OT Time Calculation (min): 31 min  OT Assessment / Plan / Recommendation Clinical Impression  This 79 y.o. male admitted for Rt. THA (posterior).  Pt. demonstrates the below listed deficits and will benefit from OT to allow him to return home at modified independent level with BADLs    OT Assessment  Patient needs continued OT Services    Follow Up Recommendations  No OT follow up;Supervision - Intermittent    Barriers to Discharge None    Equipment Recommendations  None recommended by OT    Recommendations for Other Services    Frequency  Min 2X/week    Precautions / Restrictions Precautions Precautions: Posterior Hip Precaution Comments: Pt able to state 3/3 precautions.  Requires min vc's during BADLs to adhere to them Restrictions Weight Bearing Restrictions: No Other Position/Activity Restrictions: WBAT       ADL  Eating/Feeding: Independent Where Assessed - Eating/Feeding: Chair Grooming: Wash/dry hands;Wash/dry face;Teeth care;Min guard Where Assessed - Grooming: Supported standing Upper Body Bathing: Supervision/safety Where Assessed - Upper Body Bathing: Supported sitting Lower Body Bathing: Minimal assistance Where Assessed - Lower Body Bathing: Supported sit to stand Upper Body Dressing: Supervision/safety Where Assessed - Upper Body Dressing: Unsupported sitting Lower Body Dressing: Min guard Where Assessed - Lower Body Dressing: Supported sit to Pharmacist, hospital: Hydrographic surveyor Method: Sit to Barista: Raised toilet seat with arms (or 3-in-1 over toilet) Toileting - Clothing Manipulation and Hygiene: Min guard Where Assessed - Engineer, mining and Hygiene: Standing Equipment Used: Rolling walker;Reacher;Long-handled sponge;Long-handled shoe horn;Sock  aid Transfers/Ambulation Related to ADLs: Ambulates with min guard assist ADL Comments: Pt and family instructed in use and acquisition of AE.  Pt requires min verbal cues to avoide bending too far during LB ADLs    OT Diagnosis: Generalized weakness;Acute pain  OT Problem List: Decreased strength;Decreased knowledge of use of DME or AE;Decreased knowledge of precautions;Pain OT Treatment Interventions: Self-care/ADL training;DME and/or AE instruction;Therapeutic activities;Patient/family education   OT Goals Acute Rehab OT Goals OT Goal Formulation: With patient Time For Goal Achievement: 08/14/12 Potential to Achieve Goals: Good ADL Goals Pt Will Perform Grooming: with modified independence;Standing at sink ADL Goal: Grooming - Progress: Goal set today Pt Will Perform Lower Body Bathing: with supervision;Sit to stand from chair;Sit to stand from bed;with adaptive equipment ADL Goal: Lower Body Bathing - Progress: Goal set today Pt Will Perform Lower Body Dressing: with modified independence;Sit to stand from chair;Sit to stand from bed;with adaptive equipment ADL Goal: Lower Body Dressing - Progress: Goal set today Pt Will Transfer to Toilet: with modified independence;Ambulation;3-in-1;Maintaining hip precautions ADL Goal: Toilet Transfer - Progress: Goal set today Pt Will Perform Toileting - Clothing Manipulation: with modified independence;Standing ADL Goal: Toileting - Clothing Manipulation - Progress: Goal set today Pt Will Perform Tub/Shower Transfer: with supervision;Ambulation;Tub transfer;Shower seat with back;Grab bars;Maintaining hip precautions ADL Goal: Tub/Shower Transfer - Progress: Goal set today  Visit Information  Last OT Received On: 08/07/12 Assistance Needed: +1    Subjective Data  Subjective: "That's not too bad" Patient Stated Goal: To golf   Prior Functioning     Home Living Lives With: Spouse;Son;Other (Comment) (has an aide 9am-3pm 5  day/wk) Available Help at Discharge: Personal care attendant Type of Home: House Home Access: Ramped entrance Home Layout: Two level;Able to live on main level with bedroom/bathroom Bathroom Shower/Tub: Walk-in shower (has a  rebath) Bathroom Toilet: Standard Home Adaptive Equipment: Built-in shower seat;Bedside commode/3-in-1;Walker - rolling;Wheelchair - Engineer, building services - four wheeled;Grab bars in shower;Grab bars around toilet Prior Function Level of Independence: Independent Able to Take Stairs?: Yes Driving: Yes Vocation: Retired Musician: No difficulties         Vision/Perception     Copywriter, advertising Overall Cognitive Status: Appears within functional limits for tasks assessed/performed Arousal/Alertness: Awake/alert Orientation Level: Appears intact for tasks assessed Behavior During Session: [name] for tasks performed    Extremity/Trunk Assessment Right Upper Extremity Assessment RUE ROM/Strength/Tone: Within functional levels RUE Coordination: WFL - gross/fine motor Left Upper Extremity Assessment LUE ROM/Strength/Tone: Within functional levels LUE Coordination: WFL - gross/fine motor Trunk Assessment Trunk Assessment: Normal     Mobility Transfers Transfers: Sit to Stand;Stand to Sit Sit to Stand: 4: Min guard;With upper extremity assist;From chair/3-in-1 Stand to Sit: 4: Min guard;With upper extremity assist;To chair/3-in-1 Details for Transfer Assistance: Verbal cues for hand placement      Exercise     Balance     End of Session OT - End of Session Activity Tolerance: Patient tolerated treatment well Patient left: in chair;with call bell/phone within reach;with family/visitor present  GO     [name], Ursula Alert M 08/07/2012, 3:13 PM

## 2012-08-07 NOTE — Progress Notes (Signed)
Physical Therapy Treatment Patient Details Name: Isaiah Graham MRN: [id number] DOB: [birth year] Today's Date: 08/07/2012 Time: 4098-1191 PT Time Calculation (min): 28 min  PT Assessment / Plan / Recommendation Comments on Treatment Session  Pt doing well with mobility, however continues to state that he is nauseated when up moving.  Discussed D/C plan with pt/family and pt now states that he would like to look into rehab because he is worried about caring for wife as well.  PT called CSW and left message.      Follow Up Recommendations  SNF     Does the patient have the potential to tolerate intense rehabilitation     Barriers to Discharge        Equipment Recommendations  None recommended by PT    Recommendations for Other Services    Frequency 7X/week   Plan Discharge plan needs to be updated    Precautions / Restrictions Precautions Precautions: Posterior Hip Precaution Comments: Continue to educate pt on THP Restrictions Weight Bearing Restrictions: No Other Position/Activity Restrictions: WBAT   Pertinent Vitals/Pain 4/10 pain    Mobility  Bed Mobility Bed Mobility: Sit to Supine Supine to Sit: 4: Min assist;HOB elevated;With rails Sit to Supine: 4: Min assist;HOB flat Details for Bed Mobility Assistance: Assist for RLE into bed with cues for hand placement and adjusting hips once in bed.  Transfers Transfers: Sit to Stand;Stand to Sit Sit to Stand: 4: Min guard;With upper extremity assist;From chair/3-in-1 Stand to Sit: 4: Min guard;With upper extremity assist;To bed Details for Transfer Assistance: Continue to provide cues for hand placement and LE management when sitting/standing.  Ambulation/Gait Ambulation/Gait Assistance: 4: Min guard Ambulation Distance (Feet): 45 Feet Assistive device: Rolling walker Ambulation/Gait Assistance Details: Min cues for upright posture and maintaining THP.  Gait Pattern: Step-to pattern;Decreased stride  length;Antalgic Gait velocity: decreased    Exercises Total Joint Exercises Ankle Circles/Pumps: AROM;Both;20 reps Quad Sets: AROM;Right;10 reps Heel Slides: AAROM;Right;10 reps Hip ABduction/ADduction: AAROM;Right;10 reps   PT Diagnosis:    PT Problem List:   PT Treatment Interventions:     PT Goals Acute Rehab PT Goals PT Goal Formulation: With patient Time For Goal Achievement: 08/11/12 Potential to Achieve Goals: Good Pt will go Supine/Side to Sit: with supervision PT Goal: Supine/Side to Sit - Progress: Progressing toward goal Pt will go Sit to Supine/Side: with supervision PT Goal: Sit to Supine/Side - Progress: Progressing toward goal Pt will go Sit to Stand: with supervision PT Goal: Sit to Stand - Progress: Progressing toward goal Pt will Ambulate: 51 - 150 feet;with supervision;with least restrictive assistive device PT Goal: Ambulate - Progress: Progressing toward goal Pt will Perform Home Exercise Program: with supervision, verbal cues required/provided PT Goal: Perform Home Exercise Program - Progress: Progressing toward goal  Visit Information  Last PT Received On: 08/07/12 Assistance Needed: +1    Subjective Data  Subjective: Everytime I get up, I feel nauseous.  Patient Stated Goal: to return home   Cognition  Cognition Overall Cognitive Status: Appears within functional limits for tasks assessed/performed Arousal/Alertness: Awake/alert Orientation Level: Appears intact for tasks assessed Behavior During Session: [facility] for tasks performed    Balance     End of Session PT - End of Session Activity Tolerance: Patient tolerated treatment well Patient left: in bed;with call bell/phone within reach;with family/visitor present Nurse Communication: Mobility status   GP     Vista Deck 08/07/2012, 5:04 PM

## 2012-08-07 NOTE — Evaluation (Signed)
Physical Therapy Evaluation Patient Details Name: Isaiah Graham MRN: [id number] DOB: 1929-10-19 Today's Date: 08/07/2012 Time: 4098-1191 PT Time Calculation (min): 31 min  PT Assessment / Plan / Recommendation Clinical Impression  Pt presents s/p R THA (post) POD 1 with decreased strength, ROM and mobility.  Tolerated OOB and ambulation in hallway well with RW at min assist.  Pt will benefit from skilled PT in acute venue to address deficits.  PT recommends HHPT for follow up at D/C to maximize pts independence.     PT Assessment  Patient needs continued PT services    Follow Up Recommendations  Home health PT    Does the patient have the potential to tolerate intense rehabilitation      Barriers to Discharge None      Equipment Recommendations  None recommended by PT    Recommendations for Other Services OT consult   Frequency 7X/week    Precautions / Restrictions Precautions Precautions: Posterior Hip Precaution Comments: Educated and provided hand out.  Restrictions Weight Bearing Restrictions: No Other Position/Activity Restrictions: WBAT   Pertinent Vitals/Pain Pt states no pain with ambulation.       Mobility  Bed Mobility Bed Mobility: Supine to Sit Supine to Sit: 4: Min assist;HOB elevated;With rails Details for Bed Mobility Assistance: Assist for RLE out of bed with mod cues for hand placement on bed to self assist.  Transfers Transfers: Sit to Stand;Stand to Sit Sit to Stand: 4: Min assist;From elevated surface;With upper extremity assist;From bed Stand to Sit: 4: Min assist;With upper extremity assist;With armrests;To chair/3-in-1 Details for Transfer Assistance: Assist to rise and steady with cues for hand placement, safety and LE management to maintain THP.  Ambulation/Gait Ambulation/Gait Assistance: 4: Min assist Ambulation Distance (Feet): 42 Feet Assistive device: Rolling walker Ambulation/Gait Assistance Details: Cues for  sequencing/technique with RW, maintaining upright posture and maintaining THP, esp when turning towards surgical side.  Gait Pattern: Step-to pattern;Decreased stride length;Antalgic Gait velocity: decreased Stairs: No Wheelchair Mobility Wheelchair Mobility: No    Exercises     PT Diagnosis: Difficulty walking;Generalized weakness;Acute pain  PT Problem List: Decreased strength;Decreased range of motion;Decreased activity tolerance;Decreased balance;Decreased mobility;Decreased coordination;Decreased knowledge of use of DME;Decreased knowledge of precautions;Pain PT Treatment Interventions: DME instruction;Gait training;Functional mobility training;Therapeutic activities;Therapeutic exercise;Balance training;Patient/family education   PT Goals Acute Rehab PT Goals PT Goal Formulation: With patient Time For Goal Achievement: 08/11/12 Potential to Achieve Goals: Good Pt will go Supine/Side to Sit: with supervision PT Goal: Supine/Side to Sit - Progress: Goal set today Pt will go Sit to Supine/Side: with supervision PT Goal: Sit to Supine/Side - Progress: Goal set today Pt will go Sit to Stand: with supervision PT Goal: Sit to Stand - Progress: Goal set today Pt will Ambulate: 51 - 150 feet;with supervision;with least restrictive assistive device PT Goal: Ambulate - Progress: Goal set today Pt will Perform Home Exercise Program: with supervision, verbal cues required/provided PT Goal: Perform Home Exercise Program - Progress: Goal set today  Visit Information  Last PT Received On: 08/07/12 Assistance Needed: +1    Subjective Data  Subjective: I'm going home when I leave the hospital.  Patient Stated Goal: to return home   Prior Functioning  Home Living Lives With: Spouse;Son;Other (Comment) (has an aide 9am-3pm 5 day/wk) Available Help at Discharge: Personal care attendant Type of Home: House Home Access: Ramped entrance Home Layout: Two level;Able to live on main level with  bedroom/bathroom Bathroom Shower/Tub:  (has a rebath) Firefighter: Standard Home Adaptive  Equipment: Built-in shower seat;Bedside commode/3-in-1;Walker - rolling;Wheelchair - Engineer, building services - four wheeled Prior Function Level of Independence: Independent Able to Take Stairs?: Yes Driving: Yes Vocation: Retired Musician: No difficulties    Copywriter, advertising Overall Cognitive Status: Appears within functional limits for tasks assessed/performed Arousal/Alertness: Awake/alert Orientation Level: Appears intact for tasks assessed Behavior During Session: [facility] for tasks performed    Extremity/Trunk Assessment Right Lower Extremity Assessment RLE ROM/Strength/Tone: Deficits RLE ROM/Strength/Tone Deficits: ankle motions WFL, able to perform hip abd with assist.  RLE Sensation: WFL - Light Touch Left Lower Extremity Assessment LLE ROM/Strength/Tone: WFL for tasks assessed LLE Sensation: WFL - Light Touch Trunk Assessment Trunk Assessment: Normal   Balance    End of Session PT - End of Session Equipment Utilized During Treatment: Gait belt Activity Tolerance: Patient tolerated treatment well Patient left: in chair;with call bell/phone within reach;with family/visitor present Nurse Communication: Mobility status  GP     Vista Deck 08/07/2012, 9:33 AM

## 2012-08-07 NOTE — Progress Notes (Signed)
   Subjective: 1 Day Post-Op Procedure(s) (LRB): RIGHT TOTAL HIP ARTHOPLASTY WITH ACETABULAR AUTOGRAFT (Right) Patient reports pain as 2 on 0-10 scale.   We will start therapy today.  Plan is to go Home after hospital stay.  Objective: Vital signs in last 24 hours: Temp:  [97.6 F (36.4 C)-98.7 F (37.1 C)] 98.2 F (36.8 C) (02/15 0600) Pulse Rate:  [46-75] 75 (02/15 0600) Resp:  [13-20] 18 (02/15 0738) BP: (129-166)/(45-81) 151/64 mmHg (02/15 0600) SpO2:  [94 %-100 %] 100 % (02/15 0738) Weight:  [130 lb (58.968 kg)] 130 lb (58.968 kg) (02/14 1215)  Intake/Output from previous day:  Intake/Output Summary (Last 24 hours) at 08/07/12 0851 Last data filed at 08/07/12 0810  Gross per 24 hour  Intake   2495 ml  Output   1620 ml  Net    875 ml    Intake/Output this shift: Total I/O In: 120 [P.O.:120] Out: 250 [Urine:250]  Labs:  Recent Labs  08/07/12 0441  HGB 9.1*    Recent Labs  08/07/12 0441  WBC 7.6  RBC 2.95*  HCT 26.4*  PLT 140*    Recent Labs  08/07/12 0441  NA 137  K 4.3  CL 106  CO2 25  BUN 27*  CREATININE 1.31  GLUCOSE 105*  CALCIUM 9.2   No results found for this basename: LABPT, INR,  in the last 72 hours  EXAM General - Patient is Alert, Appropriate and Oriented Extremity - Neurologically intact Neurovascular intact No cellulitis present Compartment soft Dressing - dressing C/D/I Motor Function - intact, moving foot and toes well on exam.  Hemovac pulled without difficulty.  Past Medical History  Diagnosis Date  . Hypertension   . Chronic kidney disease     kidney stones   . Esophageal cancer 1990  . Hypothyroidism   . Inguinal hernia February 2013    right  . Esophageal stricture   . Food impaction of esophagus   . Barrett esophagus   . Gastroparesis   . Aortic aneurysm, abdominal     repair  . Arthritis     Assessment/Plan: 1 Day Post-Op Procedure(s) (LRB): RIGHT TOTAL HIP ARTHOPLASTY WITH ACETABULAR AUTOGRAFT  (Right) Principal Problem:   OA (osteoarthritis) of hip   Advance diet Up with therapy D/C IV fluids Discharge home with home health on MOnday 2/17 as long as he is doing well and medically ready  DVT Prophylaxis - Xarelto Weight Bearing As Tolerated right Leg D/C Knee Immobilizer Hemovac Pulled Begin Therapy Hip Preacutions  Loanne Drilling

## 2012-08-07 NOTE — Progress Notes (Signed)
Physical Therapy Treatment Patient Details Name: Isaiah Graham MRN: [id number] DOB: Feb 08, 1930 Today's Date: 08/07/2012 Time: 1340-1400 PT Time Calculation (min): 20 min  PT Assessment / Plan / Recommendation Comments on Treatment Session  Pt progressing with mobility.  Pt wanted to try and have bowel movement in restroom, therefore PT stated to pt/family that we would be back to perform therex.     Follow Up Recommendations  Home health PT     Does the patient have the potential to tolerate intense rehabilitation     Barriers to Discharge        Equipment Recommendations  None recommended by PT    Recommendations for Other Services    Frequency 7X/week   Plan Discharge plan remains appropriate    Precautions / Restrictions Precautions Precautions: Posterior Hip Precaution Comments: Continue to educate pt on THP Restrictions Weight Bearing Restrictions: No Other Position/Activity Restrictions: WBAT   Pertinent Vitals/Pain 3/10 pain    Mobility  Bed Mobility Bed Mobility: Supine to Sit Supine to Sit: 4: Min assist;HOB elevated;With rails Details for Bed Mobility Assistance: Assist for RLE out of bed with mod cues for hand placement on bed to self assist.  Transfers Transfers: Sit to Stand;Stand to Sit Sit to Stand: 4: Min guard;With upper extremity assist;From bed Stand to Sit: 4: Min guard;With upper extremity assist;To chair/3-in-1 Details for Transfer Assistance: Cues for hand placement and maintaining THP.  Ambulation/Gait Ambulation/Gait Assistance: 4: Min guard Ambulation Distance (Feet): 83 Feet Assistive device: Rolling walker Ambulation/Gait Assistance Details: cues for sequencing/technique with RW, upright posture and maintaining THP, esp when turning towards surgical leg.  Gait Pattern: Step-to pattern;Decreased stride length;Antalgic Gait velocity: decreased    Exercises     PT Diagnosis:    PT Problem List:   PT Treatment Interventions:      PT Goals Acute Rehab PT Goals PT Goal Formulation: With patient Time For Goal Achievement: 08/11/12 Potential to Achieve Goals: Good Pt will go Supine/Side to Sit: with supervision PT Goal: Supine/Side to Sit - Progress: Progressing toward goal Pt will go Sit to Stand: with supervision PT Goal: Sit to Stand - Progress: Progressing toward goal Pt will Ambulate: 51 - 150 feet;with supervision;with least restrictive assistive device PT Goal: Ambulate - Progress: Progressing toward goal  Visit Information  Last PT Received On: 08/07/12 Assistance Needed: +1    Subjective Data  Subjective: I want to try and use the restroom first.  Patient Stated Goal: to return home   Cognition  Cognition Overall Cognitive Status: Appears within functional limits for tasks assessed/performed Arousal/Alertness: Awake/alert Orientation Level: Appears intact for tasks assessed Behavior During Session: [facility] for tasks performed    Balance     End of Session PT - End of Session Activity Tolerance: Patient tolerated treatment well Patient left: with family/visitor present (in restroom on 3in1) Nurse Communication: Mobility status (and that pt was on 3in1)   GP     Maxamus Colao, Meribeth Mattes 08/07/2012, 4:58 PM

## 2012-08-08 LAB — CBC
MCH: 31.2 pg (ref 26.0–34.0)
MCHC: 35.1 g/dL (ref 30.0–36.0)
MCV: 88.8 fL (ref 78.0–100.0)
Platelets: 120 10*3/uL — ABNORMAL LOW (ref 150–400)
RDW: 13.4 % (ref 11.5–15.5)

## 2012-08-08 LAB — BASIC METABOLIC PANEL
CO2: 23 mEq/L (ref 19–32)
Calcium: 9.2 mg/dL (ref 8.4–10.5)
Creatinine, Ser: 1.43 mg/dL — ABNORMAL HIGH (ref 0.50–1.35)
GFR calc Af Amer: 52 mL/min — ABNORMAL LOW (ref >90)
GFR calc non Af Amer: 45 mL/min — ABNORMAL LOW (ref >90)
Sodium: 131 mEq/L — ABNORMAL LOW (ref 135–145)

## 2012-08-08 MED ORDER — FERROUS SULFATE 325 (65 FE) MG PO TABS
325.0000 mg | ORAL_TABLET | Freq: Two times a day (BID) | ORAL | Status: DC
  Administered 2012-08-09 – 2012-08-10 (×3): 325 mg via ORAL
  Filled 2012-08-08 (×6): qty 1

## 2012-08-08 NOTE — Progress Notes (Signed)
Clinical Social Work Department BRIEF PSYCHOSOCIAL ASSESSMENT 08/08/2012  Patient:  Isaiah Graham, Isaiah Graham     Account Number:  [id number]     Admit date:  08/06/2012  Clinical Social Worker:  Leron Croak, CLINICAL SOCIAL WORKER  Date/Time:  08/08/2012 12:00 M  Referred by:  Physician  Date Referred:  08/06/2012 Referred for  SNF Placement   Other Referral:   Interview type:  Patient Other interview type:   Family was also at the bedside    PSYCHOSOCIAL DATA Living Status:  WIFE Admitted from facility:   Level of care:   Primary support name:  Patrich Heinze Primary support relationship to patient:  SPOUSE Degree of support available:   Pt has good support from family and home aids    CURRENT CONCERNS Current Concerns  Post-Acute Placement   Other Concerns:    SOCIAL WORK ASSESSMENT / PLAN CSW met with the pt at the bedside. Family was present at the time of the assessment. Family was aware of the referral for rehab and stated that they were concerned with Pt having a private room. Pt was saddened that he could not go home. Pt's wife recently became wheelchair bond and Pt would have limited people to assist him. Son and daughter stated they would also not be available for helping pt. Pt is agreeable to faxing information out in the local area.   Assessment/plan status:  Information/Referral to [facility] Other assessment/ plan:   Information/referral to community resources:   CSW provided  SNF information for Pt and family.    PATIENT'S/FAMILY'S RESPONSE TO PLAN OF CARE: Pt and family were appreciative for assistance with d/c planning        Haydn Hutsell Marvis [employer] Weekend Coverage [phone]

## 2012-08-08 NOTE — Progress Notes (Signed)
14 french coude catheter inserted without difficulty and no resistance felt. Immediate returns of clear yellow urine. Inserted by myself, urology RN Ginny Forth

## 2012-08-08 NOTE — Progress Notes (Signed)
Physical Therapy Treatment Patient Details Name: Isaiah Graham MRN: [id number] DOB: 1930-03-03 Today's Date: 08/08/2012 Time: 1191-4782 PT Time Calculation (min): 14 min  PT Assessment / Plan / Recommendation Comments on Treatment Session  Pt continues to progress with mobility, however continues to have concern about returning home and having enough help at home.     Follow Up Recommendations  SNF     Does the patient have the potential to tolerate intense rehabilitation     Barriers to Discharge        Equipment Recommendations  None recommended by PT    Recommendations for Other Services    Frequency 7X/week   Plan Discharge plan needs to be updated    Precautions / Restrictions Precautions Precautions: Posterior Hip Precaution Comments: pt able to recall 3/3 THP Restrictions Weight Bearing Restrictions: No Other Position/Activity Restrictions: WBAT   Pertinent Vitals/Pain 3/10 pain    Mobility  Bed Mobility Bed Mobility: Supine to Sit Supine to Sit: 4: Min guard;HOB elevated Details for Bed Mobility Assistance: Guarding assist for RLE out of bed with min cues for technique.  Transfers Transfers: Sit to Stand;Stand to Sit Sit to Stand: 4: Min guard;From elevated surface;From bed Stand to Sit: 4: Min guard;With upper extremity assist;With armrests;To chair/3-in-1 Details for Transfer Assistance: Min/guard for safety with min cues for hand placement.  Ambulation/Gait Ambulation/Gait Assistance: 4: Min guard;5: Supervision Ambulation Distance (Feet): 100 Feet Assistive device: Rolling walker Ambulation/Gait Assistance Details: Min cues for upright posture and correct sequencing/technique with RW.  Gait Pattern: Step-to pattern;Decreased stride length;Antalgic Gait velocity: decreased    Exercises     PT Diagnosis:    PT Problem List:   PT Treatment Interventions:     PT Goals Acute Rehab PT Goals PT Goal Formulation: With patient Time For Goal  Achievement: 08/11/12 Potential to Achieve Goals: Good Pt will go Supine/Side to Sit: with supervision PT Goal: Supine/Side to Sit - Progress: Progressing toward goal Pt will go Sit to Stand: with supervision PT Goal: Sit to Stand - Progress: Progressing toward goal Pt will Ambulate: 51 - 150 feet;with supervision;with least restrictive assistive device PT Goal: Ambulate - Progress: Progressing toward goal  Visit Information  Last PT Received On: 08/08/12 Assistance Needed: +1    Subjective Data  Subjective: I think I'm better off going to rehab, they can't take care of me at home.  Patient Stated Goal: to return home   Cognition  Cognition Overall Cognitive Status: Appears within functional limits for tasks assessed/performed Arousal/Alertness: Awake/alert Orientation Level: Appears intact for tasks assessed Behavior During Session: [facility] for tasks performed    Balance     End of Session PT - End of Session Activity Tolerance: Patient tolerated treatment well Patient left: in chair;with call bell/phone within reach;with family/visitor present (PA in room to change dressing) Nurse Communication: Mobility status   GP     Vista Deck 08/08/2012, 9:38 AM

## 2012-08-08 NOTE — Progress Notes (Signed)
Pt voiding only 125-150 mls q40m. Did a bladder scan and found PVR of >917mls. Attempted an in and out catheter, but met resistance. Dr. Victorino Dike gave a verbal order over the phone to put in a coude catheter. Called 4th floor charge nurse, Inetta Fermo, to have coude inserted. Will continue to monitor.

## 2012-08-08 NOTE — Progress Notes (Signed)
Physical Therapy Treatment Patient Details Name: Isaiah Graham MRN: [id number] DOB: 1929-11-30 Today's Date: 08/08/2012 Time: 4098-1191 PT Time Calculation (min): 24 min  PT Assessment / Plan / Recommendation Comments on Treatment Session  This session limited by pain.  Attempted to place pillows with pt in supine, however pt still c/o pain. Assisted to chair and placed pillow under buttocks, lumbar roll at back and pillows under knees.  Pt states that this is somewhat better, however still with pain.  RN aware.     Follow Up Recommendations  SNF     Does the patient have the potential to tolerate intense rehabilitation     Barriers to Discharge        Equipment Recommendations  None recommended by PT    Recommendations for Other Services    Frequency 7X/week   Plan Discharge plan remains appropriate    Precautions / Restrictions Precautions Precautions: Posterior Hip Restrictions Weight Bearing Restrictions: No Other Position/Activity Restrictions: WBAT   Pertinent Vitals/Pain 8/10 pain in knee alternating to buttock.  Unable to get comfortable.  RN aware.     Mobility  Bed Mobility Bed Mobility: Supine to Sit Supine to Sit: 4: Min guard Details for Bed Mobility Assistance: Guarding assist for RLE out of bed with min cues for technique.  Transfers Transfers: Sit to Stand;Stand to Sit;Stand Pivot Transfers Sit to Stand: 4: Min guard;From elevated surface;From bed Stand to Sit: 4: Min guard;With upper extremity assist;With armrests;To chair/3-in-1 Stand Pivot Transfers: 4: Min guard Details for Transfer Assistance: Min/guard for safety.  Pt demos good UE technique. Assisted pt to chair to attempt to make pt more comfortable.     Exercises     PT Diagnosis:    PT Problem List:   PT Treatment Interventions:     PT Goals Acute Rehab PT Goals PT Goal Formulation: With patient Time For Goal Achievement: 08/11/12 Potential to Achieve Goals: Good Pt will go  Supine/Side to Sit: with supervision PT Goal: Supine/Side to Sit - Progress: Progressing toward goal Pt will go Sit to Stand: with supervision PT Goal: Sit to Stand - Progress: Progressing toward goal  Visit Information  Last PT Received On: 08/08/12 Assistance Needed: +1    Subjective Data  Subjective: I just can't get comfortable.  Patient Stated Goal: to return home   Cognition  Cognition Overall Cognitive Status: Appears within functional limits for tasks assessed/performed Arousal/Alertness: Awake/alert Orientation Level: Appears intact for tasks assessed Behavior During Session: [facility] for tasks performed    Balance     End of Session PT - End of Session Activity Tolerance: Patient limited by pain Patient left: in chair;with call bell/phone within reach;with family/visitor present Nurse Communication: Mobility status;Patient requests pain meds   GP     Vista Deck 08/08/2012, 4:10 PM

## 2012-08-08 NOTE — Progress Notes (Signed)
Clinical Social Work Department CLINICAL SOCIAL WORK PLACEMENT NOTE 08/08/2012  Patient:  Isaiah Graham, Isaiah Graham  Account Number:  [id number] Admit date:  08/06/2012  Clinical Social Worker:  Leron Croak, CLINICAL SOCIAL WORKER  Date/time:  08/08/2012 12:00 M  Clinical Social Work is seeking post-discharge placement for this patient at the following level of care:   SKILLED NURSING   (*CSW will update this form in Epic as items are completed)   08/08/2012  Patient/family provided with [facility] Department of Clinical Social Work's list of facilities offering this level of care within the geographic area requested by the patient (or if unable, by the patient's family).  08/08/2012  Patient/family informed of their freedom to choose among providers that offer the needed level of care, that participate in Medicare, Medicaid or managed care program needed by the patient, have an available bed and are willing to accept the patient.  08/08/2012  Patient/family informed of [facility]' ownership interest in [facility], as well as of the fact that they are under no obligation to receive care at this facility.  PASARR submitted to EDS on 08/08/2012 PASARR number received from [employer] on 08/08/2012  FL2 transmitted to all facilities in geographic area requested by pt/family on  08/08/2012 FL2 transmitted to all facilities within larger geographic area on 08/08/2012  Patient informed that his/her managed care company has contracts with or will negotiate with  certain facilities, including the following:     Patient/family informed of bed offers received:   Patient chooses bed at  Physician recommends and patient chooses bed at    Patient to be transferred to  on   Patient to be transferred to facility by   The following physician request were entered in Epic:   Additional Comments:   Leron Croak, Leeroy Bock [employer] Weekend Coverage [phone]

## 2012-08-08 NOTE — Progress Notes (Signed)
Subjective: Patient has no specific complaints this morning has been off with physical therapy   Objective: Vital signs in last 24 hours: Temp:  [97.8 F (36.6 C)-99.1 F (37.3 C)] 98.9 F (37.2 C) (02/16 0430) Pulse Rate:  [61-69] 68 (02/16 0430) Resp:  [16-20] 20 (02/16 0430) BP: (124-163)/(51-65) 133/58 mmHg (02/16 0430) SpO2:  [96 %-100 %] 96 % (02/16 0430)  Intake/Output from previous day: 02/15 0701 - 02/16 0700 In: 420 [P.O.:420] Out: 2025 [Urine:2025] Intake/Output this shift: Total I/O In: -  Out: 600 [Urine:600]   Recent Labs  08/07/12 0441 08/08/12 0435  HGB 9.1* 8.1*    Recent Labs  08/07/12 0441 08/08/12 0435  WBC 7.6 9.7  RBC 2.95* 2.60*  HCT 26.4* 23.1*  PLT 140* 120*    Recent Labs  08/07/12 0441 08/08/12 0435  NA 137 131*  K 4.3 4.3  CL 106 102  CO2 25 23  BUN 27* 25*  CREATININE 1.31 1.43*  GLUCOSE 105* 115*  CALCIUM 9.2 9.2   No results found for this basename: LABPT, INR,  in the last 72 hours  Patient's conscious alert appropriate appears to be in no distress after to sit down from physical therapy his right hip dressing was removed he had a little bit of still of bloody drainage from the the drain portal site no signs of infection was well approximated with Steri-Strips his leg was neuromotor vascularly intact.  Assessment/Plan: Postop day number or 2 status post right total hip arthroplasty stable. Acute blood postoperative blood loss anemia asymptomatic tolerating it well with activities and vital signs are stable we'll monitor Postoperative urinary retention Foley cath placed we'll keep and 24 hours and DC tomorrow  Plan continued out of bed with physical therapy we'll keep Foley catheter in place today. Will allow Dr. Lequita Halt to evaluate tomorrow for probable skilled nursing facility placement and removal Foley cath for urinary retention   Jamelle Rushing 08/08/2012, 8:45 AM

## 2012-08-09 DIAGNOSIS — Z9289 Personal history of other medical treatment: Secondary | ICD-10-CM

## 2012-08-09 DIAGNOSIS — E871 Hypo-osmolality and hyponatremia: Secondary | ICD-10-CM

## 2012-08-09 DIAGNOSIS — D62 Acute posthemorrhagic anemia: Secondary | ICD-10-CM

## 2012-08-09 LAB — CBC
MCV: 89.1 fL (ref 78.0–100.0)
Platelets: 125 10*3/uL — ABNORMAL LOW (ref 150–400)
RBC: 2.39 MIL/uL — ABNORMAL LOW (ref 4.22–5.81)
RDW: 13.4 % (ref 11.5–15.5)
WBC: 8.4 10*3/uL (ref 4.0–10.5)

## 2012-08-09 LAB — PREPARE RBC (CROSSMATCH)

## 2012-08-09 MED ORDER — FUROSEMIDE 10 MG/ML IJ SOLN
10.0000 mg | Freq: Once | INTRAMUSCULAR | Status: AC
  Administered 2012-08-09: 10 mg via INTRAVENOUS
  Filled 2012-08-09: qty 1

## 2012-08-09 MED ORDER — ACETAMINOPHEN 325 MG PO TABS
650.0000 mg | ORAL_TABLET | Freq: Once | ORAL | Status: AC
  Administered 2012-08-09: 650 mg via ORAL
  Filled 2012-08-09: qty 2

## 2012-08-09 NOTE — Progress Notes (Signed)
Physical Therapy Treatment Patient Details Name: Isaiah Graham MRN: [id number] DOB: 09-01-1929 Today's Date: 08/09/2012 Time: 0865-7846 PT Time Calculation (min): 16 min  PT Assessment / Plan / Recommendation Comments on Treatment Session  Pt continues to have increased pain in knee and could not get comfortable.  Noted pt getting PRBC and will attempt to ambulate with pt following this.  This session focused on therex.     Follow Up Recommendations  SNF     Does the patient have the potential to tolerate intense rehabilitation     Barriers to Discharge        Equipment Recommendations  None recommended by PT    Recommendations for Other Services    Frequency 7X/week   Plan Discharge plan remains appropriate    Precautions / Restrictions Precautions Precautions: Posterior Hip Precaution Comments: pt able to recall 3/3 THP Restrictions Weight Bearing Restrictions: No Other Position/Activity Restrictions: WBAT   Pertinent Vitals/Pain 6/10 pain in R hip to knee    Mobility  Bed Mobility Bed Mobility: Not assessed Transfers Transfers: Not assessed Ambulation/Gait Ambulation/Gait Assistance: Not tested (comment)    Exercises Total Joint Exercises Ankle Circles/Pumps: AROM;Both;20 reps Quad Sets: AROM;Right;10 reps Short Arc QuadBarbaraann Boys;Right;10 reps Heel Slides: AAROM;Right;10 reps Hip ABduction/ADduction: AAROM;Right;10 reps   PT Diagnosis:    PT Problem List:   PT Treatment Interventions:     PT Goals Acute Rehab PT Goals PT Goal Formulation: With patient Time For Goal Achievement: 08/11/12 Potential to Achieve Goals: Good Pt will Perform Home Exercise Program: with supervision, verbal cues required/provided PT Goal: Perform Home Exercise Program - Progress: Progressing toward goal  Visit Information  Last PT Received On: 08/09/12 Assistance Needed: +1    Subjective Data  Subjective: I still can't get comfortable.  Patient Stated Goal: to return  home   Cognition  Cognition Overall Cognitive Status: Appears within functional limits for tasks assessed/performed Arousal/Alertness: Awake/alert Orientation Level: Appears intact for tasks assessed Behavior During Session: [facility] for tasks performed    Balance     End of Session PT - End of Session Activity Tolerance: Patient limited by pain Patient left: in chair;with call bell/phone within reach;with nursing in room Nurse Communication: Mobility status   GP     Vista Deck 08/09/2012, 12:31 PM

## 2012-08-09 NOTE — Care Management Note (Signed)
    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   (If response is "NO", the following Medicare IM given date fields will be blank) Date Medicare IM given:   Date Additional Medicare IM given:    Discharge Disposition:    Per UR Regulation:  Reviewed for med. necessity/level of care/duration of stay  If discussed at Long Length of Stay Meetings, dates discussed:    Comments:

## 2012-08-09 NOTE — Progress Notes (Signed)
   Subjective: 3 Days Post-Op Procedure(s) (LRB): RIGHT TOTAL HIP ARTHOPLASTY WITH ACETABULAR AUTOGRAFT (Right) Patient reports pain as mild.   Patient seen in rounds with Dr. Lequita Halt. Patient is well, but has had some minor complaints of dizziness with ambulation.  Will give blood. Plan is to go Skilled nursing facility after hospital stay. Probably tomorrow.  Objective: Vital signs in last 24 hours: Temp:  [98.1 F (36.7 C)-100.1 F (37.8 C)] 98.1 F (36.7 C) (02/17 0514) Pulse Rate:  [60-66] 60 (02/17 0514) Resp:  [16-20] 20 (02/17 0514) BP: (105-142)/(39-60) 117/59 mmHg (02/17 0514) SpO2:  [95 %-98 %] 97 % (02/17 0514)  Intake/Output from previous day:  Intake/Output Summary (Last 24 hours) at 08/09/12 0654 Last data filed at 08/09/12 0514  Gross per 24 hour  Intake    480 ml  Output   1175 ml  Net   -695 ml    Intake/Output this shift: Total I/O In: 0  Out: 375 [Urine:375]  Labs:  Recent Labs  08/07/12 0441 08/08/12 0435 08/09/12 0419  HGB 9.1* 8.1* 7.6*    Recent Labs  08/08/12 0435 08/09/12 0419  WBC 9.7 8.4  RBC 2.60* 2.39*  HCT 23.1* 21.3*  PLT 120* 125*    Recent Labs  08/07/12 0441 08/08/12 0435  NA 137 131*  K 4.3 4.3  CL 106 102  CO2 25 23  BUN 27* 25*  CREATININE 1.31 1.43*  GLUCOSE 105* 115*  CALCIUM 9.2 9.2   No results found for this basename: LABPT, INR,  in the last 72 hours  EXAM General - Patient is Alert, Appropriate and Oriented Extremity - Neurovascular intact Sensation intact distally Dorsiflexion/Plantar flexion intact No cellulitis present Dressing/Incision - clean, dry, no drainage, healing Motor Function - intact, moving foot and toes well on exam.   Past Medical History  Diagnosis Date  . Hypertension   . Chronic kidney disease     kidney stones   . Esophageal cancer 1990  . Hypothyroidism   . Inguinal hernia February 2013    right  . Esophageal stricture   . Food impaction of esophagus   . Barrett  esophagus   . Gastroparesis   . Aortic aneurysm, abdominal     repair  . Arthritis     Assessment/Plan: 3 Days Post-Op Procedure(s) (LRB): RIGHT TOTAL HIP ARTHOPLASTY WITH ACETABULAR AUTOGRAFT (Right) Principal Problem:   OA (osteoarthritis) of hip Active Problems:   Postoperative anemia due to acute blood loss   Postop Hyponatremia   Postop Transfusion  Estimated body mass index is 20.36 kg/(m^2) as calculated from the following:   Height as of this encounter: 5\' 7"  (1.702 m).   Weight as of this encounter: 58.968 kg (130 lb). Up with therapy Continue foley due to blood transfusion; will continue until blood transfusion complete  DVT Prophylaxis - Xarelto Weight Bearing As Tolerated right Leg Blood today. Transfer tomorrow.  Tadd Holtmeyer 08/09/2012, 6:54 AM

## 2012-08-10 ENCOUNTER — Encounter (HOSPITAL_COMMUNITY): Payer: Self-pay | Admitting: Orthopedic Surgery

## 2012-08-10 DIAGNOSIS — L89151 Pressure ulcer of sacral region, stage 1: Secondary | ICD-10-CM

## 2012-08-10 LAB — BASIC METABOLIC PANEL
BUN: 27 mg/dL — ABNORMAL HIGH (ref 6–23)
Creatinine, Ser: 1.33 mg/dL (ref 0.50–1.35)
GFR calc Af Amer: 57 mL/min — ABNORMAL LOW (ref >90)
GFR calc non Af Amer: 49 mL/min — ABNORMAL LOW (ref >90)
Potassium: 3.9 mEq/L (ref 3.5–5.1)

## 2012-08-10 LAB — TYPE AND SCREEN
Antibody Screen: NEGATIVE
Unit division: 0

## 2012-08-10 LAB — CBC
Hemoglobin: 9.8 g/dL — ABNORMAL LOW (ref 13.0–17.0)
MCHC: 35.4 g/dL (ref 30.0–36.0)
RDW: 14.8 % (ref 11.5–15.5)
WBC: 8.2 10*3/uL (ref 4.0–10.5)

## 2012-08-10 MED ORDER — RIVAROXABAN 10 MG PO TABS: 10.0000 mg | ORAL_TABLET | Freq: Every day | ORAL | Status: DC

## 2012-08-10 MED ORDER — POLYETHYLENE GLYCOL 3350 17 G PO PACK: 17.0000 g | PACK | Freq: Every day | ORAL | Status: DC | PRN

## 2012-08-10 MED ORDER — ONDANSETRON HCL 4 MG PO TABS: 4.0000 mg | ORAL_TABLET | Freq: Four times a day (QID) | ORAL | Status: DC | PRN

## 2012-08-10 MED ORDER — ACETAMINOPHEN 325 MG PO TABS: 650.0000 mg | ORAL_TABLET | Freq: Four times a day (QID) | ORAL | Status: DC | PRN

## 2012-08-10 MED ORDER — METHOCARBAMOL 500 MG PO TABS: 500.0000 mg | ORAL_TABLET | Freq: Four times a day (QID) | ORAL | Status: DC | PRN

## 2012-08-10 MED ORDER — MAGNESIUM HYDROXIDE 400 MG/5ML PO SUSP: 30.0000 mL | Freq: Four times a day (QID) | ORAL | Status: DC | PRN

## 2012-08-10 MED ORDER — OXYCODONE HCL 5 MG PO TABS: 5.0000 mg | ORAL_TABLET | ORAL | Status: DC | PRN

## 2012-08-10 MED ORDER — BISACODYL 10 MG RE SUPP: 10.0000 mg | Freq: Every day | RECTAL | Status: DC | PRN

## 2012-08-10 MED ORDER — TRAMADOL HCL 50 MG PO TABS: 50.0000 mg | ORAL_TABLET | Freq: Four times a day (QID) | ORAL | Status: DC | PRN

## 2012-08-10 MED ORDER — MAGNESIUM HYDROXIDE 400 MG/5ML PO SUSP
30.0000 mL | Freq: Four times a day (QID) | ORAL | Status: DC | PRN
  Administered 2012-08-10: 30 mL via ORAL
  Filled 2012-08-10: qty 30

## 2012-08-10 MED ORDER — FERROUS SULFATE 325 (65 FE) MG PO TABS: 325.0000 mg | ORAL_TABLET | Freq: Two times a day (BID) | ORAL | Status: DC

## 2012-08-10 NOTE — Discharge Summary (Signed)
Physician Discharge Summary   Patient ID: Isaiah Graham MRN: [id number] DOB/AGE: 77-May-1931 77 y.o.  Admit date: 08/06/2012 Discharge date: 08/10/2012  Primary Diagnosis:  Osteoarthritis Right hip with protrusio deformity  Admission Diagnoses:  Past Medical History  Diagnosis Date  . Hypertension   . Chronic kidney disease     kidney stones   . Esophageal cancer 1990  . Hypothyroidism   . Inguinal hernia February 2013    right  . Esophageal stricture   . Food impaction of esophagus   . Barrett esophagus   . Gastroparesis   . Aortic aneurysm, abdominal     repair  . Arthritis    Discharge Diagnoses:   Principal Problem:   OA (osteoarthritis) of hip Active Problems:   Postoperative anemia due to acute blood loss   Postop Hyponatremia   Postop Transfusion   Decubitus ulcer of sacral region, stage 1  Estimated body mass index is 20.36 kg/(m^2) as calculated from the following:   Height as of this encounter: 5\' 7"  (1.702 m).   Weight as of this encounter: 58.968 kg (130 lb).  Classification of overweight in adults according to BMI (WHO, 1998)   Procedure: Procedure(s) (LRB): RIGHT TOTAL HIP ARTHOPLASTY WITH ACETABULAR AUTOGRAFT (Right)   Consults: None  HPI: Isaiah Graham is a 79 y.o. male with end stage arthritis of his right hip with progressively worsening pain and dysfunction. Pain occurs with activity and rest including pain at night. He has tried analgesics, protected weight bearing and rest without benefit. Pain is too severe to attempt physical therapy. Radiographs demonstrate bone on bone arthritis with subchondral cyst formation. He presents now for right THA.  Laboratory Data: Admission on 08/06/2012  Component Date Value Range Status  . WBC 08/07/2012 7.6  4.0 - 10.5 K/uL Final  . RBC 08/07/2012 2.95* 4.22 - 5.81 MIL/uL Final  . Hemoglobin 08/07/2012 9.1* 13.0 - 17.0 g/dL Final  . HCT 06/25/7251 26.4* 39.0 - 52.0 % Final  . MCV 08/07/2012  89.5  78.0 - 100.0 fL Final  . MCH 08/07/2012 30.8  26.0 - 34.0 pg Final  . MCHC 08/07/2012 34.5  30.0 - 36.0 g/dL Final  . RDW 66/44/0347 13.3  11.5 - 15.5 % Final  . Platelets 08/07/2012 140* 150 - 400 K/uL Final  . Sodium 08/07/2012 137  135 - 145 mEq/L Final  . Potassium 08/07/2012 4.3  3.5 - 5.1 mEq/L Final  . Chloride 08/07/2012 106  96 - 112 mEq/L Final  . CO2 08/07/2012 25  19 - 32 mEq/L Final  . Glucose, Bld 08/07/2012 105* 70 - 99 mg/dL Final  . BUN 42/59/5638 27* 6 - 23 mg/dL Final  . Creatinine, Ser 08/07/2012 1.31  0.50 - 1.35 mg/dL Final  . Calcium 75/64/3329 9.2  8.4 - 10.5 mg/dL Final  . GFR calc non Af Amer 08/07/2012 50* >90 mL/min Final  . GFR calc Af Amer 08/07/2012 58* >90 mL/min Final   Comment:                                 The eGFR has been calculated                          using the CKD EPI equation.  This calculation has not been                          validated in all clinical                          situations.                          eGFR's persistently                          <90 mL/min signify                          possible Chronic Kidney Disease.  . WBC 08/08/2012 9.7  4.0 - 10.5 K/uL Final  . RBC 08/08/2012 2.60* 4.22 - 5.81 MIL/uL Final  . Hemoglobin 08/08/2012 8.1* 13.0 - 17.0 g/dL Final  . HCT 16/03/9603 23.1* 39.0 - 52.0 % Final  . MCV 08/08/2012 88.8  78.0 - 100.0 fL Final  . MCH 08/08/2012 31.2  26.0 - 34.0 pg Final  . MCHC 08/08/2012 35.1  30.0 - 36.0 g/dL Final  . RDW 54/02/8118 13.4  11.5 - 15.5 % Final  . Platelets 08/08/2012 120* 150 - 400 K/uL Final  . Sodium 08/08/2012 131* 135 - 145 mEq/L Final  . Potassium 08/08/2012 4.3  3.5 - 5.1 mEq/L Final  . Chloride 08/08/2012 102  96 - 112 mEq/L Final  . CO2 08/08/2012 23  19 - 32 mEq/L Final  . Glucose, Bld 08/08/2012 115* 70 - 99 mg/dL Final  . BUN 14/78/2956 25* 6 - 23 mg/dL Final  . Creatinine, Ser 08/08/2012 1.43* 0.50 - 1.35 mg/dL Final  . Calcium  21/30/8657 9.2  8.4 - 10.5 mg/dL Final  . GFR calc non Af Amer 08/08/2012 45* >90 mL/min Final  . GFR calc Af Amer 08/08/2012 52* >90 mL/min Final   Comment:                                 The eGFR has been calculated                          using the CKD EPI equation.                          This calculation has not been                          validated in all clinical                          situations.                          eGFR's persistently                          <90 mL/min signify                          possible Chronic Kidney Disease.  . WBC 08/09/2012 8.4  4.0 - 10.5 K/uL Final  .  RBC 08/09/2012 2.39* 4.22 - 5.81 MIL/uL Final  . Hemoglobin 08/09/2012 7.6* 13.0 - 17.0 g/dL Final  . HCT 11/91/4782 21.3* 39.0 - 52.0 % Final  . MCV 08/09/2012 89.1  78.0 - 100.0 fL Final  . MCH 08/09/2012 31.8  26.0 - 34.0 pg Final  . MCHC 08/09/2012 35.7  30.0 - 36.0 g/dL Final  . RDW 95/62/1308 13.4  11.5 - 15.5 % Final  . Platelets 08/09/2012 125* 150 - 400 K/uL Final  . Order Confirmation 08/09/2012 ORDER PROCESSED BY BLOOD BANK   Final  . Sodium 08/10/2012 137  135 - 145 mEq/L Final  . Potassium 08/10/2012 3.9  3.5 - 5.1 mEq/L Final  . Chloride 08/10/2012 100  96 - 112 mEq/L Final  . CO2 08/10/2012 27  19 - 32 mEq/L Final  . Glucose, Bld 08/10/2012 94  70 - 99 mg/dL Final  . BUN 65/78/4696 27* 6 - 23 mg/dL Final  . Creatinine, Ser 08/10/2012 1.33  0.50 - 1.35 mg/dL Final  . Calcium 29/52/8413 9.4  8.4 - 10.5 mg/dL Final  . GFR calc non Af Amer 08/10/2012 49* >90 mL/min Final  . GFR calc Af Amer 08/10/2012 57* >90 mL/min Final   Comment:                                 The eGFR has been calculated                          using the CKD EPI equation.                          This calculation has not been                          validated in all clinical                          situations.                          eGFR's persistently                          <90 mL/min  signify                          possible Chronic Kidney Disease.  . WBC 08/10/2012 8.2  4.0 - 10.5 K/uL Final  . RBC 08/10/2012 3.24* 4.22 - 5.81 MIL/uL Final  . Hemoglobin 08/10/2012 9.8* 13.0 - 17.0 g/dL Final   Comment: DELTA CHECK NOTED                          REPEATED TO VERIFY                          POST TRANSFUSION SPECIMEN  . HCT 08/10/2012 27.7* 39.0 - 52.0 % Final  . MCV 08/10/2012 85.5  78.0 - 100.0 fL Final  . MCH 08/10/2012 30.2  26.0 - 34.0 pg Final  . MCHC 08/10/2012 35.4  30.0 - 36.0 g/dL Final  . RDW 24/40/1027 14.8  11.5 - 15.5 % Final  . Platelets 08/10/2012 150  150 - 400 K/uL Final  Hospital Outpatient Visit on 07/30/2012  Component Date Value Range Status  . aPTT 07/30/2012 36  24 - 37 seconds Final  . WBC 07/30/2012 6.3  4.0 - 10.5 K/uL Final  . RBC 07/30/2012 4.33  4.22 - 5.81 MIL/uL Final  . Hemoglobin 07/30/2012 12.7* 13.0 - 17.0 g/dL Final  . HCT 96/09/5407 38.3* 39.0 - 52.0 % Final  . MCV 07/30/2012 88.5  78.0 - 100.0 fL Final  . MCH 07/30/2012 29.3  26.0 - 34.0 pg Final  . MCHC 07/30/2012 33.2  30.0 - 36.0 g/dL Final  . RDW 81/19/1478 12.8  11.5 - 15.5 % Final  . Platelets 07/30/2012 232  150 - 400 K/uL Final  . Sodium 07/30/2012 137  135 - 145 mEq/L Final  . Potassium 07/30/2012 4.3  3.5 - 5.1 mEq/L Final  . Chloride 07/30/2012 102  96 - 112 mEq/L Final  . CO2 07/30/2012 24  19 - 32 mEq/L Final  . Glucose, Bld 07/30/2012 102* 70 - 99 mg/dL Final  . BUN 29/56/2130 32* 6 - 23 mg/dL Final  . Creatinine, Ser 07/30/2012 1.39* 0.50 - 1.35 mg/dL Final  . Calcium 86/57/8469 10.7* 8.4 - 10.5 mg/dL Final  . Total Protein 07/30/2012 7.3  6.0 - 8.3 g/dL Final  . Albumin 62/95/2841 3.9  3.5 - 5.2 g/dL Final  . AST 32/44/0102 19  0 - 37 U/L Final  . ALT 07/30/2012 10  0 - 53 U/L Final  . Alkaline Phosphatase 07/30/2012 90  39 - 117 U/L Final  . Total Bilirubin 07/30/2012 0.6  0.3 - 1.2 mg/dL Final  . GFR calc non Af Amer 07/30/2012 46* >90 mL/min Final  .  GFR calc Af Amer 07/30/2012 54* >90 mL/min Final   Comment:                                 The eGFR has been calculated                          using the CKD EPI equation.                          This calculation has not been                          validated in all clinical                          situations.                          eGFR's persistently                          <90 mL/min signify                          possible Chronic Kidney Disease.  Marland Kitchen Prothrombin Time 07/30/2012 13.4  11.6 - 15.2 seconds Final  . INR 07/30/2012 1.03  0.00 - 1.49 Final  . ABO/RH(D) 07/30/2012 O POS   Final  . Antibody Screen 07/30/2012 NEG   Final  . Sample Expiration 07/30/2012 08/09/2012   Final  . Unit Number 07/30/2012 [id number]  Final  . Blood Component Type 07/30/2012 RED CELLS,LR   Final  . Unit division 07/30/2012 00   Final  . Status of Unit 07/30/2012 ISSUED   Final  . Transfusion Status 07/30/2012 OK TO TRANSFUSE   Final  . Crossmatch Result 07/30/2012 Compatible   Final  . Unit Number 07/30/2012 [id number]   Final  . Blood Component Type 07/30/2012 RED CELLS,LR   Final  . Unit division 07/30/2012 00   Final  . Status of Unit 07/30/2012 ISSUED   Final  . Transfusion Status 07/30/2012 OK TO TRANSFUSE   Final  . Crossmatch Result 07/30/2012 Compatible   Final  . Color, Urine 07/30/2012 YELLOW  YELLOW Final  . APPearance 07/30/2012 CLEAR  CLEAR Final  . Specific Gravity, Urine 07/30/2012 1.017  1.005 - 1.030 Final  . pH 07/30/2012 5.0  5.0 - 8.0 Final  . Glucose, UA 07/30/2012 NEGATIVE  NEGATIVE mg/dL Final  . Hgb urine dipstick 07/30/2012 NEGATIVE  NEGATIVE Final  . Bilirubin Urine 07/30/2012 NEGATIVE  NEGATIVE Final  . Ketones, ur 07/30/2012 NEGATIVE  NEGATIVE mg/dL Final  . Protein, ur 11/91/4782 NEGATIVE  NEGATIVE mg/dL Final  . Urobilinogen, UA 07/30/2012 0.2  0.0 - 1.0 mg/dL Final  . Nitrite 95/62/1308 NEGATIVE  NEGATIVE Final  . Leukocytes, UA 07/30/2012 NEGATIVE   NEGATIVE Final   MICROSCOPIC NOT DONE ON URINES WITH NEGATIVE PROTEIN, BLOOD, LEUKOCYTES, NITRITE, OR GLUCOSE <1000 mg/dL.  Marland Kitchen MRSA, PCR 07/30/2012 INVALID RESULTS, SPECIMEN SENT FOR CULTURE* NEGATIVE Final  . Staphylococcus aureus 07/30/2012 INVALID RESULTS, SPECIMEN SENT FOR CULTURE* NEGATIVE Final   Comment:                                 The Xpert SA Assay (FDA                          approved for NASAL specimens                          in patients over 37 years of age),                          is one component of                          a comprehensive surveillance                          program.  Test performance has                          been validated by [employer] for patients greater                          than or equal to 65 year old.                          It is not intended  to diagnose infection nor to                          guide or monitor treatment.  . ABO/RH(D) 07/30/2012 O POS   Final  . Specimen Description 07/30/2012 NOSE   Final  . Special Requests 07/30/2012 NONE   Final  . Culture 07/30/2012    Final                   Value:NO STAPHYLOCOCCUS AUREUS ISOLATED                         Note: No MRSA Isolated  . Report Status 07/30/2012 08/02/2012 FINAL   Final     X-Rays:Dg Chest 2 View  07/22/2012  *RADIOLOGY REPORT*  Clinical Data: Severe osteoarthritis of the right hip. Preoperative respiratory exam.  CHEST - 2 VIEW  Comparison: 08/28 and 02/07/2008  Findings: The heart size and pulmonary vascularity are normal and the lungs are clear except for a small nodule in the right midzone which is stable since a CT scan dated 12/21/2007.  Bilateral nipple shadows present.  Density in the left upper lobe adjacent to the arch of the aorta is and end on blood vessel.  The lungs are hyperinflated consistent with emphysema.  IMPRESSION: No acute abnormalities.  Emphysema.   Original Report Authenticated By: Francene Boyers, M.D.    Dg Hip Complete Right  07/30/2012  *RADIOLOGY REPORT*  Clinical Data: 79 year old male preoperative study for hip surgery.  RIGHT HIP - COMPLETE 2+ VIEW  Comparison: [facility] right hip series 07/02/2012.  Findings: Severe loss of the right hip joint space with protrusio acetabuli, subchondral sclerosis, large osteophytes, and large subchondral cysts again noted.  Stable configuration of the proximal right femur.  No acute fracture identified.  Pelvis intact.  Lower lumbar disc and endplate degeneration.  Negative for age radiographic appearance of the left hip.  IMPRESSION: Stable advanced degenerative changes at the right hip.   Original Report Authenticated By: Erskine Speed, M.D.    Dg Pelvis Portable  08/06/2012  *RADIOLOGY REPORT*  Clinical Data: Post right hip replacement  PORTABLE PELVIS  Comparison: Portable exam 1109 hours compared to 07/30/2012  Findings: Osseous demineralization. Interval resection of the proximal right femur with placement of acetabular and femoral components of a right hip prosthesis. Tip of the femoral component is excluded. Within this limitation, no fracture or dislocation identified on single AP view. Surgical drain in expected soft tissue changes noted.  IMPRESSION: Right hip prosthesis without acute complication; tip of femoral component not imaged.   Original Report Authenticated By: Ulyses Southward, M.D.    Dg Hip Portable 1 View Right  08/06/2012  *RADIOLOGY REPORT*  Clinical Data: Right hip replacement.  PORTABLE RIGHT HIP - 1 VIEW  Comparison: Plain films 07/30/2012.  Findings: The patient has a new right total hip arthroplasty.  The device is located and there is no fracture.  Gas and soft tissues and surgical drain noted.  IMPRESSION: Right total hip replacement without complicating feature.   Original Report Authenticated By: Holley Dexter, M.D.     EKG: Orders placed in visit on 07/22/12  . EKG 12-LEAD  . EKG 12-LEAD     Hospital  Course: Patient was admitted to [facility] and taken to the OR and underwent the above state procedure without complications.  Patient tolerated the procedure well and was later transferred to  the recovery room and then to the orthopaedic floor for postoperative care.  They were given PO and IV analgesics for pain control following their surgery.  They were given 24 hours of postoperative antibiotics of  Anti-infectives   Start     Dose/Rate Route Frequency Ordered Stop   08/06/12 1515  ceFAZolin (ANCEF) IVPB 1 g/50 mL premix     1 g 100 mL/hr over 30 Minutes Intravenous Every 6 hours 08/06/12 1218 08/06/12 2137   08/06/12 0700  ceFAZolin (ANCEF) IVPB 2 g/50 mL premix     2 g 100 mL/hr over 30 Minutes Intravenous On call to O.R. 08/06/12 1610 08/06/12 0909     and started on DVT prophylaxis in the form of Xarelto.   PT and OT were ordered for total hip protocol.  The patient was allowed to be WBAT with therapy. Discharge planning was consulted to help with postop disposition and equipment needs.  Patient had a decent night on the evening of surgery and started to get up OOB with therapy on day one and walked over 40 feet and then over 80 feet.  Hemovac drain was pulled without difficulty.  The knee immobilizer was removed and discontinued.  Continued to work with therapy into day two.  Noted to have acute blood postoperative blood loss anemia but asymptomatic and tolerating it well with activities and vital signs were stable on day two. Developed postoperative urinary retention. Foley cath placed and kept and 24 hours. Continued out of bed with physical therapy. Dressing was changed on day two and the incision was healing well.  By day three, the patient had some minor complaints of dizziness with ambulation. Transfused two units of blood. Incision was healing well. Continued foley until blood transfusion was completed.  Foley removed.  Unable to void later that evening again. Attempted In and  Out straight cath with some resistance.  Foley replaced and left to gravity.  Seen the following morning by Dr. Lequita Halt.  Felt a little better after blood. He also has a sacral decub, stage 1, which has had different types of dressings applied during the hospital course. His daughter, who is in the room at bedside, stated that every type of dressing has been tried and rolls up and comes off when he moves around in the bed, which is also probably some of the reason why he continues to have irritation on his sacral area. Need to encourage him to log roll and take pressure off of the area. Will ask SNF to have their wound care team look at the area and continue to try and keep covered.  Patient was having problems with voiding and had to have the foely relaced last night. Recommended leaving the foley in place for a couple of days and then trying a voiding trial. If unable to void still, then would need to have Urology appointment setup. He states that he has been seen in the past over at [facility] but does not recall by whom.  Patient was seen in rounds and was ready to go to the SNF at this time.    Discharge Medications: Prior to Admission medications   Medication Sig Start Date End Date Taking? Authorizing Provider  amLODipine (NORVASC) 5 MG tablet Take 5 mg by mouth every evening.   Yes Historical Provider, MD  docusate sodium (COLACE) 100 MG capsule Take 100 mg by mouth 2 (two) times daily.   Yes Historical Provider, MD  levothyroxine (SYNTHROID, LEVOTHROID) 75 MCG tablet  Take 75 mcg by mouth daily before breakfast.   Yes Historical Provider, MD  losartan-hydrochlorothiazide (HYZAAR) 100-12.5 MG per tablet Take 1 tablet by mouth daily before breakfast.   Yes Historical Provider, MD  omeprazole (PRILOSEC) 20 MG capsule Take 1 capsule (20 mg total) by mouth daily. 01/23/12  Yes Kristian Covey, MD  acetaminophen (TYLENOL) 325 MG tablet Take 2 tablets (650 mg total) by mouth every 6 (six) hours as  needed. 08/10/12   Shandon Matson Julien Girt, PA  bisacodyl (DULCOLAX) 10 MG suppository Place 1 suppository (10 mg total) rectally daily as needed. 08/10/12   Mansa Willers Julien Girt, PA  ferrous sulfate 325 (65 FE) MG tablet Take 1 tablet (325 mg total) by mouth 2 (two) times daily with a meal. 08/10/12   Lazarius Rivkin Julien Girt, PA  magnesium hydroxide (MILK OF MAGNESIA) 400 MG/5ML suspension Take 30 mLs by mouth every 6 (six) hours as needed. 08/10/12   Nashly Olsson Julien Girt, PA  methocarbamol (ROBAXIN) 500 MG tablet Take 1 tablet (500 mg total) by mouth every 6 (six) hours as needed. 08/10/12   Margia Wiesen Julien Girt, PA  ondansetron (ZOFRAN) 4 MG tablet Take 1 tablet (4 mg total) by mouth every 6 (six) hours as needed for nausea. 08/10/12   Murrell Elizondo Julien Girt, PA  oxyCODONE (OXY IR/ROXICODONE) 5 MG immediate release tablet Take 1-2 tablets (5-10 mg total) by mouth every 3 (three) hours as needed. 08/10/12   Latresha Yahr Julien Girt, PA  polyethylene glycol (MIRALAX / GLYCOLAX) packet Take 17 g by mouth daily as needed. 08/10/12   Hawley Pavia Julien Girt, PA  rivaroxaban (XARELTO) 10 MG TABS tablet Take 1 tablet (10 mg total) by mouth daily with breakfast. Take Xarelto for two and a half more weeks, then discontinue Xarelto. Once the patient has completed the Xarelto, they may resume the 81 mg Aspirin. 08/10/12   Daysha Ashmore Julien Girt, PA  traMADol (ULTRAM) 50 MG tablet Take 1-2 tablets (50-100 mg total) by mouth every 6 (six) hours as needed (mild pain). 08/10/12   Jordynn Perrier Julien Girt, PA    Diet: Cardiac diet Activity:WBAT No bending hip over 90 degrees- A "L" Angle Do not cross legs Do not let foot roll inward When turning these patients a pillow should be placed between the patient's legs to prevent crossing. Patients should have the affected knee fully extended when trying to sit or stand from all surfaces to prevent excessive hip flexion. When ambulating and turning toward the affected side the affected leg should have the  toes turned out prior to moving the walker and the rest of patient's body as to prevent internal rotation/ turning in of the leg. Abduction pillows are the most effective way to prevent a patient from not crossing legs or turning toes in at rest. If an abduction pillow is not ordered placing a regular pillow length wise between the patient's legs is also an effective reminder. It is imperative that these precautions be maintained so that the surgical hip does not dislocate. Follow-up:in 2 weeks Disposition - Skilled nursing facility Discharged Condition: stable       Discharge Orders   Future Orders Complete By Expires     Call MD / Call 911  As directed     Comments:      If you experience chest pain or shortness of breath, CALL 911 and be transported to the hospital emergency room.  If you develope a fever above 101 F, pus (white drainage) or increased drainage or redness at the wound, or calf pain, call your surgeon's office.  Change dressing  As directed     Comments:      You may change your dressing dressing daily with sterile 4 x 4 inch gauze dressing and paper tape.  Do not submerge the incision under water.    Constipation Prevention  As directed     Comments:      Drink plenty of fluids.  Prune juice may be helpful.  You may use a stool softener, such as Colace (over the counter) 100 mg twice a day.  Use MiraLax (over the counter) for constipation as needed.    Diet - low sodium heart healthy  As directed     Discharge instructions  As directed     Comments:      Pick up stool softner and laxative for home. Do not submerge incision under water. May shower. Continue to use ice for pain and swelling from surgery. Hip precautions.  Total Hip Protocol.  Take Xarelto for two and a half more weeks, then discontinue Xarelto. Once the patient has completed the Xarelto, they may resume the 81 mg Aspirin.  When discharged from the skilled rehab facility, please have the facility  set up the patient's Home Health Physical Therapy prior to being released.  Also provide the patient with their medications at time of release from the facility to include their pain medication, the muscle relaxants, and their blood thinner medication.  If the patient is still at the rehab facility at time of follow up appointment, please also assist the patient in arranging follow up appointment in our office and any transportation needs.    Do not sit on low chairs, stoools or toilet seats, as it may be difficult to get up from low surfaces  As directed     Driving restrictions  As directed     Comments:      No driving until released by the physician.    Follow the hip precautions as taught in Physical Therapy  As directed     Increase activity slowly as tolerated  As directed     Lifting restrictions  As directed     Comments:      No lifting until released by the physician.    Patient may shower  As directed     Comments:      You may shower without a dressing once there is no drainage.  Do not wash over the wound.  If drainage remains, do not shower until drainage stops.    TED hose  As directed     Comments:      Use stockings (TED hose) for 3 weeks on both leg(s).  You may remove them at night for sleeping.    Weight bearing as tolerated  As directed         Medication List    STOP taking these medications       aspirin 81 MG tablet     Fish Oil 1200 MG Caps     multivitamin with minerals Tabs      TAKE these medications       acetaminophen 325 MG tablet  Commonly known as:  TYLENOL  Take 2 tablets (650 mg total) by mouth every 6 (six) hours as needed.     amLODipine 5 MG tablet  Commonly known as:  NORVASC  Take 5 mg by mouth every evening.     bisacodyl 10 MG suppository  Commonly known as:  DULCOLAX  Place 1 suppository (10 mg total) rectally  daily as needed.     docusate sodium 100 MG capsule  Commonly known as:  COLACE  Take 100 mg by mouth 2 (two) times  daily.     ferrous sulfate 325 (65 FE) MG tablet  Take 1 tablet (325 mg total) by mouth 2 (two) times daily with a meal.     levothyroxine 75 MCG tablet  Commonly known as:  SYNTHROID, LEVOTHROID  Take 75 mcg by mouth daily before breakfast.     losartan-hydrochlorothiazide 100-12.5 MG per tablet  Commonly known as:  HYZAAR  Take 1 tablet by mouth daily before breakfast.     magnesium hydroxide 400 MG/5ML suspension  Commonly known as:  MILK OF MAGNESIA  Take 30 mLs by mouth every 6 (six) hours as needed.     methocarbamol 500 MG tablet  Commonly known as:  ROBAXIN  Take 1 tablet (500 mg total) by mouth every 6 (six) hours as needed.     omeprazole 20 MG capsule  Commonly known as:  PRILOSEC  Take 1 capsule (20 mg total) by mouth daily.     ondansetron 4 MG tablet  Commonly known as:  ZOFRAN  Take 1 tablet (4 mg total) by mouth every 6 (six) hours as needed for nausea.     oxyCODONE 5 MG immediate release tablet  Commonly known as:  Oxy IR/ROXICODONE  Take 1-2 tablets (5-10 mg total) by mouth every 3 (three) hours as needed.     polyethylene glycol packet  Commonly known as:  MIRALAX / GLYCOLAX  Take 17 g by mouth daily as needed.     rivaroxaban 10 MG Tabs tablet  Commonly known as:  XARELTO  Take 1 tablet (10 mg total) by mouth daily with breakfast. Take Xarelto for two and a half more weeks, then discontinue Xarelto.  Once the patient has completed the Xarelto, they may resume the 81 mg Aspirin.     traMADol 50 MG tablet  Commonly known as:  ULTRAM  Take 1-2 tablets (50-100 mg total) by mouth every 6 (six) hours as needed (mild pain).       Follow-up Information   Follow up with Loanne Drilling, MD. Schedule an appointment as soon as possible for a visit on 08/20/2012.   Contact information:   [address], [address] [place] [address] [phone]       Signed: Patrica Duel 08/10/2012, 8:57 AM

## 2012-08-10 NOTE — Progress Notes (Signed)
Physical Therapy Treatment Patient Details Name: Isaiah Graham MRN: [id number] DOB: 1929-08-15 Today's Date: 08/10/2012 Time: 7829-5621 PT Time Calculation (min): 29 min  PT Assessment / Plan / Recommendation Comments on Treatment Session  POD #4 R Posterior THR planning to D/C to [facility] today.  Assisted pt OOB to amb limited distance in hallway then positioned in recliner.    Follow Up Recommendations  SNF     Does the patient have the potential to tolerate intense rehabilitation     Barriers to Discharge        Equipment Recommendations  None recommended by PT    Recommendations for Other Services    Frequency 7X/week   Plan Discharge plan remains appropriate    Precautions / Restrictions Precautions Precautions: Posterior Hip Precaution Comments: pt able to recall 3/3 THP Restrictions Weight Bearing Restrictions: No Other Position/Activity Restrictions: WBAT   Pertinent Vitals/Pain C/o 8/10 R hip pain    Mobility  Bed Mobility Bed Mobility: Supine to Sit;Sitting - Scoot to Edge of Bed Supine to Sit: 4: Min guard Details for Bed Mobility Assistance: Min Guard assist for R LE off bed and increased time Transfers Transfers: Sit to Stand;Stand to Sit Sit to Stand: 4: Min assist;4: Min guard Stand to Sit: 4: Min assist;4: Min guard Details for Transfer Assistance: 50% VC's on proper tech and hand placement esp with stand to sit for increased safety. Ambulation/Gait Ambulation/Gait Assistance: 4: Min guard;4: Min Environmental consultant (Feet): 64 Feet Assistive device: Rolling walker Ambulation/Gait Assistance Details: 25% VC's on proper walker to self distance and safety with turns and backward gait to chair. Gait Pattern: Step-to pattern;Decreased stride length;Antalgic Gait velocity: decreased     PT Goals                                          progressing    Visit Information  Last PT Received On: 08/10/12    Subjective Data       Cognition       Balance   fair  End of Session PT - End of Session Equipment Utilized During Treatment: Gait belt Activity Tolerance: Patient limited by pain Patient left: in chair;with call bell/phone within reach;with nursing in room   Felecia Shelling  PTA [facility]  Acute  Rehab Pager      [phone]

## 2012-08-10 NOTE — Progress Notes (Signed)
Clinical Social Work Department CLINICAL SOCIAL WORK PLACEMENT NOTE 08/10/2012  Patient:  Isaiah Graham, Isaiah Graham  Account Number:  [id number] Admit date:  08/06/2012  Clinical Social Worker:  Leron Croak, CLINICAL SOCIAL WORKER  Date/time:  08/08/2012 12:00 M  Clinical Social Work is seeking post-discharge placement for this patient at the following level of care:   SKILLED NURSING   (*CSW will update this form in Epic as items are completed)   08/08/2012  Patient/family provided with [facility] Department of Clinical Social Work's list of facilities offering this level of care within the geographic area requested by the patient (or if unable, by the patient's family).  08/08/2012  Patient/family informed of their freedom to choose among providers that offer the needed level of care, that participate in Medicare, Medicaid or managed care program needed by the patient, have an available bed and are willing to accept the patient.  08/08/2012  Patient/family informed of [facility]' ownership interest in [facility], as well as of the fact that they are under no obligation to receive care at this facility.  PASARR submitted to EDS on 08/08/2012 PASARR number received from EDS on 08/08/2012  FL2 transmitted to all facilities in geographic area requested by pt/family on  08/08/2012 FL2 transmitted to all facilities within larger geographic area on 08/08/2012  Patient informed that his/her managed care company has contracts with or will negotiate with  certain facilities, including the following:     Patient/family informed of bed offers received:  08/09/2012 Patient chooses bed at [facility] Physician recommends and patient chooses bed at    Patient to be transferred to [facility] on  08/10/2012 Patient to be transferred to facility by [facility]  The following physician request were entered in Epic:   Additional Comments:  Cori Razor LCSW [phone]

## 2012-08-10 NOTE — Progress Notes (Signed)
Subjective: 4 Days Post-Op Procedure(s) (LRB): RIGHT TOTAL HIP ARTHOPLASTY WITH ACETABULAR AUTOGRAFT (Right) Patient reports pain as mild.   Patient seen in rounds with Dr. Lequita Halt.  Family in room.  Will send with foley in place.  He also has a sacral decub, stage 1, which has had different types of dressings applied during the hospital course.  His daughter, who is in the room at bedside, states that every type of dressing rolls up and comes off when he moves around in the bed, which is also probably some of the reason why he continues to have irritation on his sacral area.  Need to encourage him to log roll and take pressure off of the area.  Will ask SNF to have their wound care team look at the area and continue to try and keep covered. Patient is having problems with voiding and had to have the foely relaced last night.  Would recommend leaving the foley in place for a couple of days and then trying a voiding trial.  If unable to void still, then would need to have Urology appointment setup.  He states that he has been seen in the past over at [facility] but does not recall by whom. Patient is ready to go the SNF.    Objective: Vital signs in last 24 hours: Temp:  [96.7 F (35.9 C)-98.5 F (36.9 C)] 98.5 F (36.9 C) (02/18 0459) Pulse Rate:  [58-75] 64 (02/18 0459) Resp:  [14-18] 16 (02/18 0459) BP: (112-155)/(52-67) 155/55 mmHg (02/18 0459) SpO2:  [96 %-99 %] 96 % (02/18 0459)  Intake/Output from previous day:  Intake/Output Summary (Last 24 hours) at 08/10/12 0832 Last data filed at 08/10/12 0548  Gross per 24 hour  Intake    385 ml  Output   2501 ml  Net  -2116 ml    Intake/Output this shift:    Labs:  Recent Labs  08/08/12 0435 08/09/12 0419 08/10/12 0507  HGB 8.1* 7.6* 9.8*    Recent Labs  08/09/12 0419 08/10/12 0507  WBC 8.4 8.2  RBC 2.39* 3.24*  HCT 21.3* 27.7*  PLT 125* 150    Recent Labs  08/08/12 0435 08/10/12 0507  NA 131* 137  K 4.3  3.9  CL 102 100  CO2 23 27  BUN 25* 27*  CREATININE 1.43* 1.33  GLUCOSE 115* 94  CALCIUM 9.2 9.4   No results found for this basename: LABPT, INR,  in the last 72 hours  EXAM: General - Patient is Alert, Appropriate and Oriented Extremity - Neurovascular intact Sensation intact distally Dorsiflexion/Plantar flexion intact No cellulitis present Incision - clean, dry, no drainage, healing Motor Function - intact, moving foot and toes well on exam.   Assessment/Plan: 4 Days Post-Op Procedure(s) (LRB): RIGHT TOTAL HIP ARTHOPLASTY WITH ACETABULAR AUTOGRAFT (Right) Procedure(s) (LRB): RIGHT TOTAL HIP ARTHOPLASTY WITH ACETABULAR AUTOGRAFT (Right) Past Medical History  Diagnosis Date  . Hypertension   . Chronic kidney disease     kidney stones   . Esophageal cancer 1990  . Hypothyroidism   . Inguinal hernia February 2013    right  . Esophageal stricture   . Food impaction of esophagus   . Barrett esophagus   . Gastroparesis   . Aortic aneurysm, abdominal     repair  . Arthritis    Principal Problem:   OA (osteoarthritis) of hip Active Problems:   Postoperative anemia due to acute blood loss   Postop Hyponatremia   Postop Transfusion   Decubitus  ulcer of sacral region, stage 1  Estimated body mass index is 20.36 kg/(m^2) as calculated from the following:   Height as of this encounter: 5\' 7"  (1.702 m).   Weight as of this encounter: 58.968 kg (130 lb). Up with therapy Discharge to SNF Diet - Cardiac diet Follow up - in 2 weeks Activity - WBAT Disposition - Skilled nursing facility Condition Upon Discharge - Good D/C Meds - See DC Summary DVT Prophylaxis - Xarelto  Bernardine Langworthy 08/10/2012, 8:32 AM

## 2012-08-10 NOTE — Progress Notes (Signed)
Unable to void.  Attempt In and Out straight cath with some resistance. Will place a call to the on call physician assistant regard the urinary retention.

## 2012-08-14 ENCOUNTER — Emergency Department (HOSPITAL_COMMUNITY)
Admission: EM | Admit: 2012-08-14 | Discharge: 2012-08-14 | Disposition: A | Discharge status: Home / Self Care | Payer: Medicare Other | Attending: Emergency Medicine | Admitting: Emergency Medicine

## 2012-08-14 ENCOUNTER — Encounter (HOSPITAL_COMMUNITY): Payer: Self-pay

## 2012-08-14 ENCOUNTER — Emergency Department (HOSPITAL_COMMUNITY): Payer: Medicare Other

## 2012-08-14 DIAGNOSIS — Z96649 Presence of unspecified artificial hip joint: Secondary | ICD-10-CM | POA: Insufficient documentation

## 2012-08-14 DIAGNOSIS — Z8679 Personal history of other diseases of the circulatory system: Secondary | ICD-10-CM | POA: Insufficient documentation

## 2012-08-14 DIAGNOSIS — Z79899 Other long term (current) drug therapy: Secondary | ICD-10-CM | POA: Insufficient documentation

## 2012-08-14 DIAGNOSIS — Z8501 Personal history of malignant neoplasm of esophagus: Secondary | ICD-10-CM | POA: Insufficient documentation

## 2012-08-14 DIAGNOSIS — E039 Hypothyroidism, unspecified: Secondary | ICD-10-CM | POA: Insufficient documentation

## 2012-08-14 DIAGNOSIS — Z87891 Personal history of nicotine dependence: Secondary | ICD-10-CM | POA: Insufficient documentation

## 2012-08-14 DIAGNOSIS — Z8739 Personal history of other diseases of the musculoskeletal system and connective tissue: Secondary | ICD-10-CM | POA: Insufficient documentation

## 2012-08-14 DIAGNOSIS — N390 Urinary tract infection, site not specified: Secondary | ICD-10-CM | POA: Insufficient documentation

## 2012-08-14 DIAGNOSIS — I129 Hypertensive chronic kidney disease with stage 1 through stage 4 chronic kidney disease, or unspecified chronic kidney disease: Secondary | ICD-10-CM | POA: Insufficient documentation

## 2012-08-14 DIAGNOSIS — K5900 Constipation, unspecified: Secondary | ICD-10-CM

## 2012-08-14 DIAGNOSIS — N189 Chronic kidney disease, unspecified: Secondary | ICD-10-CM | POA: Insufficient documentation

## 2012-08-14 DIAGNOSIS — Z8719 Personal history of other diseases of the digestive system: Secondary | ICD-10-CM | POA: Insufficient documentation

## 2012-08-14 LAB — URINALYSIS, ROUTINE W REFLEX MICROSCOPIC
Bilirubin Urine: NEGATIVE
Glucose, UA: NEGATIVE mg/dL
Ketones, ur: NEGATIVE mg/dL
Nitrite: POSITIVE — AB
Protein, ur: NEGATIVE mg/dL
Specific Gravity, Urine: 1.017 (ref 1.005–1.030)
Urobilinogen, UA: 1 mg/dL (ref 0.0–1.0)
pH: 5.5 (ref 5.0–8.0)

## 2012-08-14 LAB — CBC WITH DIFFERENTIAL/PLATELET
Basophils Absolute: 0 10*3/uL (ref 0.0–0.1)
Basophils Relative: 0 % (ref 0–1)
Eosinophils Absolute: 0.1 10*3/uL (ref 0.0–0.7)
Eosinophils Relative: 1 % (ref 0–5)
HCT: 31.1 % — ABNORMAL LOW (ref 39.0–52.0)
Hemoglobin: 10.5 g/dL — ABNORMAL LOW (ref 13.0–17.0)
Lymphocytes Relative: 7 % — ABNORMAL LOW (ref 12–46)
Lymphs Abs: 0.6 10*3/uL — ABNORMAL LOW (ref 0.7–4.0)
MCH: 29.3 pg (ref 26.0–34.0)
MCHC: 33.8 g/dL (ref 30.0–36.0)
MCV: 86.9 fL (ref 78.0–100.0)
Monocytes Absolute: 0.7 10*3/uL (ref 0.1–1.0)
Monocytes Relative: 8 % (ref 3–12)
Neutro Abs: 7.3 10*3/uL (ref 1.7–7.7)
Neutrophils Relative %: 85 % — ABNORMAL HIGH (ref 43–77)
Platelets: 267 10*3/uL (ref 150–400)
RBC: 3.58 MIL/uL — ABNORMAL LOW (ref 4.22–5.81)
RDW: 13.7 % (ref 11.5–15.5)
WBC: 8.6 10*3/uL (ref 4.0–10.5)

## 2012-08-14 LAB — URINE MICROSCOPIC-ADD ON

## 2012-08-14 LAB — BASIC METABOLIC PANEL
BUN: 27 mg/dL — ABNORMAL HIGH (ref 6–23)
CO2: 28 mEq/L (ref 19–32)
Calcium: 9.9 mg/dL (ref 8.4–10.5)
Chloride: 96 mEq/L (ref 96–112)
Creatinine, Ser: 1.39 mg/dL — ABNORMAL HIGH (ref 0.50–1.35)
GFR calc Af Amer: 54 mL/min — ABNORMAL LOW (ref >90)
GFR calc non Af Amer: 46 mL/min — ABNORMAL LOW (ref >90)
Glucose, Bld: 133 mg/dL — ABNORMAL HIGH (ref 70–99)
Potassium: 4.3 mEq/L (ref 3.5–5.1)
Sodium: 133 mEq/L — ABNORMAL LOW (ref 135–145)

## 2012-08-14 MED ORDER — ONDANSETRON HCL 4 MG/2ML IJ SOLN
4.0000 mg | Freq: Once | INTRAMUSCULAR | Status: AC
  Administered 2012-08-14: 4 mg via INTRAVENOUS

## 2012-08-14 MED ORDER — DOCUSATE SODIUM 100 MG PO CAPS: 100.0000 mg | ORAL_CAPSULE | Freq: Two times a day (BID) | ORAL | Status: DC

## 2012-08-14 MED ORDER — CEPHALEXIN 500 MG PO CAPS: 500.0000 mg | ORAL_CAPSULE | Freq: Four times a day (QID) | ORAL | Status: DC

## 2012-08-14 MED ORDER — SODIUM CHLORIDE 0.9 % IV BOLUS (SEPSIS)
500.0000 mL | INTRAVENOUS | Status: AC
  Administered 2012-08-14: 500 mL via INTRAVENOUS

## 2012-08-14 MED ORDER — BISACODYL 10 MG RE SUPP: 10.0000 mg | RECTAL | Status: DC | PRN

## 2012-08-14 MED ORDER — FLEET ENEMA 7-19 GM/118ML RE ENEM
1.0000 | ENEMA | Freq: Once | RECTAL | Status: AC
  Administered 2012-08-14: 1 via RECTAL
  Filled 2012-08-14: qty 1

## 2012-08-14 MED ORDER — ONDANSETRON HCL 4 MG/2ML IJ SOLN
INTRAMUSCULAR | Status: AC
  Administered 2012-08-14: 4 mg via INTRAVENOUS
  Filled 2012-08-14: qty 2

## 2012-08-14 NOTE — ED Notes (Signed)
New leg bag placed, awaiting urine output.

## 2012-08-14 NOTE — ED Notes (Signed)
RUE:AV40<JW> Expected date:08/14/12<BR> Expected time:12:35 PM<BR> Means of arrival:Ambulance<BR> Comments:<BR> S/p hip replacement, constipation post op 7 days

## 2012-08-14 NOTE — ED Notes (Signed)
Patient transported to X-ray 

## 2012-08-14 NOTE — ED Notes (Signed)
[facility] has been notified  

## 2012-08-14 NOTE — ED Notes (Addendum)
Enema administered and bedside commode in room.

## 2012-08-14 NOTE — ED Notes (Signed)
Per EMS, Pt, from [facility], c/o constipation x 1 week.  Sts he had hip surgery x 8 days ago.  Denies abdominal pain.  Sts nausea with movement.  Vitals are stable.  NAD noted.  Facility sts Pt given prune juice and milk of magnesia with no relief.

## 2012-08-14 NOTE — ED Notes (Signed)
PA at bedside.

## 2012-08-16 NOTE — ED Provider Notes (Signed)
History     CSN: [id number]  Arrival date & time 08/14/12  1237   First MD Initiated Contact with Patient 08/14/12 1241      Chief Complaint  Patient presents with  . Constipation    (Consider location/radiation/quality/duration/timing/severity/associated sxs/prior treatment) HPI The patient presents to the ER with constipation over the last 7 days. The patient had a hip replacement 8 days ago and since he started the narcotics and post op he has no had a BM. The patient had issues with this in the past. The patient did have a BM prior to discharge. The patient denies chest pain, sob, weakness, headache, vomiting, diarrhea, or fever. The patient has used miralax without relief. Past Medical History  Diagnosis Date  . Hypertension   . Chronic kidney disease     kidney stones   . Esophageal cancer 1990  . Hypothyroidism   . Inguinal hernia February 2013    right  . Esophageal stricture   . Food impaction of esophagus   . Barrett esophagus   . Gastroparesis   . Aortic aneurysm, abdominal     repair  . Arthritis     Past Surgical History  Procedure Laterality Date  . Sp endo repair infraren aaa  8/09    es  . Coarctation of aorta repair  2009  . Inguinal hernia repair  09/01/11    bilateral  . Esophagogastroduodenoscopy  10/05/2011    Procedure: ESOPHAGOGASTRODUODENOSCOPY (EGD);  Surgeon: Meryl Dare, MD,FACG;  Location: [facility] END[facility]COPY;  Service: Endoscopy;  Laterality: N/A;  . Vagotomy    . Esophagectomy      partial  . Umbilical hernia repair    . Total hip arthroplasty Right 08/06/2012    Procedure: RIGHT TOTAL HIP ARTHOPLASTY WITH ACETABULAR AUTOGRAFT;  Surgeon: Loanne Drilling, MD;  Location: [facility] ORS;  Service: Orthopedics;  Laterality: Right;  RIGHT TOTAL HIP ARTHOPLASTY WITH ACETABULAR AUTOGRAFT     Family History  Problem Relation Age of Onset  . Pancreatic cancer Father   . Aneurysm Daughter     brain  . Glaucoma Sister   . Alzheimer's disease Sister      History  Substance Use Topics  . Smoking status: Former Smoker -- 0.50 packs/day for 22 years    Types: Cigarettes    Quit date: 12/04/1970  . Smokeless tobacco: Never Used  . Alcohol Use: No      Review of Systems All other systems negative except as documented in the HPI. All pertinent positives and negatives as reviewed in the HPI.  Allergies  Review of patient's allergies indicates no known allergies.  Home Medications   Current Outpatient Rx  Name  Route  Sig  Dispense  Refill  . amLODipine (NORVASC) 5 MG tablet   Oral   Take 5 mg by mouth every evening.         . bisacodyl (DULCOLAX) 10 MG suppository   Rectal   Place 1 suppository (10 mg total) rectally daily as needed.   12 suppository   0   . docusate sodium (COLACE) 100 MG capsule   Oral   Take 100 mg by mouth 2 (two) times daily.         . ferrous sulfate 325 (65 FE) MG tablet   Oral   Take 1 tablet (325 mg total) by mouth 2 (two) times daily with a meal.   30 tablet   0   . levothyroxine (SYNTHROID, LEVOTHROID) 75 MCG tablet   Oral  Take 75 mcg by mouth daily before breakfast.         . losartan-hydrochlorothiazide (HYZAAR) 100-12.5 MG per tablet   Oral   Take 1 tablet by mouth daily before breakfast.         . magnesium hydroxide (MILK OF MAGNESIA) 400 MG/5ML suspension   Oral   Take 30 mLs by mouth every 6 (six) hours as needed.   360 mL   0   . methocarbamol (ROBAXIN) 500 MG tablet   Oral   Take 500 mg by mouth every 6 (six) hours as needed.         Marland Kitchen omeprazole (PRILOSEC) 20 MG capsule   Oral   Take 1 capsule (20 mg total) by mouth daily.   90 capsule   3   . ondansetron (ZOFRAN) 4 MG tablet   Oral   Take 1 tablet (4 mg total) by mouth every 6 (six) hours as needed for nausea.   40 tablet   0   . oxyCODONE (OXY IR/ROXICODONE) 5 MG immediate release tablet   Oral   Take 5-10 mg by mouth every 3 (three) hours as needed.         . polyethylene glycol (MIRALAX /  GLYCOLAX) packet   Oral   Take 17 g by mouth daily as needed.   14 each   0   . rivaroxaban (XARELTO) 10 MG TABS tablet   Oral   Take 10 mg by mouth daily with breakfast. Take Xarelto for two and a half more weeks, then discontinue Xarelto. Once the patient has completed the Xarelto, they may resume the 81 mg Aspirin.         . traMADol (ULTRAM) 50 MG tablet   Oral   Take 1-2 tablets (50-100 mg total) by mouth every 6 (six) hours as needed (mild pain).   60 tablet   0   . bisacodyl (DULCOLAX) 10 MG suppository   Rectal   Place 1 suppository (10 mg total) rectally as needed for constipation.   12 suppository   0   . cephALEXin (KEFLEX) 500 MG capsule   Oral   Take 1 capsule (500 mg total) by mouth 4 (four) times daily.   28 capsule   0   . docusate sodium (COLACE) 100 MG capsule   Oral   Take 1 capsule (100 mg total) by mouth every 12 (twelve) hours.   60 capsule   0     BP 134/57  Pulse 67  Temp(Src) 97.9 F (36.6 C) (Oral)  Resp 16  Ht 5\' 7"  (1.702 m)  SpO2 95%  Physical Exam  Nursing note and vitals reviewed. Constitutional: He is oriented to person, place, and time. He appears well-developed and well-nourished. No distress.  HENT:  Head: Normocephalic and atraumatic.  Mouth/Throat: Oropharynx is clear and moist. No oropharyngeal exudate.  Eyes: Pupils are equal, round, and reactive to light.  Neck: Normal range of motion. Neck supple.  Cardiovascular: Normal rate and regular rhythm.  Exam reveals no gallop.   No murmur heard. Pulmonary/Chest: Effort normal and breath sounds normal.  Abdominal: Soft. Bowel sounds are normal. He exhibits no distension. There is no tenderness. There is no rebound and no guarding.  Genitourinary: Rectal exam shows no fissure, no tenderness and anal tone normal. Guaiac negative stool.  No stool noted in the rectal vault   Neurological: He is alert and oriented to person, place, and time.  Skin: Skin is warm and dry. No rash  noted.    ED Course  Procedures (including critical care time)  Labs Reviewed  CBC WITH DIFFERENTIAL - Abnormal; Notable for the following:    RBC 3.58 (*)    Hemoglobin 10.5 (*)    HCT 31.1 (*)    Neutrophils Relative 85 (*)    Lymphocytes Relative 7 (*)    Lymphs Abs 0.6 (*)    All other components within normal limits  BASIC METABOLIC PANEL - Abnormal; Notable for the following:    Sodium 133 (*)    Glucose, Bld 133 (*)    BUN 27 (*)    Creatinine, Ser 1.39 (*)    GFR calc non Af Amer 46 (*)    GFR calc Af Amer 54 (*)    All other components within normal limits  URINALYSIS, ROUTINE W REFLEX MICROSCOPIC - Abnormal; Notable for the following:    APPearance CLOUDY (*)    Hgb urine dipstick MODERATE (*)    Nitrite POSITIVE (*)    Leukocytes, UA MODERATE (*)    All other components within normal limits  URINE MICROSCOPIC-ADD ON - Abnormal; Notable for the following:    Bacteria, UA FEW (*)    All other components within normal limits  URINE CULTURE  OCCULT BLOOD, POC DEVICE   Dg Abd Acute W/chest  08/14/2012  *RADIOLOGY REPORT*  Clinical Data: Trouble with bowels.  No bowel movements for 7 days.  ACUTE ABDOMEN SERIES (ABDOMEN 2 VIEW & CHEST 1 VIEW)  Comparison: 02/13/2008 and chest film of 07/22/2012  Findings: Frontal view of the chest demonstrates surgical changes in the left paratracheal region.  Cardiomegaly with tortuous descending thoracic aorta. No pleural effusion or pneumothorax. Mild hyperinflation and interstitial thickening.  Possible nodular density projecting over the right midlung, suboptimally evaluated. Left base scarring.  Abominal films demonstrate no free intraperitoneal air on upright positioning.  Mild S-shaped thoracolumbar spine curvature.  No small bowel distention on supine imaging.  Large amount of colonic stool.  Rectal sigmoid stool burden is also moderate. Right hip arthroplasty.  IMPRESSION: Possible constipation.  COPD/chronic bronchitis.  Possible  right midlung nodular density of 8 mm.  Favor a granuloma or summation of ribs.  Consider plain film follow-up at 6 months.   Original Report Authenticated By: Jeronimo Greaves, M.D.      1. Constipation   2. UTI (lower urinary tract infection)     The patient is given enema and had a large BM.The patient will be referred back to his PCP. Told to return here as needed. Increase his fluid  Intake.   MDM          Carlyle Dolly, PA-C 08/16/12 1538

## 2012-08-17 LAB — URINE CULTURE

## 2012-08-17 NOTE — ED Provider Notes (Signed)
Medical screening examination/treatment/procedure(s) were conducted as a shared visit with non-physician practitioner(s) and myself.  I personally evaluated the patient during the encounter.  No acute abdomen. X-ray confirms constipation. Patient had a large bowel movement after intervention  Donnetta Hutching, MD 08/17/12 [phone]

## 2012-08-18 ENCOUNTER — Telehealth (HOSPITAL_COMMUNITY): Payer: Self-pay | Admitting: Emergency Medicine

## 2012-08-18 NOTE — ED Notes (Signed)
Patient has +Urine culture. Checking to see if appropriately treated. °

## 2012-08-18 NOTE — ED Notes (Signed)
+  Urine. Patient given Keflex. No sensitivity listed. Chart sent to EDP office for review. °

## 2012-08-25 NOTE — ED Notes (Signed)
Patient is in Rehab( in [place])

## 2012-09-24 ENCOUNTER — Other Ambulatory Visit: Payer: Self-pay | Admitting: Family Medicine

## 2012-09-27 ENCOUNTER — Telehealth: Payer: Self-pay | Admitting: Family Medicine

## 2012-09-27 NOTE — Telephone Encounter (Signed)
Pt had recent THR and poor appetite/weight loss following surgery.  Daughter requesting refill of Remeron which he was started on in rehab.  I have no record of dose, etc, Probably should get him in for follow up.  She did state he would run out in 2 days.  If we can get dose I would be OK to call in refill until we can see him.

## 2012-09-28 ENCOUNTER — Other Ambulatory Visit: Payer: Self-pay | Admitting: *Deleted

## 2012-09-28 MED ORDER — MIRTAZAPINE 15 MG PO TABS: 15.0000 mg | ORAL_TABLET | Freq: Every day | ORAL | Status: DC

## 2012-09-28 NOTE — Telephone Encounter (Signed)
I spoke with pt wife, Remeron 15 mg, one tab at HS daily.  Wife is scheduling OV for pt and herself in the next few days.  I did send #30 with 0 refills to their pharmacy.  Both are scheduled for Thursday, 4/10.  FYI only

## 2012-09-30 ENCOUNTER — Ambulatory Visit (INDEPENDENT_AMBULATORY_CARE_PROVIDER_SITE_OTHER): Payer: Medicare Other | Admitting: Family Medicine

## 2012-09-30 ENCOUNTER — Encounter: Payer: Self-pay | Admitting: Family Medicine

## 2012-09-30 VITALS — BP 130/50 | Temp 97.9°F | Wt 126.0 lb

## 2012-09-30 DIAGNOSIS — N400 Enlarged prostate without lower urinary tract symptoms: Secondary | ICD-10-CM

## 2012-09-30 DIAGNOSIS — I10 Essential (primary) hypertension: Secondary | ICD-10-CM

## 2012-09-30 MED ORDER — MIRTAZAPINE 15 MG PO TABS: 15.0000 mg | ORAL_TABLET | Freq: Every day | ORAL | Status: DC

## 2012-09-30 MED ORDER — TRAMADOL HCL 50 MG PO TABS: ORAL_TABLET | ORAL | Status: DC

## 2012-09-30 MED ORDER — AMLODIPINE BESYLATE 5 MG PO TABS: 5.0000 mg | ORAL_TABLET | Freq: Every evening | ORAL | Status: DC

## 2012-09-30 MED ORDER — TAMSULOSIN HCL 0.4 MG PO CAPS: 0.4000 mg | ORAL_CAPSULE | Freq: Every day | ORAL | Status: DC

## 2012-09-30 NOTE — Progress Notes (Signed)
  Subjective:    Patient ID: Isaiah Graham, male    DOB: 12-24-1929, 79 y.o.   MRN: [id number]  HPI Patient seen the following recent right total hip replacement He had complication of some severe constipation following surgery after starting opioids Patient went to rehabilitation center and had issues with weight loss and loss of appetite. Was started on Remeron and has had some weight gain since then. Sleeping well. Still has occasional pain right mid thigh but overall improving.  Patient had some obstructive urinary issues following surgery. Start Flomax 0.4 mg each bedtime. No obstructive symptoms at this time.  Hypertension stable on losartan HCTZ and amlodipine. Denies any dizziness. No chest pains. Medications reviewed. Needs several refills today.  Hypothyroidism treated with levofloxacin. Recent TSH at goal.  Past Medical History  Diagnosis Date  . Hypertension   . Chronic kidney disease     kidney stones   . Esophageal cancer 1990  . Hypothyroidism   . Inguinal hernia February 2013    right  . Esophageal stricture   . Food impaction of esophagus   . Barrett esophagus   . Gastroparesis   . Aortic aneurysm, abdominal     repair  . Arthritis    Past Surgical History  Procedure Laterality Date  . Sp endo repair infraren aaa  8/09    es  . Coarctation of aorta repair  2009  . Inguinal hernia repair  09/01/11    bilateral  . Esophagogastroduodenoscopy  10/05/2011    Procedure: ESOPHAGOGASTRODUODENOSCOPY (EGD);  Surgeon: Meryl Dare, MD,FACG;  Location[facility] ENDOSCOPY;  Service: Endoscopy;  Laterality: N/A;  . Vagotomy    . Esophagectomy      partial  . Umbilical hernia repair    . Total hip arthroplasty Right 08/06/2012    Procedure: RIGHT TOTAL HIP ARTHOPLASTY WITH ACETABULAR AUTOGRAFT;  Surgeon: Loanne Drilling, MD;  Location: [facility] ORS;  Service: Orthopedics;  Laterality: Right;  RIGHT TOTAL HIP ARTHOPLASTY WITH ACETABULAR AUTOGRAFT     reports that he  quit smoking about 41 years ago. His smoking use included Cigarettes. He has a 11 pack-year smoking history. He has never used smokeless tobacco. He reports that he does not drink alcohol or use illicit drugs. family history includes Alzheimer's disease in his sister; Aneurysm in his daughter; Glaucoma in his sister; and Pancreatic cancer in his father. No Known Allergies    Review of Systems  Constitutional: Negative for fatigue.  Eyes: Negative for visual disturbance.  Respiratory: Negative for cough, chest tightness and shortness of breath.   Cardiovascular: Negative for chest pain, palpitations and leg swelling.  Genitourinary: Negative for dysuria and difficulty urinating.  Neurological: Negative for dizziness, syncope, weakness, light-headedness and headaches.       Objective:   Physical Exam  Constitutional: He appears well-developed and well-nourished.  Neck: Neck supple. No thyromegaly present.  Cardiovascular: Normal rate and regular rhythm.   Pulmonary/Chest: Effort normal and breath sounds normal. No respiratory distress. He has no wheezes. He has no rales.  Musculoskeletal: He exhibits no edema.          Assessment & Plan:  #1 weight loss following surgery. Improved with Remeron. Refills given. We'll plan to maintain over in and there's the next few months and reassess in 3-4 months and consider discontinuing that weight is stable #2 hypertension. Controlled. Refill amlodipine for one year #3 BPH. Improved with Flomax. Refills given. #4 hypothyroidism. Recheck TSH at followup

## 2012-10-04 MED ORDER — MIRTAZAPINE 15 MG PO TABS: 15.0000 mg | ORAL_TABLET | Freq: Every day | ORAL | Status: DC

## 2012-10-04 MED ORDER — AMLODIPINE BESYLATE 5 MG PO TABS: 5.0000 mg | ORAL_TABLET | Freq: Every evening | ORAL | Status: DC

## 2012-10-04 MED ORDER — TAMSULOSIN HCL 0.4 MG PO CAPS: 0.4000 mg | ORAL_CAPSULE | Freq: Every day | ORAL | Status: DC

## 2012-10-04 NOTE — Addendum Note (Signed)
Addended by: Melchor Amour on: 10/04/2012 05:12 PM   Modules accepted: Orders

## 2012-10-28 ENCOUNTER — Other Ambulatory Visit: Payer: Self-pay | Admitting: Family Medicine

## 2012-11-27 ENCOUNTER — Other Ambulatory Visit: Payer: Self-pay | Admitting: Family Medicine

## 2012-12-11 ENCOUNTER — Other Ambulatory Visit: Payer: Self-pay | Admitting: Family Medicine

## 2012-12-13 NOTE — Telephone Encounter (Signed)
Rx request to pharmacy/SLS  

## 2013-01-26 ENCOUNTER — Other Ambulatory Visit: Payer: Self-pay

## 2013-01-31 ENCOUNTER — Encounter: Payer: Self-pay | Admitting: Family Medicine

## 2013-01-31 ENCOUNTER — Ambulatory Visit (INDEPENDENT_AMBULATORY_CARE_PROVIDER_SITE_OTHER): Payer: Medicare Other | Admitting: Family Medicine

## 2013-01-31 VITALS — BP 130/60 | HR 60 | Temp 98.6°F | Wt 134.0 lb

## 2013-01-31 DIAGNOSIS — E785 Hyperlipidemia, unspecified: Secondary | ICD-10-CM

## 2013-01-31 DIAGNOSIS — E039 Hypothyroidism, unspecified: Secondary | ICD-10-CM

## 2013-01-31 DIAGNOSIS — I10 Essential (primary) hypertension: Secondary | ICD-10-CM

## 2013-01-31 NOTE — Progress Notes (Signed)
Subjective:    Patient ID: Isaiah Graham, male    DOB: 18-Aug-1929, 79 y.o.   MRN: [id number]  HPI Medical follow up Chronic problems include history of esophageal cancer, GERD, recurrent esophageal stricture, osteoarthritis, hypothyroidism, hypertension, and BPH. Medications reviewed and compliant with all.  His blood pressures been well controlled. No recent dizziness. No chest pains. No peripheral edema. Denies any headaches.  He had substantial weight loss following hip replacement surgery. We started Remeron. His appetite has improved. He has gained 8 pounds since last visit. His hip is doing well overall.  Hypothyroidism treated with levothyroxin. Needs followup labs. BPH treated with Flomax. No recent obstructive symptoms.  Past Medical History  Diagnosis Date  . Hypertension   . Chronic kidney disease     kidney stones   . Esophageal cancer 1990  . Hypothyroidism   . Inguinal hernia February 2013    right  . Esophageal stricture   . Food impaction of esophagus   . Barrett esophagus   . Gastroparesis   . Aortic aneurysm, abdominal     repair  . Arthritis    Past Surgical History  Procedure Laterality Date  . Sp endo repair infraren aaa  8/09    es  . Coarctation of aorta repair  2009  . Inguinal hernia repair  09/01/11    bilateral  . Esophagogastroduodenoscopy  10/05/2011    Procedure: ESOPHAGOGASTRODUODENOSCOPY (EGD);  Surgeon: Meryl Dare, MD,FACG;  Location: [facility] ENDOSCOPY;  Service: Endoscopy;  Laterality: N/A;  . Vagotomy    . Esophagectomy      partial  . Umbilical hernia repair    . Total hip arthroplasty Right 08/06/2012    Procedure: RIGHT TOTAL HIP ARTHOPLASTY WITH ACETABULAR AUTOGRAFT;  Surgeon: Loanne Drilling, MD;  Location: [facility];  Service: Orthopedics;  Laterality: Right;  RIGHT TOTAL HIP ARTHOPLASTY WITH ACETABULAR AUTOGRAFT     reports that he quit smoking about 42 years ago. His smoking use included Cigarettes. He has a 11 pack-year  smoking history. He has never used smokeless tobacco. He reports that he does not drink alcohol or use illicit drugs. family history includes Alzheimer's disease in his sister; Aneurysm in his daughter; Glaucoma in his sister; and Pancreatic cancer in his father. No Known Allergies    Review of Systems  Constitutional: Negative for appetite change, fatigue and unexpected weight change.  HENT: Negative for trouble swallowing.   Eyes: Negative for visual disturbance.  Respiratory: Negative for cough, chest tightness and shortness of breath.   Cardiovascular: Negative for chest pain, palpitations and leg swelling.  Gastrointestinal: Negative for abdominal pain.  Endocrine: Negative for polydipsia and polyuria.  Neurological: Negative for dizziness, syncope, weakness, light-headedness and headaches.       Objective:   Physical Exam  Constitutional: He is oriented to person, place, and time. He appears well-developed and well-nourished.  Neck: Neck supple.  Cardiovascular: Normal rate and regular rhythm.   Pulmonary/Chest: Effort normal and breath sounds normal. No respiratory distress. He has no wheezes. He has no rales.  Abdominal: Soft. Bowel sounds are normal. He exhibits no distension and no mass. There is no tenderness. There is no rebound and no guarding.  Musculoskeletal: He exhibits no edema.  Neurological: He is alert and oriented to person, place, and time.  Psychiatric: He has a normal mood and affect. His behavior is normal.          Assessment & Plan:  #1 hypertension. Well controlled. Continue current medications #2  hypothyroidism. Recheck TSH #3 history of mild hyperlipidemia. Check lipid and hepatic panel #4 history of GERD which is currently well controlled with omeprazole. #5 weight loss following surgery above. Improved on Remeron. We elected to continue this medication which he is tolerating without side effects

## 2013-02-01 LAB — LIPID PANEL
Cholesterol: 196 mg/dL (ref 0–200)
VLDL: 18.8 mg/dL (ref 0.0–40.0)

## 2013-02-01 LAB — HEPATIC FUNCTION PANEL
ALT: 12 U/L (ref 0–53)
AST: 22 U/L (ref 0–37)
Bilirubin, Direct: 0.1 mg/dL (ref 0.0–0.3)
Total Bilirubin: 0.9 mg/dL (ref 0.3–1.2)

## 2013-02-01 LAB — BASIC METABOLIC PANEL
BUN: 27 mg/dL — ABNORMAL HIGH (ref 6–23)
Creatinine, Ser: 1.5 mg/dL (ref 0.4–1.5)
GFR: 46.99 mL/min — ABNORMAL LOW (ref >60.00)
Glucose, Bld: 89 mg/dL (ref 70–99)
Potassium: 4.4 mEq/L (ref 3.5–5.1)

## 2013-02-01 LAB — TSH: TSH: 4.98 u[IU]/mL (ref 0.35–5.50)

## 2013-04-28 ENCOUNTER — Other Ambulatory Visit: Payer: Self-pay

## 2013-05-06 ENCOUNTER — Other Ambulatory Visit: Payer: Self-pay | Admitting: Family Medicine

## 2013-05-16 ENCOUNTER — Telehealth: Payer: Self-pay | Admitting: Family Medicine

## 2013-05-16 MED ORDER — LOSARTAN POTASSIUM-HCTZ 100-12.5 MG PO TABS: 1.0000 | ORAL_TABLET | Freq: Every day | ORAL | Status: DC

## 2013-05-16 NOTE — Telephone Encounter (Signed)
Pt needs refill on  losartan-hctz # [facility]  [facility] losartan-h[facility]z #30. Pt will be waiting on mailorder

## 2013-05-16 NOTE — Telephone Encounter (Signed)
[facility]

## 2013-07-09 ENCOUNTER — Other Ambulatory Visit: Payer: Self-pay | Admitting: Family Medicine

## 2013-07-13 ENCOUNTER — Other Ambulatory Visit: Payer: Self-pay

## 2013-07-13 ENCOUNTER — Ambulatory Visit (INDEPENDENT_AMBULATORY_CARE_PROVIDER_SITE_OTHER): Payer: Medicare Other | Admitting: Family Medicine

## 2013-07-13 ENCOUNTER — Encounter: Payer: Self-pay | Admitting: Family Medicine

## 2013-07-13 VITALS — BP 128/70 | HR 60 | Temp 97.6°F | Wt 142.0 lb

## 2013-07-13 DIAGNOSIS — K219 Gastro-esophageal reflux disease without esophagitis: Secondary | ICD-10-CM

## 2013-07-13 DIAGNOSIS — E039 Hypothyroidism, unspecified: Secondary | ICD-10-CM

## 2013-07-13 DIAGNOSIS — I10 Essential (primary) hypertension: Secondary | ICD-10-CM

## 2013-07-13 MED ORDER — AMLODIPINE BESYLATE 5 MG PO TABS
5.0000 mg | ORAL_TABLET | Freq: Every evening | ORAL | Status: DC
Start: 2013-07-13 — End: 2014-06-02

## 2013-07-13 MED ORDER — OMEPRAZOLE 20 MG PO CPDR: DELAYED_RELEASE_CAPSULE | ORAL | Status: DC

## 2013-07-13 MED ORDER — LOSARTAN POTASSIUM-HCTZ 100-12.5 MG PO TABS: 1.0000 | ORAL_TABLET | Freq: Every day | ORAL | Status: DC

## 2013-07-13 MED ORDER — TAMSULOSIN HCL 0.4 MG PO CAPS: 0.4000 mg | ORAL_CAPSULE | Freq: Every day | ORAL | Status: DC

## 2013-07-13 MED ORDER — AMLODIPINE BESYLATE 5 MG PO TABS: 5.0000 mg | ORAL_TABLET | Freq: Every evening | ORAL | Status: DC

## 2013-07-13 MED ORDER — LEVOTHYROXINE SODIUM 75 MCG PO TABS: ORAL_TABLET | ORAL | Status: DC

## 2013-07-13 MED ORDER — MIRTAZAPINE 15 MG PO TABS: ORAL_TABLET | ORAL | Status: DC

## 2013-07-13 NOTE — Progress Notes (Signed)
Pre visit review using our clinic review tool, if applicable. No additional management support is needed unless otherwise documented below in the visit note. 

## 2013-07-13 NOTE — Progress Notes (Signed)
   Subjective:    Patient ID: Isaiah Graham, male    DOB: 05-12-1930, 80 y.o.   MRN: [id number]  HPI  here for medical followup. Has remote history of esophageal cancer many years ago, history of GERD, esophageal stricture, hypertension, BPH, osteoarthritis, and hypothyroidism. Total hip replacement a year ago and has done well since then. Some weight loss following surgery but gained about 8 pounds since last visit. He is on Remeron which is helping sleep and appetite. Overall feels well. No recent falls. Compliant with all medications. No dizziness. Denies any recent dysphagia. Appetite is improved. No chest pains. No dizziness.  Past Medical History  Diagnosis Date  . Hypertension   . Chronic kidney disease     kidney stones   . Esophageal cancer 1990  . Hypothyroidism   . Inguinal hernia February 2013    right  . Esophageal stricture   . Food impaction of esophagus   . Barrett esophagus   . Gastroparesis   . Aortic aneurysm, abdominal     repair  . Arthritis    Past Surgical History  Procedure Laterality Date  . Sp endo repair infraren aaa  8/09    es  . Coarctation of aorta repair  2009  . Inguinal hernia repair  09/01/11    bilateral  . Esophagogastroduodenoscopy  10/05/2011    Procedure: ESOPHAGOGASTRODUODENOSCOPY (EGD);  Surgeon: Ladene Artist, MD,FACG;  Location: [facility] ENDOSCOPY;  Service: Endoscopy;  Laterality: N/A;  . Vagotomy    . Esophagectomy      partial  . Umbilical hernia repair    . Total hip arthroplasty Right 08/06/2012    Procedure: RIGHT TOTAL HIP ARTHOPLASTY WITH ACETABULAR AUTOGRAFT;  Surgeon: Gearlean Alf, MD;  Location: [facility];  Service: Orthopedics;  Laterality: Right;  RIGHT TOTAL HIP ARTHOPLASTY WITH ACETABULAR AUTOGRAFT     reports that he quit smoking about 42 years ago. His smoking use included Cigarettes. He has a 11 pack-year smoking history. He has never used smokeless tobacco. He reports that he does not drink alcohol or use illicit  drugs. family history includes Alzheimer's disease in his sister; Aneurysm in his daughter; Glaucoma in his sister; Pancreatic cancer in his father. No Known Allergies    Review of Systems  Constitutional: Negative for fever, appetite change, fatigue and unexpected weight change.  HENT: Negative for trouble swallowing.   Respiratory: Negative for cough and shortness of breath.   Cardiovascular: Negative for chest pain, palpitations and leg swelling.  Gastrointestinal: Negative for abdominal pain.  Neurological: Negative for dizziness and weakness.       Objective:   Physical Exam  Constitutional: He appears well-developed and well-nourished.  Neck: Neck supple. No JVD present.  Cardiovascular: Normal rate and regular rhythm.   Murmur heard. He has fairly prominent murmur aortic area  Pulmonary/Chest: Effort normal and breath sounds normal. No respiratory distress. He has no wheezes. He has no rales.  Musculoskeletal: He exhibits no edema.          Assessment & Plan:  #1 hypertension. Well controlled. Continue current medications #2 hypothyroidism. Recent TSH at goal. Repeat TSH at followup #3 BPH symptomatically stable. Refill Flomax for one year #4 history of GERD and esophageal stricture. Continue omeprazole. Has remote history of esophageal cancer many years ago #5 osteoarthritis involving multiple joints. Currently ambulating well following his hip replacement surgery

## 2013-07-14 ENCOUNTER — Telehealth: Payer: Self-pay | Admitting: Family Medicine

## 2013-07-14 NOTE — Telephone Encounter (Signed)
Relevant patient education assigned to patient using Emmi. ° °

## 2013-09-11 ENCOUNTER — Other Ambulatory Visit: Payer: Self-pay | Admitting: Family Medicine

## 2014-04-07 ENCOUNTER — Other Ambulatory Visit: Payer: Self-pay

## 2014-05-14 ENCOUNTER — Other Ambulatory Visit: Payer: Self-pay | Admitting: Family Medicine

## 2014-06-02 ENCOUNTER — Other Ambulatory Visit: Payer: Self-pay | Admitting: Family Medicine

## 2014-06-03 ENCOUNTER — Other Ambulatory Visit: Payer: Self-pay | Admitting: Family Medicine

## 2014-06-29 ENCOUNTER — Other Ambulatory Visit: Payer: Self-pay | Admitting: Family Medicine

## 2014-07-03 ENCOUNTER — Other Ambulatory Visit: Payer: Self-pay | Admitting: Family Medicine

## 2014-07-06 ENCOUNTER — Ambulatory Visit (INDEPENDENT_AMBULATORY_CARE_PROVIDER_SITE_OTHER): Payer: Medicare Other | Admitting: Family Medicine

## 2014-07-06 ENCOUNTER — Encounter: Payer: Self-pay | Admitting: Family Medicine

## 2014-07-06 VITALS — BP 124/70 | HR 60 | Temp 98.0°F | Wt 132.0 lb

## 2014-07-06 DIAGNOSIS — N400 Enlarged prostate without lower urinary tract symptoms: Secondary | ICD-10-CM

## 2014-07-06 DIAGNOSIS — K219 Gastro-esophageal reflux disease without esophagitis: Secondary | ICD-10-CM

## 2014-07-06 DIAGNOSIS — I10 Essential (primary) hypertension: Secondary | ICD-10-CM

## 2014-07-06 DIAGNOSIS — Z23 Encounter for immunization: Secondary | ICD-10-CM

## 2014-07-06 DIAGNOSIS — E038 Other specified hypothyroidism: Secondary | ICD-10-CM

## 2014-07-06 LAB — BASIC METABOLIC PANEL
BUN: 34 mg/dL — AB (ref 6–23)
CALCIUM: 10.3 mg/dL (ref 8.4–10.5)
CO2: 22 meq/L (ref 19–32)
CREATININE: 1.92 mg/dL — AB (ref 0.40–1.50)
Chloride: 111 mEq/L (ref 96–112)
GFR: 35.76 mL/min — ABNORMAL LOW (ref >60.00)
Glucose, Bld: 91 mg/dL (ref 70–99)
Potassium: 4.6 mEq/L (ref 3.5–5.1)
Sodium: 139 mEq/L (ref 135–145)

## 2014-07-06 LAB — TSH: TSH: 3.12 u[IU]/mL (ref 0.35–4.50)

## 2014-07-06 MED ORDER — TAMSULOSIN HCL 0.4 MG PO CAPS: ORAL_CAPSULE | ORAL | Status: DC

## 2014-07-06 MED ORDER — LOSARTAN POTASSIUM-HCTZ 100-12.5 MG PO TABS: 1.0000 | ORAL_TABLET | Freq: Every day | ORAL | Status: DC

## 2014-07-06 MED ORDER — OMEPRAZOLE 20 MG PO CPDR: 20.0000 mg | DELAYED_RELEASE_CAPSULE | Freq: Every day | ORAL | Status: DC

## 2014-07-06 MED ORDER — AMLODIPINE BESYLATE 5 MG PO TABS: 5.0000 mg | ORAL_TABLET | Freq: Every evening | ORAL | Status: DC

## 2014-07-06 MED ORDER — LEVOTHYROXINE SODIUM 75 MCG PO TABS: ORAL_TABLET | ORAL | Status: DC

## 2014-07-06 MED ORDER — MIRTAZAPINE 15 MG PO TABS: 15.0000 mg | ORAL_TABLET | Freq: Every day | ORAL | Status: DC

## 2014-07-06 MED ORDER — DOCUSATE SODIUM 100 MG PO CAPS: 100.0000 mg | ORAL_CAPSULE | Freq: Two times a day (BID) | ORAL | Status: DC

## 2014-07-06 NOTE — Progress Notes (Signed)
   Subjective:    Patient ID: Isaiah Graham, male    DOB: 09-28-1929, 81 y.o.   MRN: [id number]  HPI Patient here for medical follow-up. He has chronic problems including hypertension, BPH, remote history of esophageal cancer, history of esophageal stricture, GERD, hypothyroidism, osteoarthritis medications reviewed. Compliant with all. Blood pressure stable. No dizziness. No chest pains. Denies any recent dysphagia. Urinary symptoms are stable. No obstructive symptoms. Immunizations reviewed. Needs Prevnar 13. Overall appetite and weight are stable.  Past Medical History  Diagnosis Date  . Hypertension   . Chronic kidney disease     kidney stones   . Esophageal cancer 1990  . Hypothyroidism   . Inguinal hernia February 2013    right  . Esophageal stricture   . Food impaction of esophagus   . Barrett esophagus   . Gastroparesis   . Aortic aneurysm, abdominal     repair  . Arthritis    Past Surgical History  Procedure Laterality Date  . Sp endo repair infraren aaa  8/09    es  . Coarctation of aorta repair  2009  . Inguinal hernia repair  09/01/11    bilateral  . Esophagogastroduodenoscopy  10/05/2011    Procedure: ESOPHAGOGASTRODUODENOSCOPY (EGD);  Surgeon: Ladene Artist, MD,FACG;  Location: [facility] ENDOSCOPY;  Service: Endoscopy;  Laterality: N/A;  . Vagotomy    . Esophagectomy      partial  . Umbilical hernia repair    . Total hip arthroplasty Right 08/06/2012    Procedure: RIGHT TOTAL HIP ARTHOPLASTY WITH ACETABULAR AUTOGRAFT;  Surgeon: Gearlean Alf, MD;  Location: [facility];  Service: Orthopedics;  Laterality: Right;  RIGHT TOTAL HIP ARTHOPLASTY WITH ACETABULAR AUTOGRAFT     reports that he quit smoking about 43 years ago. His smoking use included Cigarettes. He has a 11 pack-year smoking history. He has never used smokeless tobacco. He reports that he does not drink alcohol or use illicit drugs. family history includes Alzheimer's disease in his sister; Aneurysm in his  daughter; Glaucoma in his sister; Pancreatic cancer in his father. No Known Allergies    Review of Systems  Constitutional: Negative for fatigue.  Eyes: Negative for visual disturbance.  Respiratory: Negative for cough, chest tightness and shortness of breath.   Cardiovascular: Negative for chest pain, palpitations and leg swelling.  Neurological: Negative for dizziness, syncope, weakness, light-headedness and headaches.       Objective:   Physical Exam  Constitutional: He is oriented to person, place, and time. He appears well-developed and well-nourished.  HENT:  Right Ear: External ear normal.  Left Ear: External ear normal.  Mouth/Throat: Oropharynx is clear and moist.  Eyes: Pupils are equal, round, and reactive to light.  Neck: Neck supple. No thyromegaly present.  Cardiovascular: Normal rate and regular rhythm.   Pulmonary/Chest: Effort normal and breath sounds normal. No respiratory distress. He has no wheezes. He has no rales.  Musculoskeletal: He exhibits no edema.  Neurological: He is alert and oriented to person, place, and time.          Assessment & Plan:  #1 hypertension. Stable and at goal. Continue current medications. Refills given. Check basic metabolic panel #2 hypothyroidism. Check TSH. Refill levothyroxin for one year #3 history of GERD. Symptomatically stable. Refilled omeprazole  #4 BPH. Stable. Refill Flomax for one year #5 health maintenance. Prevnar 13 given

## 2014-07-07 ENCOUNTER — Other Ambulatory Visit: Payer: Self-pay | Admitting: Family Medicine

## 2014-07-07 DIAGNOSIS — R7989 Other specified abnormal findings of blood chemistry: Secondary | ICD-10-CM

## 2014-07-27 ENCOUNTER — Other Ambulatory Visit (INDEPENDENT_AMBULATORY_CARE_PROVIDER_SITE_OTHER): Payer: Medicare Other

## 2014-07-27 DIAGNOSIS — R748 Abnormal levels of other serum enzymes: Secondary | ICD-10-CM

## 2014-07-27 DIAGNOSIS — R7989 Other specified abnormal findings of blood chemistry: Secondary | ICD-10-CM

## 2014-07-27 LAB — BASIC METABOLIC PANEL
BUN: 38 mg/dL — AB (ref 6–23)
CHLORIDE: 111 meq/L (ref 96–112)
CO2: 22 mEq/L (ref 19–32)
Calcium: 9.9 mg/dL (ref 8.4–10.5)
Creatinine, Ser: 1.81 mg/dL — ABNORMAL HIGH (ref 0.40–1.50)
GFR: 38.27 mL/min — ABNORMAL LOW (ref >60.00)
GLUCOSE: 87 mg/dL (ref 70–99)
Potassium: 4.6 mEq/L (ref 3.5–5.1)
Sodium: 137 mEq/L (ref 135–145)

## 2014-07-28 ENCOUNTER — Other Ambulatory Visit: Payer: Self-pay | Admitting: Family Medicine

## 2014-07-28 DIAGNOSIS — I10 Essential (primary) hypertension: Secondary | ICD-10-CM

## 2014-10-09 ENCOUNTER — Other Ambulatory Visit: Payer: TRICARE For Life (TFL)

## 2014-10-10 ENCOUNTER — Other Ambulatory Visit (INDEPENDENT_AMBULATORY_CARE_PROVIDER_SITE_OTHER): Payer: Medicare Other

## 2014-10-10 DIAGNOSIS — I10 Essential (primary) hypertension: Secondary | ICD-10-CM | POA: Diagnosis not present

## 2014-10-10 LAB — BASIC METABOLIC PANEL WITH GFR
BUN: 36 mg/dL — ABNORMAL HIGH (ref 6–23)
CO2: 23 meq/L (ref 19–32)
Calcium: 10.1 mg/dL (ref 8.4–10.5)
Chloride: 112 meq/L (ref 96–112)
Creatinine, Ser: 2.34 mg/dL — ABNORMAL HIGH (ref 0.40–1.50)
GFR: 28.44 mL/min — ABNORMAL LOW
Glucose, Bld: 95 mg/dL (ref 70–99)
Potassium: 4.7 meq/L (ref 3.5–5.1)
Sodium: 139 meq/L (ref 135–145)

## 2014-11-01 ENCOUNTER — Encounter: Payer: Self-pay | Admitting: Family Medicine

## 2014-11-01 ENCOUNTER — Ambulatory Visit (INDEPENDENT_AMBULATORY_CARE_PROVIDER_SITE_OTHER): Payer: Medicare Other | Admitting: Family Medicine

## 2014-11-01 VITALS — BP 120/66 | HR 70 | Temp 98.1°F | Wt 129.0 lb

## 2014-11-01 DIAGNOSIS — N184 Chronic kidney disease, stage 4 (severe): Secondary | ICD-10-CM | POA: Diagnosis not present

## 2014-11-01 DIAGNOSIS — I10 Essential (primary) hypertension: Secondary | ICD-10-CM

## 2014-11-01 MED ORDER — HYDROCHLOROTHIAZIDE 12.5 MG PO CAPS: 12.5000 mg | ORAL_CAPSULE | Freq: Every day | ORAL | Status: DC

## 2014-11-01 NOTE — Patient Instructions (Signed)
Stop the Losartan HCTZ Increase Amlodipine 5 mg to TWO at night (10 mg) Start the HCTZ 12.5 mg one daily Stay well hydrated

## 2014-11-01 NOTE — Progress Notes (Signed)
Pre visit review using our clinic review tool, if applicable. No additional management support is needed unless otherwise documented below in the visit note. 

## 2014-11-01 NOTE — Progress Notes (Signed)
   Subjective:    Patient ID: Isaiah Graham, male    DOB: 06/12/1930, 81 y.o.   MRN: [id number]  HPI Patient is here to evaluate recent increasing creatinine. His chronic problems include hypothyroidism, hypertension, GERD, remote history of esophageal cancer, osteoarthritis, BPH. He is followed by urology. He takes Flomax. No recent obstructive urinary symptoms. He had creatinine 3 months ago 1.8 and 3 weeks ago 2.34. He is currently on regimen of amlodipine 5 mg at night and losartan HCTZ 1 daily. No recent peripheral edema issues. No nonsteroidal use. Compliant with medications. He feels he is staying well-hydrated.  Past Medical History  Diagnosis Date  . Hypertension   . Chronic kidney disease     kidney stones   . Esophageal cancer 1990  . Hypothyroidism   . Inguinal hernia February 2013    right  . Esophageal stricture   . Food impaction of esophagus   . Barrett esophagus   . Gastroparesis   . Aortic aneurysm, abdominal     repair  . Arthritis    Past Surgical History  Procedure Laterality Date  . Sp endo repair infraren aaa  8/09    es  . Coarctation of aorta repair  2009  . Inguinal hernia repair  09/01/11    bilateral  . Esophagogastroduodenoscopy  10/05/2011    Procedure: ESOPHAGOGASTRODUODENOSCOPY (EGD);  Surgeon: Ladene Artist, MD,FACG;  Location: [facility] ENDOSCOPY;  Service: Endoscopy;  Laterality: N/A;  . Vagotomy    . Esophagectomy      partial  . Umbilical hernia repair    . Total hip arthroplasty Right 08/06/2012    Procedure: RIGHT TOTAL HIP ARTHOPLASTY WITH ACETABULAR AUTOGRAFT;  Surgeon: Gearlean Alf, MD;  Location: [facility];  Service: Orthopedics;  Laterality: Right;  RIGHT TOTAL HIP ARTHOPLASTY WITH ACETABULAR AUTOGRAFT     reports that he quit smoking about 43 years ago. His smoking use included Cigarettes. He has a 11 pack-year smoking history. He has never used smokeless tobacco. He reports that he does not drink alcohol or use illicit drugs. family  history includes Alzheimer's disease in his sister; Aneurysm in his daughter; Glaucoma in his sister; Pancreatic cancer in his father. No Known Allergies    Review of Systems  Constitutional: Negative for fatigue.  Eyes: Negative for visual disturbance.  Respiratory: Negative for cough, chest tightness and shortness of breath.   Cardiovascular: Negative for chest pain, palpitations and leg swelling.  Neurological: Negative for dizziness, syncope, weakness, light-headedness and headaches.       Objective:   Physical Exam  Constitutional: He is oriented to person, place, and time. He appears well-developed and well-nourished.  Neck: Neck supple. No JVD present.  Cardiovascular: Normal rate and regular rhythm.  Exam reveals no gallop.   Murmur heard. Pulmonary/Chest: Effort normal and breath sounds normal. No respiratory distress. He has no wheezes. He has no rales.  Musculoskeletal: He exhibits no edema.  Neurological: He is alert and oriented to person, place, and time.          Assessment & Plan:  #1 acute on chronic kidney disease. Recent elevating creatinine as above. Discontinue losartan. Increase amlodipine to 10 mg daily at bedtime. Continue HCTZ 12.5 mg daily. Stay well-hydrated. Reassess in 2 weeks and will check blood pressure and repeat basic metabolic panel then. Consider renal ultrasound and nephrology if not improving.  Reminder for no NSAIDS. #2 hypertension.  Stable.  Med changes as above.

## 2014-11-15 ENCOUNTER — Ambulatory Visit (INDEPENDENT_AMBULATORY_CARE_PROVIDER_SITE_OTHER): Payer: Medicare Other | Admitting: Family Medicine

## 2014-11-15 ENCOUNTER — Encounter: Payer: Self-pay | Admitting: Family Medicine

## 2014-11-15 VITALS — BP 130/70 | HR 60 | Temp 98.5°F | Wt 128.0 lb

## 2014-11-15 DIAGNOSIS — I10 Essential (primary) hypertension: Secondary | ICD-10-CM

## 2014-11-15 DIAGNOSIS — N184 Chronic kidney disease, stage 4 (severe): Secondary | ICD-10-CM | POA: Diagnosis not present

## 2014-11-15 DIAGNOSIS — N189 Chronic kidney disease, unspecified: Secondary | ICD-10-CM | POA: Diagnosis not present

## 2014-11-15 DIAGNOSIS — E038 Other specified hypothyroidism: Secondary | ICD-10-CM | POA: Diagnosis not present

## 2014-11-15 LAB — BASIC METABOLIC PANEL
BUN: 32 mg/dL — AB (ref 6–23)
CALCIUM: 10.3 mg/dL (ref 8.4–10.5)
CO2: 22 mEq/L (ref 19–32)
Chloride: 108 mEq/L (ref 96–112)
Creatinine, Ser: 2.07 mg/dL — ABNORMAL HIGH (ref 0.40–1.50)
GFR: 32.75 mL/min — ABNORMAL LOW (ref >60.00)
GLUCOSE: 106 mg/dL — AB (ref 70–99)
Potassium: 4.2 mEq/L (ref 3.5–5.1)
Sodium: 137 mEq/L (ref 135–145)

## 2014-11-15 MED ORDER — AMLODIPINE BESYLATE 10 MG PO TABS: 10.0000 mg | ORAL_TABLET | Freq: Every day | ORAL | Status: DC

## 2014-11-15 NOTE — Progress Notes (Signed)
   Subjective:    Patient ID: Isaiah Graham, male    DOB: [birth year], 81 y.o.   MRN: [id number]  HPI Patient here for follow-up hypertension and chronic kidney disease. Recent increased creatinine to 2.3. We discontinued losartan and increased his amlodipine to 10 mg and started HCTZ 12.5 mg daily. Blood pressures been stable. No dizziness. No peripheral edema or headaches. He denies any obstructive urinary symptoms. Only rare nocturia. Does have history of BPH on Flomax but that seems to be working well.  Denies any non-steroidal's. He has history of chronic kidney disease with recent worsening. Hypothyroidism on thyroid replacement. TSH was normal back in January. He's had some gradual increased fatigue over several months. No chest pains. No dyspnea.  Past Medical History  Diagnosis Date  . Hypertension   . Chronic kidney disease     kidney stones   . Esophageal cancer 1990  . Hypothyroidism   . Inguinal hernia February 2013    right  . Esophageal stricture   . Food impaction of esophagus   . Barrett esophagus   . Gastroparesis   . Aortic aneurysm, abdominal     repair  . Arthritis    Past Surgical History  Procedure Laterality Date  . Sp endo repair infraren aaa  8/09    es  . Coarctation of aorta repair  2009  . Inguinal hernia repair  09/01/11    bilateral  . Esophagogastroduodenoscopy  10/05/2011    Procedure: ESOPHAGOGASTRODUODENOSCOPY (EGD);  Surgeon: Ladene Artist, MD,FACG;  Location: [facility] ENDOSCOPY;  Service: Endoscopy;  Laterality: N/A;  . Vagotomy    . Esophagectomy      partial  . Umbilical hernia repair    . Total hip arthroplasty Right 08/06/2012    Procedure: RIGHT TOTAL HIP ARTHOPLASTY WITH ACETABULAR AUTOGRAFT;  Surgeon: Gearlean Alf, MD;  Location: [facility];  Service: Orthopedics;  Laterality: Right;  RIGHT TOTAL HIP ARTHOPLASTY WITH ACETABULAR AUTOGRAFT     reports that he quit smoking about 43 years ago. His smoking use included Cigarettes. He has a  11 pack-year smoking history. He has never used smokeless tobacco. He reports that he does not drink alcohol or use illicit drugs. family history includes Alzheimer's disease in his sister; Aneurysm in his daughter; Glaucoma in his sister; Pancreatic cancer in his father. No Known Allergies    Review of Systems  Constitutional: Positive for fatigue.  HENT: Negative for trouble swallowing.   Eyes: Negative for visual disturbance.  Respiratory: Negative for cough, chest tightness and shortness of breath.   Cardiovascular: Negative for chest pain, palpitations and leg swelling.  Neurological: Negative for dizziness, syncope, weakness, light-headedness and headaches.       Objective:   Physical Exam  Constitutional: He appears well-developed and well-nourished. No distress.  Neck: Neck supple. No JVD present.  Cardiovascular: Normal rate and regular rhythm.  Exam reveals no gallop.   Murmur heard. Pulmonary/Chest: Effort normal and breath sounds normal. No respiratory distress. He has no wheezes. He has no rales.  Musculoskeletal: He exhibits no edema.  Neurological: He is alert.          Assessment & Plan:  #1 hypertension. Stable and at goal. Continue current medications #2 chronic kidney disease. Recent acute worsening. No obstructive urinary symptoms. Recent discontinuation of losartan. Recheck basic metabolic panel today. If not improving, ultrasound and nephrology consult.  He knows to avoid non-steroidal medications #3 hypothyroidism. TSH normal range back in January. Continue levothyroxin

## 2014-11-15 NOTE — Progress Notes (Signed)
Pre visit review using our clinic review tool, if applicable. No additional management support is needed unless otherwise documented below in the visit note. 

## 2014-11-16 ENCOUNTER — Other Ambulatory Visit: Payer: Self-pay | Admitting: Family Medicine

## 2014-11-16 DIAGNOSIS — R7989 Other specified abnormal findings of blood chemistry: Secondary | ICD-10-CM

## 2014-11-16 DIAGNOSIS — N189 Chronic kidney disease, unspecified: Secondary | ICD-10-CM

## 2014-11-21 ENCOUNTER — Ambulatory Visit
Admission: RE | Admit: 2014-11-21 | Discharge: 2014-11-21 | Disposition: A | Discharge status: Home / Self Care | Payer: Medicare Other | Source: Ambulatory Visit | Attending: Family Medicine | Admitting: Family Medicine

## 2014-11-21 DIAGNOSIS — N189 Chronic kidney disease, unspecified: Secondary | ICD-10-CM

## 2015-01-24 ENCOUNTER — Encounter: Payer: Self-pay | Admitting: Family Medicine

## 2015-01-24 ENCOUNTER — Ambulatory Visit (INDEPENDENT_AMBULATORY_CARE_PROVIDER_SITE_OTHER): Payer: Medicare Other | Admitting: Family Medicine

## 2015-01-24 VITALS — BP 130/54 | HR 52 | Temp 98.3°F | Wt 126.0 lb

## 2015-01-24 DIAGNOSIS — I10 Essential (primary) hypertension: Secondary | ICD-10-CM | POA: Diagnosis not present

## 2015-01-24 DIAGNOSIS — D649 Anemia, unspecified: Secondary | ICD-10-CM

## 2015-01-24 DIAGNOSIS — N184 Chronic kidney disease, stage 4 (severe): Secondary | ICD-10-CM

## 2015-01-24 DIAGNOSIS — R5383 Other fatigue: Secondary | ICD-10-CM

## 2015-01-24 LAB — CBC WITH DIFFERENTIAL/PLATELET
BASOS ABS: 0 10*3/uL (ref 0.0–0.1)
BASOS PCT: 0.5 % (ref 0.0–3.0)
Eosinophils Absolute: 0.1 10*3/uL (ref 0.0–0.7)
Eosinophils Relative: 2.4 % (ref 0.0–5.0)
HCT: 35.5 % — ABNORMAL LOW (ref 39.0–52.0)
Hemoglobin: 12 g/dL — ABNORMAL LOW (ref 13.0–17.0)
LYMPHS ABS: 1.7 10*3/uL (ref 0.7–4.0)
LYMPHS PCT: 32.6 % (ref 12.0–46.0)
MCHC: 33.7 g/dL (ref 30.0–36.0)
MCV: 87.7 fl (ref 78.0–100.0)
MONO ABS: 0.3 10*3/uL (ref 0.1–1.0)
MONOS PCT: 6.7 % (ref 3.0–12.0)
NEUTROS ABS: 3 10*3/uL (ref 1.4–7.7)
Neutrophils Relative %: 57.8 % (ref 43.0–77.0)
PLATELETS: 160 10*3/uL (ref 150.0–400.0)
RBC: 4.05 Mil/uL — ABNORMAL LOW (ref 4.22–5.81)
RDW: 14.5 % (ref 11.5–15.5)
WBC: 5.2 10*3/uL (ref 4.0–10.5)

## 2015-01-24 LAB — BASIC METABOLIC PANEL
BUN: 28 mg/dL — AB (ref 6–23)
CALCIUM: 10.4 mg/dL (ref 8.4–10.5)
CHLORIDE: 107 meq/L (ref 96–112)
CO2: 26 mEq/L (ref 19–32)
Creatinine, Ser: 1.71 mg/dL — ABNORMAL HIGH (ref 0.40–1.50)
GFR: 40.81 mL/min — AB (ref >60.00)
Glucose, Bld: 102 mg/dL — ABNORMAL HIGH (ref 70–99)
POTASSIUM: 4.3 meq/L (ref 3.5–5.1)
Sodium: 139 mEq/L (ref 135–145)

## 2015-01-24 LAB — VITAMIN B12: VITAMIN B 12: 690 pg/mL (ref 211–911)

## 2015-01-24 MED ORDER — HYDROCHLOROTHIAZIDE 12.5 MG PO CAPS: 12.5000 mg | ORAL_CAPSULE | Freq: Every day | ORAL | Status: DC

## 2015-01-24 NOTE — Progress Notes (Signed)
Subjective:    Patient ID: Isaiah Graham, male    DOB: December 22, 1929, 81 y.o.   MRN: [id number]  HPI Patient seen for medical follow-up. His chronic problems include history of hypertension, hypothyroidism, GERD, remote history of esophageal cancer, history of esophageal stricture, osteoarthritis, BPH, chronic kidney disease. He had recent worsening of chronic kidney disease. We discontinued losartan and increased his amlodipine. Blood pressure stable. Only very trace leg edema which improves after elevation and sleep.  No BPH symptoms. Stable on Flomax.  He does complain of increased fatigue. He denies any chest pains or dyspnea. No dizziness. Appetite is fair. He has lost 2 pounds since last visit. Recent TSH normal. No stool changes. No abdominal pain.  Past Medical History  Diagnosis Date  . Hypertension   . Chronic kidney disease     kidney stones   . Esophageal cancer 1990  . Hypothyroidism   . Inguinal hernia February 2013    right  . Esophageal stricture   . Food impaction of esophagus   . Barrett esophagus   . Gastroparesis   . Aortic aneurysm, abdominal     repair  . Arthritis    Past Surgical History  Procedure Laterality Date  . Sp endo repair infraren aaa  8/09    es  . Coarctation of aorta repair  2009  . Inguinal hernia repair  09/01/11    bilateral  . Esophagogastroduodenoscopy  10/05/2011    Procedure: ESOPHAGOGASTRODUODENOSCOPY (EGD);  Surgeon: Ladene Artist, MD,FACG;  Location: [facility] ENDOSCOPY;  Service: Endoscopy;  Laterality: N/A;  . Vagotomy    . Esophagectomy      partial  . Umbilical hernia repair    . Total hip arthroplasty Right 08/06/2012    Procedure: RIGHT TOTAL HIP ARTHOPLASTY WITH ACETABULAR AUTOGRAFT;  Surgeon: Gearlean Alf, MD;  Location: [facility];  Service: Orthopedics;  Laterality: Right;  RIGHT TOTAL HIP ARTHOPLASTY WITH ACETABULAR AUTOGRAFT     reports that he quit smoking about 44 years ago. His smoking use included Cigarettes. He  has a 11 pack-year smoking history. He has never used smokeless tobacco. He reports that he does not drink alcohol or use illicit drugs. family history includes Alzheimer's disease in his sister; Aneurysm in his daughter; Glaucoma in his sister; Pancreatic cancer in his father. No Known Allergies    Review of Systems  Constitutional: Positive for fatigue. Negative for fever and chills.  Respiratory: Negative for cough and shortness of breath.   Cardiovascular: Negative for chest pain and palpitations.  Gastrointestinal: Negative for nausea, vomiting, abdominal pain, diarrhea and constipation.  Endocrine: Negative for polydipsia and polyuria.  Genitourinary: Negative for dysuria.  Neurological: Negative for dizziness.       Objective:   Physical Exam  Constitutional: He appears well-developed and well-nourished.  Neck: Neck supple. No JVD present.  Cardiovascular: Normal rate and regular rhythm.   Pulmonary/Chest: Effort normal and breath sounds normal. No respiratory distress. He has no wheezes. He has no rales.  Abdominal: Soft. There is no tenderness.  Musculoskeletal:  Trace edema legs bilaterally  Psychiatric: He has a normal mood and affect. His behavior is normal.          Assessment & Plan:  #1 hypertension. Stable and at goal. Continue current medications #2 chronic kidney disease. Recheck basic metabolic panel. Avoid all non-steroidal. #3 fatigue. Probably multifactorial. Check CBC. Also check B12 level with chronic PPI use. He has prominent murmur aortic area but denies any dizziness or any  dyspnea with exertion #4 hypothyroidism. TSH was at goal in January. Continue levothyroxin #5 GERD symptomatically stable on omeprazole. #6 history of anemia. Probable anemia of chronic disease. Recheck CBC

## 2015-01-24 NOTE — Progress Notes (Signed)
Pre visit review using our clinic review tool, if applicable. No additional management support is needed unless otherwise documented below in the visit note. 

## 2015-01-31 IMAGING — CR DG ABDOMEN ACUTE W/ 1V CHEST
4 series · 4 of 4 positions shown · non-contrast
Comparison: 02/13/2008 and chest film of 07/22/2012

CLINICAL DATA: Trouble with bowels.  No bowel movements for 7 days.

ACUTE ABDOMEN SERIES (ABDOMEN 2 VIEW & CHEST 1 VIEW)

[w chest pa]
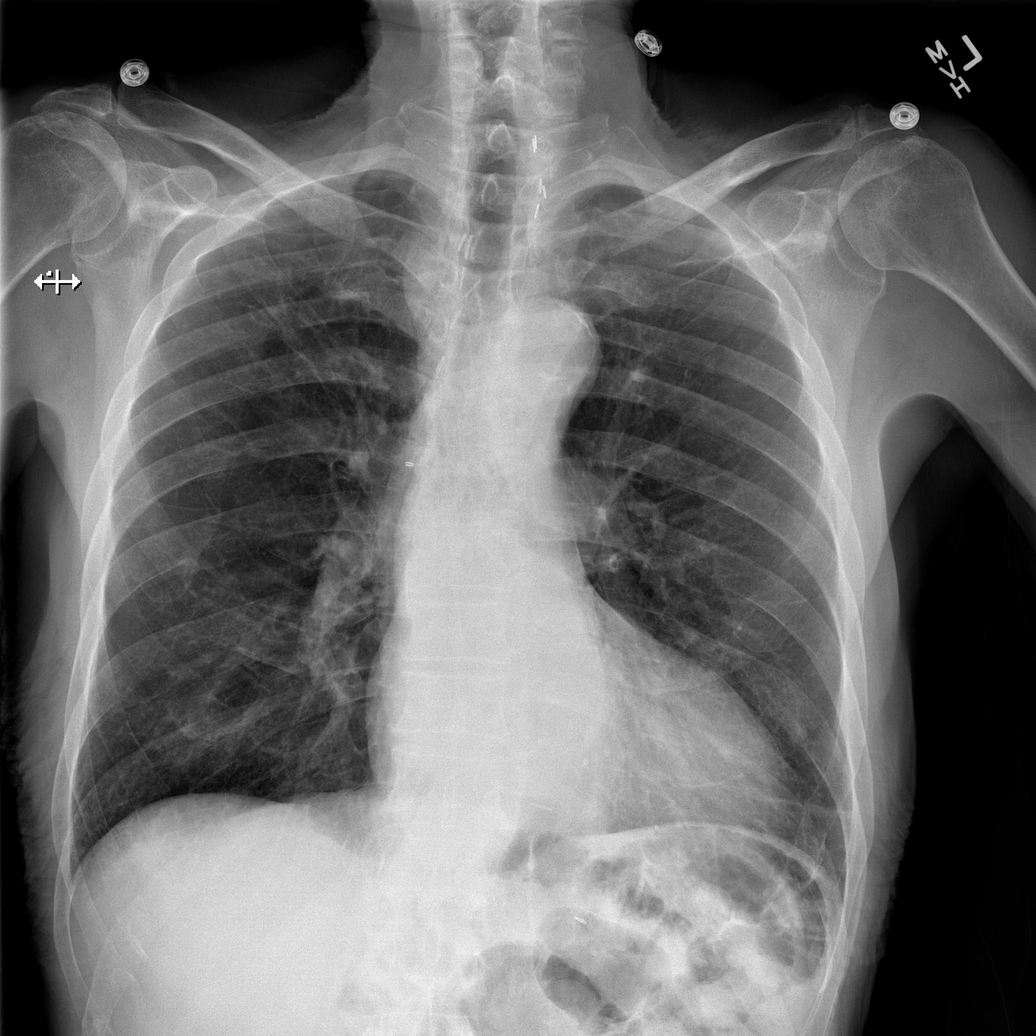

[w abdomen upright]
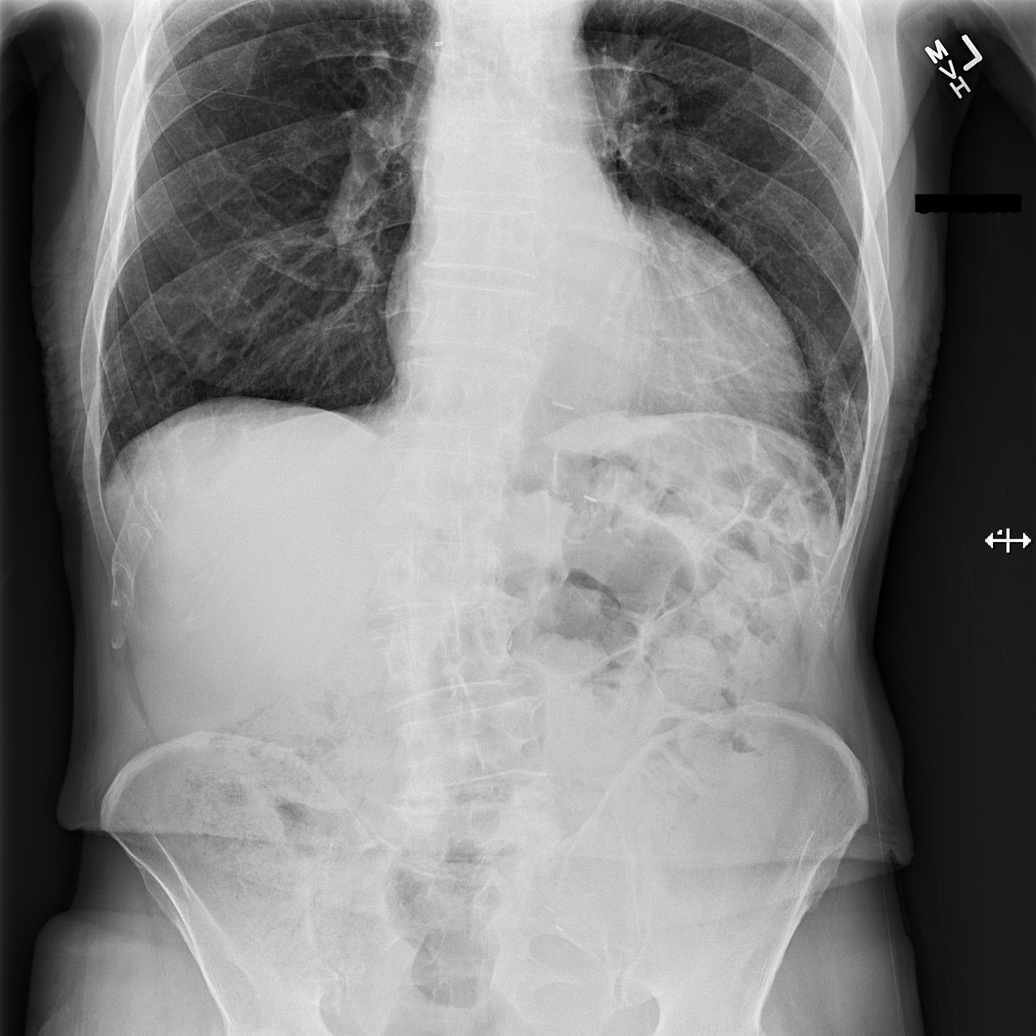

[t abdomen supine (1 of 2)]
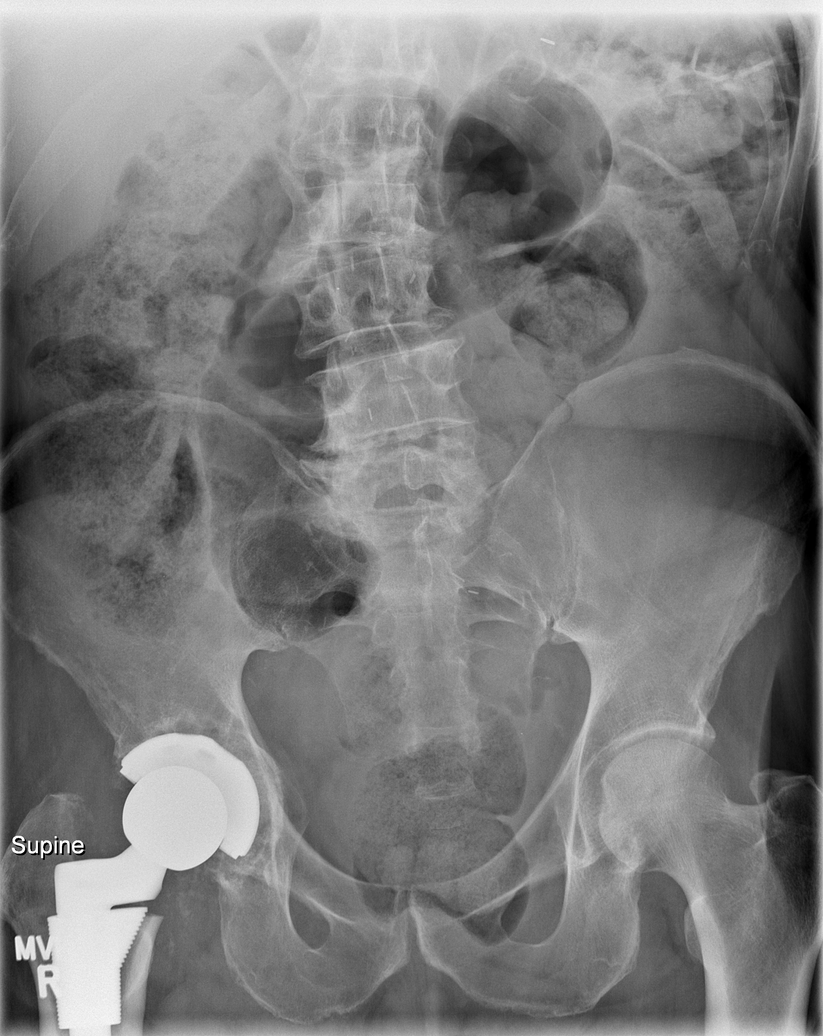

[t abdomen supine (2 of 2)]
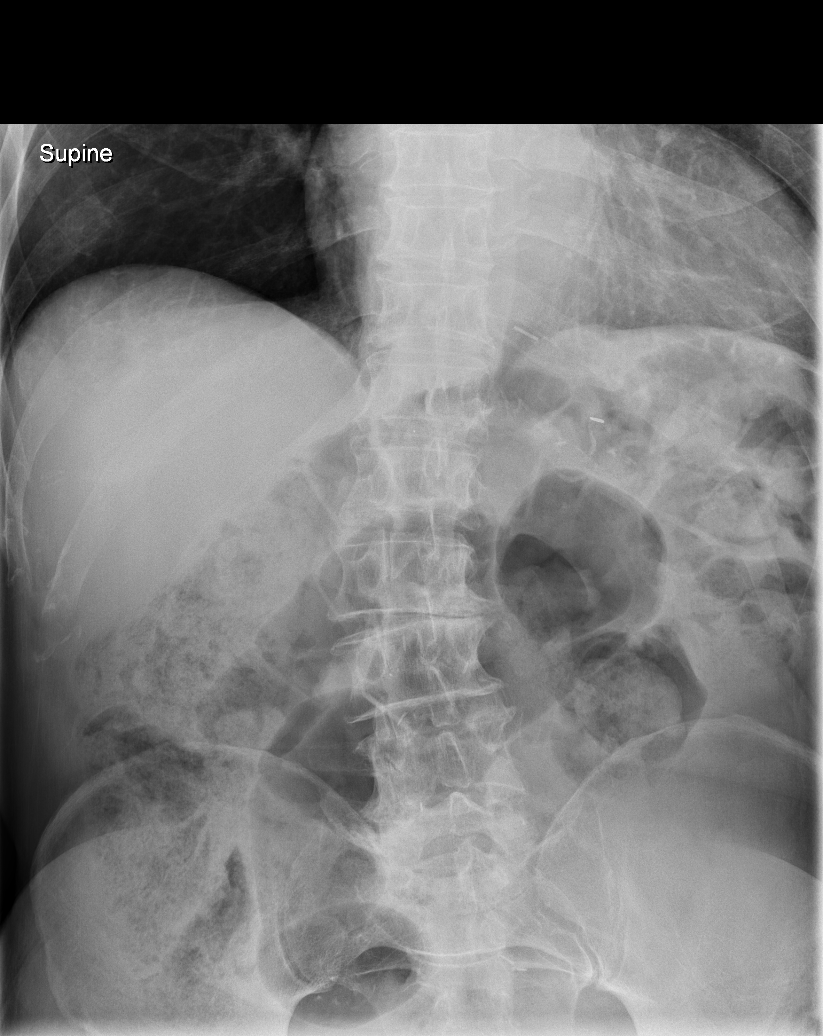

[4 of 4 positions shown; findings below may reference images not displayed]

FINDINGS: Frontal view of the chest demonstrates surgical changes
in the left paratracheal region.  Cardiomegaly with tortuous
descending thoracic aorta. No pleural effusion or pneumothorax.
Mild hyperinflation and interstitial thickening.  Possible nodular
density projecting over the right midlung, suboptimally evaluated.
Left base scarring.

Abominal films demonstrate no free intraperitoneal air on upright
positioning.  Mild S-shaped thoracolumbar spine curvature.

No small bowel distention on supine imaging.  Large amount of
colonic stool.  Rectal sigmoid stool burden is also moderate.
Right hip arthroplasty.
IMPRESSION: Possible constipation.

COPD/chronic bronchitis.  Possible right midlung nodular density of
8 mm.  Favor a granuloma or summation of ribs.  Consider plain film
follow-up at 6 months.

## 2015-04-16 ENCOUNTER — Telehealth: Payer: Self-pay | Admitting: Family Medicine

## 2015-04-16 NOTE — Telephone Encounter (Signed)
Patient's daughter said her father expressed that he did not get a B12 injection on 01/24/15, and he was billed for it.  They would like that charge taken off of his record.

## 2015-04-17 NOTE — Telephone Encounter (Signed)
Called the daughter and left a msg to call the office so we can explain about the B12 injection misunderstanding.

## 2015-04-17 NOTE — Telephone Encounter (Signed)
That is correct, patient had a B12 lab drawn that day b/c he expressed some issues with fatigue. Medicare did not cover the lab and [employer] won't cover anything that Medicare does not pay for. They will need to contact billing at [phone] with additional questions.

## 2015-04-17 NOTE — Telephone Encounter (Signed)
Patient's daughter called back and I explained about the B12 lab draw.  She said she understood and she would convey that to her father.

## 2015-05-23 ENCOUNTER — Encounter: Payer: Self-pay | Admitting: Gastroenterology

## 2015-05-28 ENCOUNTER — Ambulatory Visit (INDEPENDENT_AMBULATORY_CARE_PROVIDER_SITE_OTHER): Payer: Medicare Other | Admitting: Family Medicine

## 2015-05-28 DIAGNOSIS — E038 Other specified hypothyroidism: Secondary | ICD-10-CM | POA: Diagnosis not present

## 2015-05-28 DIAGNOSIS — N184 Chronic kidney disease, stage 4 (severe): Secondary | ICD-10-CM | POA: Diagnosis not present

## 2015-05-28 DIAGNOSIS — I10 Essential (primary) hypertension: Secondary | ICD-10-CM | POA: Diagnosis not present

## 2015-05-28 LAB — BASIC METABOLIC PANEL
BUN: 30 mg/dL — AB (ref 6–23)
CHLORIDE: 109 meq/L (ref 96–112)
CO2: 24 meq/L (ref 19–32)
CREATININE: 2.04 mg/dL — AB (ref 0.40–1.50)
Calcium: 10.4 mg/dL (ref 8.4–10.5)
GFR: 33.27 mL/min — ABNORMAL LOW (ref >60.00)
GLUCOSE: 110 mg/dL — AB (ref 70–99)
POTASSIUM: 4.5 meq/L (ref 3.5–5.1)
Sodium: 141 mEq/L (ref 135–145)

## 2015-05-28 LAB — TSH: TSH: 4.75 u[IU]/mL — ABNORMAL HIGH (ref 0.35–4.50)

## 2015-05-28 NOTE — Progress Notes (Signed)
Subjective:    Patient ID: Isaiah Graham, male    DOB: July 07, 1929, 81 y.o.   MRN: [id number]  HPI Patient seen for follow-up regarding multiple medical issues  Hypertension. Currently treated with amlodipine and HCTZ. He had been on angiotensin receptor blocker but had some problems with worsening renal failure. He does not use any nonsteroidals. Kidney function slowly been improving. No BPH symptoms. Does take Flomax and those symptoms have been stable. Usually only gets up about once a night to urinate.  Hypothyroidism. On levothyroxin 75 mcgs daily. Compliant with medication.  He has remote history of esophageal cancer. Takes omeprazole chronically. No recent dysphagia.  Past Medical History  Diagnosis Date  . Hypertension   . Chronic kidney disease     kidney stones   . Esophageal cancer 1990  . Hypothyroidism   . Inguinal hernia February 2013    right  . Esophageal stricture   . Food impaction of esophagus   . Barrett esophagus   . Gastroparesis   . Aortic aneurysm, abdominal     repair  . Arthritis    Past Surgical History  Procedure Laterality Date  . Sp endo repair infraren aaa  8/09    es  . Coarctation of aorta repair  2009  . Inguinal hernia repair  09/01/11    bilateral  . Esophagogastroduodenoscopy  10/05/2011    Procedure: ESOPHAGOGASTRODUODENOSCOPY (EGD);  Surgeon: Ladene Artist, MD,FACG;  Location: [facility] END[facility]COPY;  Service: Endoscopy;  Laterality: N/A;  . Vagotomy    . Esophagectomy      partial  . Umbilical hernia repair    . Total hip arthroplasty Right 08/06/2012    Procedure: RIGHT TOTAL HIP ARTHOPLASTY WITH ACETABULAR AUTOGRAFT;  Surgeon: Gearlean Alf, MD;  Location: [facility] ORS;  Service: Orthopedics;  Laterality: Right;  RIGHT TOTAL HIP ARTHOPLASTY WITH ACETABULAR AUTOGRAFT     reports that he quit smoking about 44 years ago. His smoking use included Cigarettes. He has a 11 pack-year smoking history. He has never used smokeless tobacco. He  reports that he does not drink alcohol or use illicit drugs. family history includes Alzheimer's disease in his sister; Aneurysm in his daughter; Glaucoma in his sister; Pancreatic cancer in his father. No Known Allergies]    Review of Systems  Constitutional: Positive for fatigue.  Eyes: Negative for visual disturbance.  Respiratory: Negative for cough, chest tightness and shortness of breath.   Cardiovascular: Negative for chest pain, palpitations and leg swelling.  Endocrine: Negative for polydipsia and polyuria.  Neurological: Negative for dizziness, syncope, weakness, light-headedness and headaches.       Objective:   Physical Exam  Constitutional: He is oriented to person, place, and time. He appears well-developed and well-nourished.  HENT:  Right Ear: External ear normal.  Left Ear: External ear normal.  Mouth/Throat: Oropharynx is clear and moist.  Eyes: Pupils are equal, round, and reactive to light.  Neck: Neck supple. No thyromegaly present.  Cardiovascular: Normal rate and regular rhythm.   Pulmonary/Chest: Effort normal and breath sounds normal. No respiratory distress. He has no wheezes. He has no rales.  Musculoskeletal: He exhibits no edema.  Neurological: He is alert and oriented to person, place, and time.          Assessment & Plan:  #1 hypothyroidism. On levothyroxine for replacement. Recheck TSH #2 chronic kidney disease. Avoid nonsteroidals. Recheck basic metabolic panel. Continue amlodipine and HCTZ. #3 history of GERD stable on omeprazole. Recent B12 level normal #4  hypertension stable and at goal. Continue current medications

## 2015-05-28 NOTE — Progress Notes (Signed)
Pre visit review using our clinic review tool, if applicable. No additional management support is needed unless otherwise documented below in the visit note. 

## 2015-06-12 ENCOUNTER — Other Ambulatory Visit: Payer: Self-pay | Admitting: Family Medicine

## 2015-06-19 ENCOUNTER — Other Ambulatory Visit: Payer: Self-pay | Admitting: Family Medicine

## 2015-07-26 ENCOUNTER — Other Ambulatory Visit: Payer: Self-pay | Admitting: Family Medicine

## 2015-08-29 ENCOUNTER — Ambulatory Visit (INDEPENDENT_AMBULATORY_CARE_PROVIDER_SITE_OTHER): Payer: Medicare Other | Admitting: Family Medicine

## 2015-08-29 ENCOUNTER — Encounter: Payer: Self-pay | Admitting: Family Medicine

## 2015-08-29 VITALS — BP 130/60 | HR 59 | Temp 98.2°F | Ht 67.0 in | Wt 126.8 lb

## 2015-08-29 DIAGNOSIS — E039 Hypothyroidism, unspecified: Secondary | ICD-10-CM

## 2015-08-29 DIAGNOSIS — I5041 Acute combined systolic (congestive) and diastolic (congestive) heart failure: Secondary | ICD-10-CM

## 2015-08-29 DIAGNOSIS — I359 Nonrheumatic aortic valve disorder, unspecified: Secondary | ICD-10-CM | POA: Diagnosis not present

## 2015-08-29 DIAGNOSIS — I10 Essential (primary) hypertension: Secondary | ICD-10-CM | POA: Diagnosis not present

## 2015-08-29 DIAGNOSIS — I358 Other nonrheumatic aortic valve disorders: Secondary | ICD-10-CM

## 2015-08-29 DIAGNOSIS — R5383 Other fatigue: Secondary | ICD-10-CM

## 2015-08-29 DIAGNOSIS — N184 Chronic kidney disease, stage 4 (severe): Secondary | ICD-10-CM | POA: Diagnosis not present

## 2015-08-29 LAB — COMPREHENSIVE METABOLIC PANEL
ALBUMIN: 3.9 g/dL (ref 3.5–5.2)
ALK PHOS: 68 U/L (ref 39–117)
ALT: 11 U/L (ref 0–53)
AST: 19 U/L (ref 0–37)
BUN: 39 mg/dL — AB (ref 6–23)
CALCIUM: 10.1 mg/dL (ref 8.4–10.5)
CO2: 21 mEq/L (ref 19–32)
CREATININE: 1.7 mg/dL — AB (ref 0.40–1.50)
Chloride: 109 mEq/L (ref 96–112)
GFR: 41.03 mL/min — ABNORMAL LOW (ref >60.00)
Glucose, Bld: 97 mg/dL (ref 70–99)
POTASSIUM: 4.3 meq/L (ref 3.5–5.1)
SODIUM: 139 meq/L (ref 135–145)
TOTAL PROTEIN: 6.5 g/dL (ref 6.0–8.3)
Total Bilirubin: 0.7 mg/dL (ref 0.2–1.2)

## 2015-08-29 LAB — CBC WITH DIFFERENTIAL/PLATELET
BASOS PCT: 0.5 % (ref 0.0–3.0)
Basophils Absolute: 0 10*3/uL (ref 0.0–0.1)
EOS PCT: 1.6 % (ref 0.0–5.0)
Eosinophils Absolute: 0.1 10*3/uL (ref 0.0–0.7)
HEMATOCRIT: 33 % — AB (ref 39.0–52.0)
Hemoglobin: 11.2 g/dL — ABNORMAL LOW (ref 13.0–17.0)
LYMPHS ABS: 1.5 10*3/uL (ref 0.7–4.0)
LYMPHS PCT: 28.3 % (ref 12.0–46.0)
MCHC: 33.9 g/dL (ref 30.0–36.0)
MCV: 87.7 fl (ref 78.0–100.0)
MONOS PCT: 7.6 % (ref 3.0–12.0)
Monocytes Absolute: 0.4 10*3/uL (ref 0.1–1.0)
NEUTROS ABS: 3.2 10*3/uL (ref 1.4–7.7)
NEUTROS PCT: 62 % (ref 43.0–77.0)
PLATELETS: 156 10*3/uL (ref 150.0–400.0)
RBC: 3.76 Mil/uL — ABNORMAL LOW (ref 4.22–5.81)
RDW: 15.8 % — AB (ref 11.5–15.5)
WBC: 5.1 10*3/uL (ref 4.0–10.5)

## 2015-08-29 LAB — TSH: TSH: 7.44 u[IU]/mL — ABNORMAL HIGH (ref 0.35–4.50)

## 2015-08-29 MED ORDER — AMLODIPINE BESYLATE 10 MG PO TABS: 10.0000 mg | ORAL_TABLET | Freq: Every day | ORAL | Status: DC

## 2015-08-29 MED ORDER — TAMSULOSIN HCL 0.4 MG PO CAPS: ORAL_CAPSULE | ORAL | Status: DC

## 2015-08-29 MED ORDER — HYDROCHLOROTHIAZIDE 12.5 MG PO CAPS: 12.5000 mg | ORAL_CAPSULE | Freq: Every day | ORAL | Status: AC

## 2015-08-29 MED ORDER — TAMSULOSIN HCL 0.4 MG PO CAPS: ORAL_CAPSULE | ORAL | Status: AC

## 2015-08-29 NOTE — Progress Notes (Addendum)
Subjective:    Patient ID: Isaiah Graham, male    DOB: Oct 10, 1929, 82 y.o.   MRN: [id number]  HPI  Patient seen for medical follow-up  accompanied by his daughter. Nonspecific fatigue over several months.  Appetite stable and weight stable.  Has become much less active over recent months. Denies any recent chest pains. Daughter states he has couple of small decubitus sores on his buttocks. No urine or stool incontinence. Spends much more time sitting than he used to. Used to be very active.   Chronic problems include remote history of esophagus cancer, esophageal stricture, hypothyroidism, osteoarthritis, GERD, hypertension, chronic kidney disease stage IV, BPH.   Medications reviewed. Compliant with all. Blood pressure stable. No recent dizziness. He denies any dyspnea but does have decreased exercise tolerance recently.  Past Medical History  Diagnosis Date  . Hypertension   . Chronic kidney disease     kidney stones   . Esophageal cancer ([place]) 1990  . Hypothyroidism   . Inguinal hernia February 2013    right  . Esophageal stricture   . Food impaction of esophagus   . Barrett esophagus   . Gastroparesis   . Aortic aneurysm, abdominal (HCC)     repair  . Arthritis    Past Surgical History  Procedure Laterality Date  . Sp endo repair infraren aaa  8/09    es  . Coarctation of aorta repair  2009  . Inguinal hernia repair  09/01/11    bilateral  . Esophagogastroduodenoscopy  10/05/2011    Procedure: ESOPHAGOGASTRODUODENOSCOPY (EGD);  Surgeon: Ladene Artist, MD,FACG;  Location[facility] ENDOSCOPY;  Service: Endoscopy;  Laterality: N/A;  . Vagotomy    . Esophagectomy      partial  . Umbilical hernia repair    . Total hip arthroplasty Right 08/06/2012    Procedure: RIGHT TOTAL HIP ARTHOPLASTY WITH ACETABULAR AUTOGRAFT;  Surgeon: Gearlean Alf, MD;  Location: [facility] ORS;  Service: Orthopedics;  Laterality: Right;  RIGHT TOTAL HIP ARTHOPLASTY WITH ACETABULAR AUTOGRAFT     reports that he quit smoking about 44 years ago. His smoking use included Cigarettes. He has a 11 pack-year smoking history. He has never used smokeless tobacco. He reports that he does not drink alcohol or use illicit drugs. family history includes Alzheimer's disease in his sister; Aneurysm in his daughter; Glaucoma in his sister; Pancreatic cancer in his father. No Known Allergies    Review of Systems  Constitutional: Positive for fatigue. Negative for chills and unexpected weight change.  HENT: Negative for trouble swallowing.   Eyes: Negative for visual disturbance.  Respiratory: Negative for cough, chest tightness and shortness of breath.   Cardiovascular: Negative for chest pain, palpitations and leg swelling.  Gastrointestinal: Negative for abdominal pain.  Endocrine: Negative for polydipsia and polyuria.  Genitourinary: Negative for dysuria.  Neurological: Negative for dizziness, syncope, weakness, light-headedness and headaches.       Objective:   Physical Exam  Constitutional: He appears well-developed and well-nourished.  Neck: Neck supple.  Cardiovascular: Normal rate and regular rhythm.   Murmur heard. Prominent 3/6 systolic murmur most prominent right upper sternal border  Pulmonary/Chest: Effort normal and breath sounds normal. No respiratory distress. He has no wheezes. He has no rales.  Musculoskeletal: He exhibits edema.  Skin:  Patient has very small superficial stage II decubitus right upper buttock about 2 mm diameter. Left buttock 6 x 7 mm stage II decubitus. No signs of secondary infection  Assessment & Plan:   #1 nonspecific fatigue. Check further labs including CBC, compresses metabolic panel, TSH   #2 hypothyroidism. Recent TSH 4.7  Recheck and adjust medications as needed.   #3 hypertension. Stable and at goal   #4 prominent aortic heart murmur. Question if this is causing some of his fatigue issues. He has not had any syncope, chest  pain, or reported dyspnea with activity. Obtain echocardiogram to further assess   #5 stage II decubitus ulcers right and left buttock. Applied Tegaderm. They will change this every 4 days. Reassess here 2 weeks.  Nutrition discussed.  Echocardiogram revealed severe systolic heart failure with ejection fraction 20-25%. This is a significant change impaired with prior echo. He has global hypokinesis with akinesis of the inferolateral myocardium. Patient has no dyspnea at rest and minimal with activity. Set up cardiology referral. Patient has been notified of results

## 2015-08-29 NOTE — Progress Notes (Signed)
Pre visit review using our clinic review tool, if applicable. No additional management support is needed unless otherwise documented below in the visit note. 

## 2015-08-29 NOTE — Patient Instructions (Signed)
Pressure Injury °A pressure injury, sometimes called a bedsore, is an injury to the skin and underlying tissue caused by pressure. Pressure on blood vessels causes decreased blood flow to the skin, which can eventually cause the skin tissue to die and break down into a wound. °Pressure injuries usually occur: °· Over bony parts of the body such as the tailbone, shoulders, elbows, hips, and heels. °· Under medical devices such as respiratory equipment, stockings, tubes, and splints. °Pressure injuries start as reddened areas on the skin and can lead to pain, muscle damage, and infection. Pressure injuries can vary in severity.  °CAUSES °Pressure injuries are caused by a lack of blood supply to an area of skin. They can occur from intense pressure over a short period of time or from less intense pressure over a long period of time. °RISK FACTORS °This condition is more likely to develop in people who: °· Are in the hospital or an extended care facility. °· Are bedridden or in a wheelchair. °· Have an injury or disease that keeps them from: °¨ Moving normally. °¨ Feeling pain or pressure. °· Have a condition that: °¨ Makes them sleepy or less alert. °¨ Causes poor blood flow. °· Need to wear a medical device. °· Have poor control of their bladder or bowel functions (incontinence). °· Have poor nutrition (malnutrition). °· Are of certain ethnicities. People of African American and Latino or Hispanic descent are at higher risk compared to other ethnic groups. °If you are at risk for pressure ulcers, your health care provider may recommend certain types of bedding to help prevent them. These may include foam or gel mattresses covered with one of the following: °· A sheepskin blanket. °· A pad that is filled with gel, air, water, or foam. °SYMPTOMS  °The main symptom is a blister or change in skin color that opens into a wound. Other symptoms include:  °· Red or dark areas of skin that do not turn white or pale when  pressed with a finger.   °· Pain, warmth, or change of skin texture.   °DIAGNOSIS °This condition is diagnosed with a medical history and physical exam. You may also have tests, including:  °· Blood tests to check for infection or signs of poor nutrition. °· Imaging studies to check for damage to the deep tissues under your skin. °· Blood flow studies. °Your pressure injury will be staged to determine its severity. Staging is an assessment of: °· The depth of the pressure injury. °· Which tissues are exposed because of the pressure injury. °· The causes of the pressure injury.  °TREATMENT °The main focus of treatment is to help your injury heal. This may be done by:  °· Relieving or redistributing pressure on your skin. This includes: °¨ Frequently changing your position. °¨ Eliminating or minimizing positions that caused the wound or that can make the wound worse. °¨ Using specific bed mattresses and chair cushions. °¨ Refitting, resizing, or replacing any medical devices, or padding the skin under them. °¨ Using creams or powders to prevent rubbing (friction) on the skin. °· Keeping your skin clean and dry. This may include using a skin cleanser or skin protectant as told by your health care provider. This may be a lotion, ointment, or spray. °· Cleaning your injury and removing any dead tissue from the wound (debridement). °· Placing a bandage (dressing) over your injury. °· Preventing or treating infection. This may include antibiotic, antimicrobial, or antiseptic medicines. °Treatment may also include medicine for pain. °  Sometimes surgery is needed to close the wound with a flap of healthy skin or a piece of skin from another area of your body (graft). You may need surgery if other treatments are not working or if your injury is very deep. °HOME CARE INSTRUCTIONS °Wound Care °· Follow instructions from your health care provider about: °¨ How to take care of your wound. °¨ When and how you should change your  dressing. °¨ When you should remove your dressing. If your dressing is dry and stuck when you try to remove it, moisten or wet the dressing with saline or water so that it can be removed without harming your skin or wound tissue. °· Check your wound every day for signs of infection. Have a caregiver do this for you if you are not able. Watch for: °¨ More redness, swelling, or pain. °¨ More fluid, blood, or pus. °¨ A bad smell. °Skin Care °· Keep your skin clean and dry. Gently pat your skin dry. °· Do not rub or massage your skin. °· Use a skin protectant only as told by your health care provider. °· Check your skin every day for any changes in color or any new blisters or sores (ulcers). Have a caregiver do this for you if you are not able. °Medicines  °· Take over-the-counter and prescription medicines only as told by your health care provider. °· If you were prescribed an antibiotic medicine, take it or apply it as told by your health care provider. Do not stop taking or using the antibiotic even if your condition improves. °Reducing and Redistributing Pressure °· Do not lie or sit in one position for a long time. Move or change position every two hours or as told by your health care provider. °· Use pillows or cushions to reduce pressure. Ask your health care provider to recommend cushions or pads for you. °· Use medical devices that do not rub your skin. Tell your health care provider if one of your medical devices is causing a pressure injury to develop. °General Instructions °· Eat a healthy diet that includes lots of protein. Ask your health care provider for diet advice. °· Drink enough fluid to keep your urine clear or pale yellow. °· Be as active as you can every day. Ask your health care provider to suggest safe exercises or activities. °· Do not abuse drugs or alcohol. °· Keep all follow-up visits as told by your health care provider. This is important. °· Do not smoke. °SEEK MEDICAL CARE IF: °· You  have chills or fever. °· Your pain medicine is not helping. °· You have any changes in skin color. °· You have new blisters or sores. °· You develop warmth, redness, or swelling near a pressure injury. °· You have a bad odor or pus coming from your pressure injury. °· You lose control of your bowels or bladder. °· You develop new symptoms. °· Your wound does not improve after 1-2 weeks of treatment. °· You develop a new medical condition, such as diabetes, peripheral vascular disease, or conditions that affect your defense (immune) system. °  °This information is not intended to replace advice given to you by your health care provider. Make sure you discuss any questions you have with your health care provider. °  °Document Released: 06/09/2005 Document Revised: 02/28/2015 Document Reviewed: 10/18/2014 °Elsevier Interactive Patient Education ©2016 [employer] ° °

## 2015-09-03 MED ORDER — LEVOTHYROXINE SODIUM 88 MCG PO TABS: 88.0000 ug | ORAL_TABLET | Freq: Every day | ORAL | Status: DC

## 2015-09-03 NOTE — Addendum Note (Signed)
Addended by: Elio Forget on: 09/03/2015 03:26 PM   Modules accepted: Orders, Medications

## 2015-09-07 MED ORDER — LEVOTHYROXINE SODIUM 88 MCG PO TABS: 88.0000 ug | ORAL_TABLET | Freq: Every day | ORAL | Status: AC

## 2015-09-07 NOTE — Addendum Note (Signed)
Addended by: Elio Forget on: 09/07/2015 10:47 AM   Modules accepted: Orders

## 2015-09-13 ENCOUNTER — Encounter (HOSPITAL_COMMUNITY): Payer: Self-pay | Admitting: Radiology

## 2015-09-13 ENCOUNTER — Ambulatory Visit (HOSPITAL_COMMUNITY): Payer: Medicare Other | Attending: Cardiology

## 2015-09-13 ENCOUNTER — Other Ambulatory Visit: Payer: Self-pay

## 2015-09-13 DIAGNOSIS — N184 Chronic kidney disease, stage 4 (severe): Secondary | ICD-10-CM | POA: Diagnosis not present

## 2015-09-13 DIAGNOSIS — I77810 Thoracic aortic ectasia: Secondary | ICD-10-CM | POA: Diagnosis not present

## 2015-09-13 DIAGNOSIS — I358 Other nonrheumatic aortic valve disorders: Secondary | ICD-10-CM

## 2015-09-13 DIAGNOSIS — Z87891 Personal history of nicotine dependence: Secondary | ICD-10-CM | POA: Insufficient documentation

## 2015-09-13 DIAGNOSIS — J90 Pleural effusion, not elsewhere classified: Secondary | ICD-10-CM | POA: Insufficient documentation

## 2015-09-13 DIAGNOSIS — R5383 Other fatigue: Secondary | ICD-10-CM | POA: Insufficient documentation

## 2015-09-13 DIAGNOSIS — I313 Pericardial effusion (noninflammatory): Secondary | ICD-10-CM | POA: Diagnosis not present

## 2015-09-13 DIAGNOSIS — R29898 Other symptoms and signs involving the musculoskeletal system: Secondary | ICD-10-CM | POA: Diagnosis not present

## 2015-09-13 DIAGNOSIS — I1310 Hypertensive heart and chronic kidney disease without heart failure, with stage 1 through stage 4 chronic kidney disease, or unspecified chronic kidney disease: Secondary | ICD-10-CM | POA: Insufficient documentation

## 2015-09-13 DIAGNOSIS — I340 Nonrheumatic mitral (valve) insufficiency: Secondary | ICD-10-CM | POA: Insufficient documentation

## 2015-09-13 DIAGNOSIS — I359 Nonrheumatic aortic valve disorder, unspecified: Secondary | ICD-10-CM

## 2015-09-13 DIAGNOSIS — I352 Nonrheumatic aortic (valve) stenosis with insufficiency: Secondary | ICD-10-CM | POA: Diagnosis not present

## 2015-09-13 NOTE — Progress Notes (Signed)
Spoke with Jari Sportsman, CMA, Dr. Erick Blinks nurse regarding the patient's echocardiogram. She is aware to be on the look out for echo results.

## 2015-09-14 NOTE — Addendum Note (Signed)
Addended by: Eulas Post on: 09/14/2015 08:42 AM   Modules accepted: Orders

## 2015-09-17 ENCOUNTER — Encounter: Payer: Self-pay | Admitting: Cardiology

## 2015-09-17 ENCOUNTER — Ambulatory Visit (INDEPENDENT_AMBULATORY_CARE_PROVIDER_SITE_OTHER): Payer: Medicare Other | Admitting: Cardiology

## 2015-09-17 VITALS — BP 146/52 | HR 57 | Ht 67.0 in | Wt 123.1 lb

## 2015-09-17 DIAGNOSIS — I350 Nonrheumatic aortic (valve) stenosis: Secondary | ICD-10-CM

## 2015-09-17 DIAGNOSIS — I420 Dilated cardiomyopathy: Secondary | ICD-10-CM | POA: Diagnosis not present

## 2015-09-17 DIAGNOSIS — I351 Nonrheumatic aortic (valve) insufficiency: Secondary | ICD-10-CM | POA: Insufficient documentation

## 2015-09-17 DIAGNOSIS — I10 Essential (primary) hypertension: Secondary | ICD-10-CM

## 2015-09-17 HISTORY — DX: Nonrheumatic aortic (valve) stenosis: I35.0

## 2015-09-17 HISTORY — DX: Dilated cardiomyopathy: I42.0

## 2015-09-17 NOTE — Patient Instructions (Signed)
Medication Instructions:  Your physician recommends that you continue on your current medications as directed. Please refer to the Current Medication list given to you today.   Labwork: None  Testing/Procedures: Dr. Radford Pax recommends you have a dobutamine ECHO.  Follow-Up: Your physician recommends that you schedule a follow-up appointment AS NEEDED with Dr. Radford Pax pending study results.  Any Other Special Instructions Will Be Listed Below (If Applicable).     If you need a refill on your cardiac medications before your next appointment, please call your pharmacy.

## 2015-09-17 NOTE — Progress Notes (Signed)
Cardiology Office Note   Date:  09/17/2015   ID:  Isaiah Graham, DOB [birth year], MRN [id number]  PCP:  Eulas Post, MD    Chief Complaint  Patient presents with  . Cardiomyopathy  . Hypertension  . Aortic Stenosis      History of Present Illness: Isaiah Graham is a 82 y.o. male who presents for evaluation of chest pain.  He has a history of esophageal stricture and CA s/p resection in 1996, hypothyroidism, GERD, HTN and CKD stage 4. He recently saw his PCP for PE and complained of fatigue.  He was noted to have a heart murmur and a 2D echo was ordered which showed severe LV dysfunction with EF 20-25% with global HK and inferolateral AK, moderate AS, moderate AR and moderate pulmonary HTN.  He is now referred for further evaluation.  He denies any chest pain, SOB, DOE, LE edema, dizziness, palpitations or syncope.  He has complained recently of exertional fatigue and weakness in his legs with exertion that has been going on for a few months.      Past Medical History  Diagnosis Date  . Hypertension   . Chronic kidney disease     kidney stones   . Esophageal cancer ([place]) 1990  . Hypothyroidism   . Inguinal hernia February 2013    right  . Esophageal stricture   . Food impaction of esophagus   . Barrett esophagus   . Gastroparesis   . Aortic aneurysm, abdominal (HCC)     repair  . Arthritis     Past Surgical History  Procedure Laterality Date  . Sp endo repair infraren aaa  8/09    es  . Coarctation of aorta repair  2009  . Inguinal hernia repair  09/01/11    bilateral  . Esophagogastroduodenoscopy  10/05/2011    Procedure: ESOPHAGOGASTRODUODENOSCOPY (EGD);  Surgeon: Ladene Artist, MD,FACG;  Location: [facility] ENDOSCOPY;  Service: Endoscopy;  Laterality: N/A;  . Vagotomy    . Esophagectomy      partial  . Umbilical hernia repair    . Total hip arthroplasty Right 08/06/2012    Procedure: RIGHT TOTAL HIP ARTHOPLASTY WITH ACETABULAR  AUTOGRAFT;  Surgeon: Gearlean Alf, MD;  Location: [facility] ORS;  Service: Orthopedics;  Laterality: Right;  RIGHT TOTAL HIP ARTHOPLASTY WITH ACETABULAR AUTOGRAFT      Current Outpatient Prescriptions  Medication Sig Dispense Refill  . amLODipine (NORVASC) 10 MG tablet Take 1 tablet (10 mg total) by mouth daily. 90 tablet 3  . aspirin 81 MG tablet Take 81 mg by mouth daily.    [name] Calcium (STOOL SOFTENER PO) Take 2 tablets by mouth daily.    . hydrochlorothiazide (MICROZIDE) 12.5 MG capsule Take 1 capsule (12.5 mg total) by mouth daily. 90 capsule 3  . levothyroxine (SYNTHROID, LEVOTHROID) 88 MCG tablet Take 1 tablet (88 mcg total) by mouth daily. 90 tablet 2  . mirtazapine (REMERON) 15 MG tablet TAKE 1 TABLET AT BEDTIME 90 tablet 2  . Omega-3 Fatty Acids (FISH OIL PO) Take 1 capsule by mouth daily.    Marland Kitchen omeprazole (PRILOSEC) 20 MG capsule TAKE 1 CAPSULE DAILY 90 capsule 2  . tamsulosin (FLOMAX) 0.4 MG CAPS capsule TAKE 1 CAPSULE (0.4 MG TOTAL) BY MOUTH DAILY AFTER SUPPER. 90 capsule 3   No current facility-administered medications for this visit.    Allergies:   Review of patient's  allergies indicates no known allergies.    Social History:  The patient  reports that he quit smoking about 44 years ago. His smoking use included Cigarettes. He has a 11 pack-year smoking history. He has never used smokeless tobacco. He reports that he does not drink alcohol or use illicit drugs.   Family History:  The patient's 52family history includes Alzheimer's disease in his sister; Aneurysm in his daughter; Glaucoma in his sister; Pancreatic cancer in his father.    ROS:  Please see the history of present illness.   Otherwise, review of systems are positive for none.   All other systems are reviewed and negative.    PHYSICAL EXAM: VS:  BP 146/52 mmHg  Pulse 57  Ht 5\' 7"  (1.702 m)  Wt 123 lb 1.9 oz (55.847 kg)  BMI 19.28 kg/m2 , BMI Body mass index is 19.28 kg/(m^2). GEN: Well nourished, well  developed, in no acute distress HEENT: normal Neck: no JVD, carotid bruits, or masses Cardiac: RRR; no murmurs, rubs, or gallops,no edema  Respiratory:  clear to auscultation bilaterally, normal work of breathing GI: soft, nontender, nondistended, + BS MS: no deformity or atrophy Skin: warm and dry, no rash Neuro:  Strength and sensation are intact Psych: euthymic mood, full affect   EKG:  EKG is ordered today. The ekg ordered today demonstrates sinus bradycardia at 57bpm with LVH and repol abnormality and nonspecific IVCD   Recent Labs: 08/29/2015: ALT 11; BUN 39*; Creatinine, Ser 1.70*; Hemoglobin 11.2*; Platelets 156.0; Potassium 4.3; Sodium 139; TSH 7.44*    Lipid Panel    Component Value Date/Time   CHOL 196 01/31/2013 1639   TRIG 94.0 01/31/2013 1639   HDL 46.20 01/31/2013 1639   CHOLHDL 4 01/31/2013 1639   VLDL 18.8 01/31/2013 1639   LDLCALC 131* 01/31/2013 1639   LDLDIRECT 143.0 12/04/2010 1157      Wt Readings from Last 3 Encounters:  09/17/15 123 lb 1.9 oz (55.847 kg)  08/29/15 126 lb 12.8 oz (57.516 kg)  01/24/15 126 lb (57.153 kg)       ASSESSMENT AND PLAN:  1.  Severe LV dysfunction ? Etiology. Echo showed regional wall motion abnormalities and diffuse HK. He also had moderate AS on echo which I suspect is underestimated and is probably severe given LV dysfunction and could also be etiology of DCM.  Will not add BB due to resting bradycardia.  No ACE I/ARB due to CKD.  Consider adding hydralazine/nitrates for LV dysfunction but want to assess severity of AS first.  2.  Moderate AS by mean gradient and peak velocity but this may be underestimated given his LV dysfunction.  Will assess further with dobutamine echo and then if appears severe, will refer to Dr. Burt Knack for evaluation for TAVR.  I do not think he would be a good operative candidate for open AVR given history of esophageal CA with extensive esophageal resection as well as debilitated state.   3.  HTN  -borderline controlled 4.  Exertional fatigue which I suspect is due to LV dysfunction and AS. I will get a dobutamine echo to assess for ischemia as well as determine if AV gradient increases with improved LVF during dobutamine infusion.     Current medicines are reviewed at length with the patient today.  The patient does not have concerns regarding medicines.  The following changes have been made:  no change  Labs/ tests ordered today: See above Assessment and Plan No orders of the defined types were  placed in this encounter.     Disposition:   FU with me once dobutamine results are back  SignedSueanne Margarita, MD  09/17/2015 9:19 AM    [facility] [place], [place], [place]  [address] Phone: [phone]; Fax: [phone]

## 2015-09-18 ENCOUNTER — Telehealth (HOSPITAL_COMMUNITY): Payer: Self-pay | Admitting: *Deleted

## 2015-09-18 NOTE — Telephone Encounter (Signed)
Patient given detailed instructions per Stress Test Requisition Sheet for test on 09/20/15 at 7:30.Patient Notified to arrive 30 minutes early, and that it is imperative to arrive on time for appointment to keep from having the test rescheduled.  Patient verbalized understanding. Veronia Beets

## 2015-09-20 ENCOUNTER — Ambulatory Visit (HOSPITAL_BASED_OUTPATIENT_CLINIC_OR_DEPARTMENT_OTHER): Payer: Medicare Other

## 2015-09-20 ENCOUNTER — Telehealth: Payer: Self-pay | Admitting: Family Medicine

## 2015-09-20 ENCOUNTER — Ambulatory Visit (HOSPITAL_COMMUNITY): Payer: Medicare Other | Attending: Cardiovascular Disease

## 2015-09-20 DIAGNOSIS — R0989 Other specified symptoms and signs involving the circulatory and respiratory systems: Secondary | ICD-10-CM

## 2015-09-20 DIAGNOSIS — I255 Ischemic cardiomyopathy: Secondary | ICD-10-CM | POA: Diagnosis not present

## 2015-09-20 DIAGNOSIS — I420 Dilated cardiomyopathy: Secondary | ICD-10-CM | POA: Diagnosis not present

## 2015-09-20 DIAGNOSIS — I352 Nonrheumatic aortic (valve) stenosis with insufficiency: Secondary | ICD-10-CM | POA: Insufficient documentation

## 2015-09-20 DIAGNOSIS — I351 Nonrheumatic aortic (valve) insufficiency: Secondary | ICD-10-CM

## 2015-09-20 DIAGNOSIS — I350 Nonrheumatic aortic (valve) stenosis: Secondary | ICD-10-CM | POA: Diagnosis present

## 2015-09-20 MED ORDER — DOBUTAMINE INFUSION FOR EP/ECHO/NUC (1000 MCG/ML)
20.0000 ug/kg/min | Freq: Once | INTRAVENOUS | Status: AC
  Administered 2015-09-20: 20 ug/kg/min via INTRAVENOUS

## 2015-09-20 NOTE — Telephone Encounter (Signed)
yes

## 2015-09-20 NOTE — Telephone Encounter (Signed)
Pt's daughter called to request samples of the covering Dr Elease Hashimoto gave to pt when he was seen last  for his bed sore. She states the sore is much better.

## 2015-09-20 NOTE — Telephone Encounter (Signed)
Okay to provide her with a few more of the tegaderm pads?

## 2015-09-21 NOTE — Telephone Encounter (Signed)
Spoke with daughter and she will let Martez know that pads are up front to be picked up.

## 2015-09-25 ENCOUNTER — Telehealth: Payer: Self-pay | Admitting: Cardiology

## 2015-09-25 NOTE — Telephone Encounter (Signed)
New Message:  Pt's daughter is calling in to get the results to the pt's Stress Echo that was done on 3/30. Please f/u with her

## 2015-09-25 NOTE — Telephone Encounter (Signed)
[facility] called to get results of stress ECHO. Informed her that they will be called as soon as Dr. Radford Pax results them.  She was grateful for call.

## 2015-09-28 ENCOUNTER — Telehealth: Payer: Self-pay | Admitting: Cardiology

## 2015-09-28 ENCOUNTER — Encounter: Payer: Self-pay | Admitting: *Deleted

## 2015-09-28 DIAGNOSIS — I350 Nonrheumatic aortic (valve) stenosis: Secondary | ICD-10-CM

## 2015-09-28 DIAGNOSIS — Z01812 Encounter for preprocedural laboratory examination: Secondary | ICD-10-CM

## 2015-09-28 NOTE — Telephone Encounter (Signed)
Scheduled pt for cath on 4/26 with Dr. Burt Knack at [place].  Spoke with Dawn in [employer] and scheduled pt to come in for hydration the day before.  They will contact pt when bed is ready. Spoke with daughter, Kennis Carina, and informed her of cath date and time and we over instructions. Scheduled pre procedure labs for 4/17. Daughter verbalized understanding and was in agreement with this plan.

## 2015-09-28 NOTE — Telephone Encounter (Signed)
See note from stress echo results

## 2015-09-28 NOTE — Telephone Encounter (Signed)
Pt's dtr calling re results from stress test from last week-pls call

## 2015-09-28 NOTE — Telephone Encounter (Signed)
Will have Dr Radford Pax review and call the daughter after with her recommendations

## 2015-10-08 ENCOUNTER — Other Ambulatory Visit (INDEPENDENT_AMBULATORY_CARE_PROVIDER_SITE_OTHER): Payer: Medicare Other | Admitting: *Deleted

## 2015-10-08 ENCOUNTER — Telehealth: Payer: Self-pay | Admitting: Family Medicine

## 2015-10-08 ENCOUNTER — Emergency Department (HOSPITAL_COMMUNITY): Payer: Medicare Other

## 2015-10-08 ENCOUNTER — Encounter (HOSPITAL_COMMUNITY): Payer: Self-pay

## 2015-10-08 ENCOUNTER — Telehealth: Payer: Self-pay | Admitting: *Deleted

## 2015-10-08 DIAGNOSIS — I130 Hypertensive heart and chronic kidney disease with heart failure and stage 1 through stage 4 chronic kidney disease, or unspecified chronic kidney disease: Secondary | ICD-10-CM | POA: Diagnosis present

## 2015-10-08 DIAGNOSIS — R109 Unspecified abdominal pain: Secondary | ICD-10-CM | POA: Diagnosis not present

## 2015-10-08 DIAGNOSIS — I255 Ischemic cardiomyopathy: Secondary | ICD-10-CM | POA: Diagnosis present

## 2015-10-08 DIAGNOSIS — Z800 Family history of malignant neoplasm of digestive organs: Secondary | ICD-10-CM

## 2015-10-08 DIAGNOSIS — Z96641 Presence of right artificial hip joint: Secondary | ICD-10-CM | POA: Diagnosis present

## 2015-10-08 DIAGNOSIS — Z7982 Long term (current) use of aspirin: Secondary | ICD-10-CM

## 2015-10-08 DIAGNOSIS — Z8501 Personal history of malignant neoplasm of esophagus: Secondary | ICD-10-CM

## 2015-10-08 DIAGNOSIS — Z01812 Encounter for preprocedural laboratory examination: Secondary | ICD-10-CM

## 2015-10-08 DIAGNOSIS — K3184 Gastroparesis: Secondary | ICD-10-CM | POA: Diagnosis present

## 2015-10-08 DIAGNOSIS — E039 Hypothyroidism, unspecified: Secondary | ICD-10-CM | POA: Diagnosis present

## 2015-10-08 DIAGNOSIS — I350 Nonrheumatic aortic (valve) stenosis: Secondary | ICD-10-CM | POA: Diagnosis present

## 2015-10-08 DIAGNOSIS — I272 Other secondary pulmonary hypertension: Secondary | ICD-10-CM | POA: Diagnosis present

## 2015-10-08 DIAGNOSIS — N400 Enlarged prostate without lower urinary tract symptoms: Secondary | ICD-10-CM | POA: Diagnosis present

## 2015-10-08 DIAGNOSIS — K4130 Unilateral femoral hernia, with obstruction, without gangrene, not specified as recurrent: Secondary | ICD-10-CM | POA: Diagnosis not present

## 2015-10-08 DIAGNOSIS — I420 Dilated cardiomyopathy: Secondary | ICD-10-CM | POA: Diagnosis present

## 2015-10-08 DIAGNOSIS — R64 Cachexia: Secondary | ICD-10-CM | POA: Diagnosis present

## 2015-10-08 DIAGNOSIS — R001 Bradycardia, unspecified: Secondary | ICD-10-CM | POA: Diagnosis not present

## 2015-10-08 DIAGNOSIS — R739 Hyperglycemia, unspecified: Secondary | ICD-10-CM | POA: Diagnosis present

## 2015-10-08 DIAGNOSIS — L89151 Pressure ulcer of sacral region, stage 1: Secondary | ICD-10-CM | POA: Diagnosis present

## 2015-10-08 DIAGNOSIS — I5042 Chronic combined systolic (congestive) and diastolic (congestive) heart failure: Secondary | ICD-10-CM | POA: Diagnosis present

## 2015-10-08 DIAGNOSIS — N179 Acute kidney failure, unspecified: Secondary | ICD-10-CM | POA: Diagnosis not present

## 2015-10-08 DIAGNOSIS — Z87891 Personal history of nicotine dependence: Secondary | ICD-10-CM

## 2015-10-08 DIAGNOSIS — Z66 Do not resuscitate: Secondary | ICD-10-CM | POA: Diagnosis present

## 2015-10-08 DIAGNOSIS — Z83511 Family history of glaucoma: Secondary | ICD-10-CM

## 2015-10-08 DIAGNOSIS — N184 Chronic kidney disease, stage 4 (severe): Secondary | ICD-10-CM | POA: Diagnosis present

## 2015-10-08 DIAGNOSIS — Z79899 Other long term (current) drug therapy: Secondary | ICD-10-CM

## 2015-10-08 LAB — BASIC METABOLIC PANEL
BUN: 49 mg/dL — ABNORMAL HIGH (ref 7–25)
CO2: 20 mmol/L (ref 20–31)
Calcium: 10.7 mg/dL — ABNORMAL HIGH (ref 8.6–10.3)
Chloride: 106 mmol/L (ref 98–110)
Creat: 2.12 mg/dL — ABNORMAL HIGH (ref 0.70–1.11)
Glucose, Bld: 106 mg/dL — ABNORMAL HIGH (ref 65–99)
Potassium: 4.5 mmol/L (ref 3.5–5.3)
SODIUM: 141 mmol/L (ref 135–146)

## 2015-10-08 LAB — CBC
HCT: 36.5 % — ABNORMAL LOW (ref 38.5–50.0)
HEMATOCRIT: 37.8 % — AB (ref 39.0–52.0)
HEMOGLOBIN: 12.4 g/dL — AB (ref 13.2–17.1)
HEMOGLOBIN: 12.7 g/dL — AB (ref 13.0–17.0)
MCH: 29.8 pg (ref 26.0–34.0)
MCH: 30 pg (ref 27.0–33.0)
MCHC: 33.6 g/dL (ref 30.0–36.0)
MCHC: 34 g/dL (ref 32.0–36.0)
MCV: 88.4 fL (ref 80.0–100.0)
MCV: 88.7 fL (ref 78.0–100.0)
MPV: 11.4 fL (ref 7.5–12.5)
Platelets: 173 10*3/uL (ref 150–400)
Platelets: 186 10*3/uL (ref 140–400)
RBC: 4.13 MIL/uL — AB (ref 4.20–5.80)
RBC: 4.26 MIL/uL (ref 4.22–5.81)
RDW: 14.4 % (ref 11.5–15.5)
RDW: 14.7 % (ref 11.0–15.0)
WBC: 7.3 10*3/uL (ref 3.8–10.8)
WBC: 9 10*3/uL (ref 4.0–10.5)

## 2015-10-08 LAB — PROTIME-INR
INR: 1.14 (ref <1.50)
Prothrombin Time: 14.7 seconds (ref 11.6–15.2)

## 2015-10-08 LAB — I-STAT TROPONIN, ED: TROPONIN I, POC: 0.06 ng/mL (ref 0.00–0.08)

## 2015-10-08 MED ORDER — AMLODIPINE BESYLATE 5 MG PO TABS: 5.0000 mg | ORAL_TABLET | Freq: Every day | ORAL | Status: DC

## 2015-10-08 NOTE — ED Notes (Signed)
Pt scheduled for heart cath on Wednesday. Complaining of nausea "all afternoon." Pt complaining of r flank pain and "burning when I belch."

## 2015-10-08 NOTE — Telephone Encounter (Signed)
Patient came to office today for scheduled labs, cardiac cath next week Daughter requested blood pressure check, patient feeling a little more weakness than before He had not eaten today  Spoke with patient and daughter and offered to move up cath to this week with Dr Angelena Form, patient only wants Dr Burt Knack Did take patients blood pressure, 124/50 and 122/48 Discussed with Dr Radford Pax and will have patient decrease Amlodipine to 5 mg daily  Advised patient and daughter, verbalized understanding

## 2015-10-09 ENCOUNTER — Encounter (HOSPITAL_COMMUNITY)
Admission: EM | Disposition: A | Discharge status: Home Health | Payer: Self-pay | Source: Home / Self Care | Attending: Internal Medicine

## 2015-10-09 ENCOUNTER — Inpatient Hospital Stay (HOSPITAL_COMMUNITY): Payer: Medicare Other | Admitting: Certified Registered Nurse Anesthetist

## 2015-10-09 ENCOUNTER — Inpatient Hospital Stay (HOSPITAL_COMMUNITY)
Admission: EM | Admit: 2015-10-09 | Discharge: 2015-10-12 | DRG: 351 | Disposition: A | Discharge status: Home Health | Payer: Medicare Other | Attending: Internal Medicine | Admitting: Internal Medicine

## 2015-10-09 ENCOUNTER — Emergency Department (HOSPITAL_COMMUNITY): Payer: Medicare Other

## 2015-10-09 ENCOUNTER — Inpatient Hospital Stay (HOSPITAL_COMMUNITY): Payer: Medicare Other

## 2015-10-09 ENCOUNTER — Encounter (HOSPITAL_COMMUNITY): Payer: Self-pay | Admitting: General Surgery

## 2015-10-09 DIAGNOSIS — E039 Hypothyroidism, unspecified: Secondary | ICD-10-CM | POA: Diagnosis present

## 2015-10-09 DIAGNOSIS — K3184 Gastroparesis: Secondary | ICD-10-CM | POA: Diagnosis present

## 2015-10-09 DIAGNOSIS — K4030 Unilateral inguinal hernia, with obstruction, without gangrene, not specified as recurrent: Secondary | ICD-10-CM

## 2015-10-09 DIAGNOSIS — Z8501 Personal history of malignant neoplasm of esophagus: Secondary | ICD-10-CM | POA: Diagnosis present

## 2015-10-09 DIAGNOSIS — N400 Enlarged prostate without lower urinary tract symptoms: Secondary | ICD-10-CM | POA: Diagnosis present

## 2015-10-09 DIAGNOSIS — R079 Chest pain, unspecified: Secondary | ICD-10-CM | POA: Diagnosis not present

## 2015-10-09 DIAGNOSIS — R739 Hyperglycemia, unspecified: Secondary | ICD-10-CM

## 2015-10-09 DIAGNOSIS — Z4659 Encounter for fitting and adjustment of other gastrointestinal appliance and device: Secondary | ICD-10-CM

## 2015-10-09 DIAGNOSIS — Z9049 Acquired absence of other specified parts of digestive tract: Secondary | ICD-10-CM | POA: Diagnosis not present

## 2015-10-09 DIAGNOSIS — I255 Ischemic cardiomyopathy: Secondary | ICD-10-CM | POA: Diagnosis present

## 2015-10-09 DIAGNOSIS — Z800 Family history of malignant neoplasm of digestive organs: Secondary | ICD-10-CM | POA: Diagnosis not present

## 2015-10-09 DIAGNOSIS — N179 Acute kidney failure, unspecified: Secondary | ICD-10-CM | POA: Diagnosis not present

## 2015-10-09 DIAGNOSIS — I10 Essential (primary) hypertension: Secondary | ICD-10-CM | POA: Diagnosis present

## 2015-10-09 DIAGNOSIS — Z83511 Family history of glaucoma: Secondary | ICD-10-CM | POA: Diagnosis not present

## 2015-10-09 DIAGNOSIS — Z87891 Personal history of nicotine dependence: Secondary | ICD-10-CM | POA: Diagnosis not present

## 2015-10-09 DIAGNOSIS — Z66 Do not resuscitate: Secondary | ICD-10-CM | POA: Diagnosis present

## 2015-10-09 DIAGNOSIS — I272 Other secondary pulmonary hypertension: Secondary | ICD-10-CM | POA: Diagnosis present

## 2015-10-09 DIAGNOSIS — L89151 Pressure ulcer of sacral region, stage 1: Secondary | ICD-10-CM | POA: Diagnosis present

## 2015-10-09 DIAGNOSIS — R001 Bradycardia, unspecified: Secondary | ICD-10-CM | POA: Diagnosis not present

## 2015-10-09 DIAGNOSIS — K56609 Unspecified intestinal obstruction, unspecified as to partial versus complete obstruction: Secondary | ICD-10-CM | POA: Diagnosis present

## 2015-10-09 DIAGNOSIS — Z9889 Other specified postprocedural states: Secondary | ICD-10-CM

## 2015-10-09 DIAGNOSIS — K4130 Unilateral femoral hernia, with obstruction, without gangrene, not specified as recurrent: Secondary | ICD-10-CM | POA: Diagnosis present

## 2015-10-09 DIAGNOSIS — I350 Nonrheumatic aortic (valve) stenosis: Secondary | ICD-10-CM

## 2015-10-09 DIAGNOSIS — K460 Unspecified abdominal hernia with obstruction, without gangrene: Secondary | ICD-10-CM | POA: Diagnosis present

## 2015-10-09 DIAGNOSIS — R64 Cachexia: Secondary | ICD-10-CM | POA: Diagnosis present

## 2015-10-09 DIAGNOSIS — Z419 Encounter for procedure for purposes other than remedying health state, unspecified: Secondary | ICD-10-CM

## 2015-10-09 DIAGNOSIS — Z7982 Long term (current) use of aspirin: Secondary | ICD-10-CM | POA: Diagnosis not present

## 2015-10-09 DIAGNOSIS — R109 Unspecified abdominal pain: Secondary | ICD-10-CM | POA: Diagnosis present

## 2015-10-09 DIAGNOSIS — I130 Hypertensive heart and chronic kidney disease with heart failure and stage 1 through stage 4 chronic kidney disease, or unspecified chronic kidney disease: Secondary | ICD-10-CM | POA: Diagnosis present

## 2015-10-09 DIAGNOSIS — I5042 Chronic combined systolic (congestive) and diastolic (congestive) heart failure: Secondary | ICD-10-CM | POA: Diagnosis present

## 2015-10-09 DIAGNOSIS — N184 Chronic kidney disease, stage 4 (severe): Secondary | ICD-10-CM

## 2015-10-09 DIAGNOSIS — K5669 Other intestinal obstruction: Secondary | ICD-10-CM

## 2015-10-09 DIAGNOSIS — Z96641 Presence of right artificial hip joint: Secondary | ICD-10-CM | POA: Diagnosis present

## 2015-10-09 DIAGNOSIS — L8990 Pressure ulcer of unspecified site, unspecified stage: Secondary | ICD-10-CM

## 2015-10-09 DIAGNOSIS — Z79899 Other long term (current) drug therapy: Secondary | ICD-10-CM | POA: Diagnosis not present

## 2015-10-09 DIAGNOSIS — I420 Dilated cardiomyopathy: Secondary | ICD-10-CM | POA: Diagnosis present

## 2015-10-09 HISTORY — DX: Chronic kidney disease, stage 4 (severe): N18.4

## 2015-10-09 HISTORY — PX: INGUINAL HERNIA REPAIR: SHX194

## 2015-10-09 LAB — COMPREHENSIVE METABOLIC PANEL
ALK PHOS: 62 U/L (ref 38–126)
ALT: 11 U/L — AB (ref 17–63)
ANION GAP: 13 (ref 5–15)
AST: 23 U/L (ref 15–41)
Albumin: 3.3 g/dL — ABNORMAL LOW (ref 3.5–5.0)
BUN: 64 mg/dL — ABNORMAL HIGH (ref 6–20)
CALCIUM: 10 mg/dL (ref 8.9–10.3)
CHLORIDE: 108 mmol/L (ref 101–111)
CO2: 18 mmol/L — ABNORMAL LOW (ref 22–32)
CREATININE: 2.33 mg/dL — AB (ref 0.61–1.24)
GFR, EST AFRICAN AMERICAN: 28 mL/min — AB (ref >60)
GFR, EST NON AFRICAN AMERICAN: 24 mL/min — AB (ref >60)
Glucose, Bld: 118 mg/dL — ABNORMAL HIGH (ref 65–99)
Potassium: 4.6 mmol/L (ref 3.5–5.1)
SODIUM: 139 mmol/L (ref 135–145)
Total Bilirubin: 1.2 mg/dL (ref 0.3–1.2)
Total Protein: 6.2 g/dL — ABNORMAL LOW (ref 6.5–8.1)

## 2015-10-09 LAB — MAGNESIUM: Magnesium: 2.1 mg/dL (ref 1.7–2.4)

## 2015-10-09 LAB — LACTIC ACID, PLASMA
Lactic Acid, Venous: 0.9 mmol/L (ref 0.5–2.0)
Lactic Acid, Venous: 0.8 mmol/L (ref 0.5–2.0)

## 2015-10-09 LAB — PROTIME-INR
INR: 1.2 (ref 0.00–1.49)
PROTHROMBIN TIME: 15.4 s — AB (ref 11.6–15.2)

## 2015-10-09 LAB — BASIC METABOLIC PANEL
Anion gap: 12 (ref 5–15)
BUN: 56 mg/dL — AB (ref 6–20)
CHLORIDE: 108 mmol/L (ref 101–111)
CO2: 19 mmol/L — ABNORMAL LOW (ref 22–32)
Calcium: 10.9 mg/dL — ABNORMAL HIGH (ref 8.9–10.3)
Creatinine, Ser: 2.31 mg/dL — ABNORMAL HIGH (ref 0.61–1.24)
GFR calc Af Amer: 28 mL/min — ABNORMAL LOW (ref >60)
GFR calc non Af Amer: 25 mL/min — ABNORMAL LOW (ref >60)
GLUCOSE: 132 mg/dL — AB (ref 65–99)
POTASSIUM: 4.5 mmol/L (ref 3.5–5.1)
Sodium: 139 mmol/L (ref 135–145)

## 2015-10-09 LAB — I-STAT CG4 LACTIC ACID, ED: LACTIC ACID, VENOUS: 1.32 mmol/L (ref 0.5–2.0)

## 2015-10-09 LAB — TSH: TSH: 2.754 u[IU]/mL (ref 0.350–4.500)

## 2015-10-09 LAB — MRSA PCR SCREENING: MRSA BY PCR: NEGATIVE

## 2015-10-09 LAB — URINALYSIS, ROUTINE W REFLEX MICROSCOPIC
GLUCOSE, UA: NEGATIVE mg/dL
HGB URINE DIPSTICK: NEGATIVE
Ketones, ur: 15 mg/dL — AB
Leukocytes, UA: NEGATIVE
Nitrite: NEGATIVE
PROTEIN: 30 mg/dL — AB
SPECIFIC GRAVITY, URINE: 1.022 (ref 1.005–1.030)
pH: 5 (ref 5.0–8.0)

## 2015-10-09 LAB — TROPONIN I
Troponin I: 0.08 ng/mL — ABNORMAL HIGH (ref <0.031)
Troponin I: 0.09 ng/mL — ABNORMAL HIGH (ref <0.031)

## 2015-10-09 LAB — CBG MONITORING, ED: GLUCOSE-CAPILLARY: 116 mg/dL — AB (ref 65–99)

## 2015-10-09 LAB — GLUCOSE, CAPILLARY: GLUCOSE-CAPILLARY: 175 mg/dL — AB (ref 65–99)

## 2015-10-09 LAB — URINE MICROSCOPIC-ADD ON

## 2015-10-09 LAB — PHOSPHORUS: PHOSPHORUS: 4.3 mg/dL (ref 2.5–4.6)

## 2015-10-09 LAB — PREPARE RBC (CROSSMATCH)

## 2015-10-09 LAB — APTT: APTT: 33 s (ref 24–37)

## 2015-10-09 SURGERY — REPAIR, HERNIA, INGUINAL, ADULT
Anesthesia: General | Site: Groin | Laterality: Left

## 2015-10-09 MED ORDER — DIATRIZOATE MEGLUMINE & SODIUM 66-10 % PO SOLN
ORAL | Status: AC
  Filled 2015-10-09: qty 30

## 2015-10-09 MED ORDER — BUPIVACAINE-EPINEPHRINE 0.25% -1:200000 IJ SOLN
INTRAMUSCULAR | Status: DC | PRN
  Administered 2015-10-09: 30 mL

## 2015-10-09 MED ORDER — SODIUM CHLORIDE 0.9% FLUSH
3.0000 mL | Freq: Two times a day (BID) | INTRAVENOUS | Status: DC
Start: 2015-10-09 — End: 2015-10-12
  Administered 2015-10-10 – 2015-10-11 (×4): 3 mL via INTRAVENOUS

## 2015-10-09 MED ORDER — SUCCINYLCHOLINE CHLORIDE 20 MG/ML IJ SOLN
INTRAMUSCULAR | Status: AC
  Filled 2015-10-09: qty 1

## 2015-10-09 MED ORDER — ETOMIDATE 2 MG/ML IV SOLN
INTRAVENOUS | Status: AC
  Filled 2015-10-09: qty 10

## 2015-10-09 MED ORDER — ACETAMINOPHEN 650 MG RE SUPP: 650.0000 mg | Freq: Four times a day (QID) | RECTAL | Status: DC | PRN

## 2015-10-09 MED ORDER — FENTANYL CITRATE (PF) 250 MCG/5ML IJ SOLN
INTRAMUSCULAR | Status: AC
  Filled 2015-10-09: qty 5

## 2015-10-09 MED ORDER — ROCURONIUM BROMIDE 50 MG/5ML IV SOLN
INTRAVENOUS | Status: AC
  Filled 2015-10-09: qty 1

## 2015-10-09 MED ORDER — DEXTROSE-NACL 5-0.45 % IV SOLN
INTRAVENOUS | Status: DC
  Administered 2015-10-09 (×2): via INTRAVENOUS

## 2015-10-09 MED ORDER — ONDANSETRON HCL 4 MG/2ML IJ SOLN
4.0000 mg | Freq: Once | INTRAMUSCULAR | Status: AC
  Administered 2015-10-09: 4 mg via INTRAVENOUS
  Filled 2015-10-09: qty 2

## 2015-10-09 MED ORDER — PROPOFOL 10 MG/ML IV BOLUS
INTRAVENOUS | Status: AC
  Filled 2015-10-09: qty 20

## 2015-10-09 MED ORDER — ONDANSETRON HCL 4 MG/2ML IJ SOLN: 4.0000 mg | Freq: Four times a day (QID) | INTRAMUSCULAR | Status: DC | PRN

## 2015-10-09 MED ORDER — FENTANYL CITRATE (PF) 250 MCG/5ML IJ SOLN
INTRAMUSCULAR | Status: DC | PRN
  Administered 2015-10-09: 25 ug via INTRAVENOUS
  Administered 2015-10-09: 50 ug via INTRAVENOUS

## 2015-10-09 MED ORDER — SUCCINYLCHOLINE CHLORIDE 20 MG/ML IJ SOLN
INTRAMUSCULAR | Status: DC | PRN
  Administered 2015-10-09: 100 mg via INTRAVENOUS

## 2015-10-09 MED ORDER — BUPIVACAINE LIPOSOME 1.3 % IJ SUSP
20.0000 mL | Freq: Once | INTRAMUSCULAR | Status: DC
  Filled 2015-10-09: qty 20

## 2015-10-09 MED ORDER — PHENYLEPHRINE HCL 10 MG/ML IJ SOLN
10.0000 mg | INTRAVENOUS | Status: DC | PRN
  Administered 2015-10-09: 40 ug/min via INTRAVENOUS

## 2015-10-09 MED ORDER — MEPERIDINE HCL 25 MG/ML IJ SOLN: 6.2500 mg | INTRAMUSCULAR | Status: DC | PRN

## 2015-10-09 MED ORDER — ACETAMINOPHEN 325 MG PO TABS: 650.0000 mg | ORAL_TABLET | ORAL | Status: DC | PRN

## 2015-10-09 MED ORDER — FENTANYL CITRATE (PF) 100 MCG/2ML IJ SOLN: 25.0000 ug | INTRAMUSCULAR | Status: DC | PRN

## 2015-10-09 MED ORDER — 0.9 % SODIUM CHLORIDE (POUR BTL) OPTIME
TOPICAL | Status: DC | PRN
  Administered 2015-10-09: 1000 mL

## 2015-10-09 MED ORDER — LACTATED RINGERS IV SOLN: INTRAVENOUS | Status: DC

## 2015-10-09 MED ORDER — ETOMIDATE 2 MG/ML IV SOLN
INTRAVENOUS | Status: DC | PRN
  Administered 2015-10-09: 10 mg via INTRAVENOUS

## 2015-10-09 MED ORDER — ACETAMINOPHEN 325 MG PO TABS: 650.0000 mg | ORAL_TABLET | Freq: Four times a day (QID) | ORAL | Status: DC | PRN

## 2015-10-09 MED ORDER — CEFAZOLIN SODIUM-DEXTROSE 2-4 GM/100ML-% IV SOLN
2.0000 g | INTRAVENOUS | Status: AC
  Administered 2015-10-09: 2 g via INTRAVENOUS
  Filled 2015-10-09: qty 100

## 2015-10-09 MED ORDER — HYDRALAZINE HCL 20 MG/ML IJ SOLN: 5.0000 mg | INTRAMUSCULAR | Status: DC | PRN

## 2015-10-09 MED ORDER — LIDOCAINE HCL (CARDIAC) 20 MG/ML IV SOLN
INTRAVENOUS | Status: DC | PRN
  Administered 2015-10-09: 60 mg via INTRAVENOUS

## 2015-10-09 MED ORDER — ONDANSETRON HCL 4 MG/2ML IJ SOLN
INTRAMUSCULAR | Status: DC | PRN
  Administered 2015-10-09: 4 mg via INTRAVENOUS

## 2015-10-09 MED ORDER — MORPHINE SULFATE (PF) 4 MG/ML IV SOLN
4.0000 mg | Freq: Once | INTRAVENOUS | Status: AC
  Administered 2015-10-09: 2 mg via INTRAVENOUS
  Filled 2015-10-09: qty 1

## 2015-10-09 MED ORDER — SODIUM CHLORIDE 0.9 % IV SOLN
INTRAVENOUS | Status: DC
  Administered 2015-10-09 (×2): via INTRAVENOUS

## 2015-10-09 MED ORDER — ONDANSETRON HCL 4 MG PO TABS
4.0000 mg | ORAL_TABLET | Freq: Four times a day (QID) | ORAL | Status: DC | PRN
Start: 2015-10-09 — End: 2015-10-12

## 2015-10-09 MED ORDER — BUPIVACAINE-EPINEPHRINE (PF) 0.25% -1:200000 IJ SOLN
INTRAMUSCULAR | Status: AC
  Filled 2015-10-09: qty 30

## 2015-10-09 MED ORDER — DEXAMETHASONE SODIUM PHOSPHATE 4 MG/ML IJ SOLN
INTRAMUSCULAR | Status: DC | PRN
  Administered 2015-10-09: 4 mg via INTRAVENOUS

## 2015-10-09 MED ORDER — SODIUM CHLORIDE 0.9 % IV SOLN: Freq: Once | INTRAVENOUS | Status: DC

## 2015-10-09 SURGICAL SUPPLY — 51 items
APL SKNCLS STERI-STRIP NONHPOA (GAUZE/BANDAGES/DRESSINGS) ×1
BENZOIN TINCTURE PRP APPL 2/3 (GAUZE/BANDAGES/DRESSINGS) ×3 IMPLANT
BLADE SURG ROTATE 9660 (MISCELLANEOUS) ×2 IMPLANT
CANISTER SUCTION 2500CC (MISCELLANEOUS) ×2 IMPLANT
CELLS DAT CNTRL 66122 CELL SVR (MISCELLANEOUS) IMPLANT
CHLORAPREP W/TINT 26ML (MISCELLANEOUS) ×3 IMPLANT
CLOSURE WOUND 1/2 X4 (GAUZE/BANDAGES/DRESSINGS) ×1
COVER SURGICAL LIGHT HANDLE (MISCELLANEOUS) ×3 IMPLANT
DECANTER SPIKE VIAL GLASS SM (MISCELLANEOUS) ×2 IMPLANT
DRAIN PENROSE 1/4X12 LTX STRL (WOUND CARE) ×2 IMPLANT
DRAPE LAPAROSCOPIC ABDOMINAL (DRAPES) ×3 IMPLANT
DRSG TEGADERM 4X4.75 (GAUZE/BANDAGES/DRESSINGS) ×2 IMPLANT
ELECT CAUTERY BLADE 6.4 (BLADE) ×3 IMPLANT
ELECT REM PT RETURN 9FT ADLT (ELECTROSURGICAL) ×3
ELECTRODE REM PT RTRN 9FT ADLT (ELECTROSURGICAL) ×1 IMPLANT
GAUZE SPONGE 4X4 12PLY STRL (GAUZE/BANDAGES/DRESSINGS) ×2 IMPLANT
GLOVE BIO SURGEON STRL SZ7 (GLOVE) ×2 IMPLANT
GLOVE BIOGEL PI IND STRL 7.0 (GLOVE) ×1 IMPLANT
GLOVE BIOGEL PI IND STRL 7.5 (GLOVE) IMPLANT
GLOVE BIOGEL PI INDICATOR 7.0 (GLOVE) ×8
GLOVE BIOGEL PI INDICATOR 7.5 (GLOVE) ×2
GLOVE SURG SS PI 6.5 STRL IVOR (GLOVE) ×2 IMPLANT
GLOVE SURG SS PI 7.0 STRL IVOR (GLOVE) ×3 IMPLANT
GOWN STRL REUS W/ TWL LRG LVL3 (GOWN DISPOSABLE) ×2 IMPLANT
GOWN STRL REUS W/TWL LRG LVL3 (GOWN DISPOSABLE) ×9
KIT BASIN OR (CUSTOM PROCEDURE TRAY) ×3 IMPLANT
KIT ROOM TURNOVER OR (KITS) ×3 IMPLANT
MARKER SKIN DUAL TIP RULER LAB (MISCELLANEOUS) ×3 IMPLANT
MESH BARD SOFT 3X6IN (Mesh General) ×2 IMPLANT
NDL HYPO 25GX1X1/2 BEV (NEEDLE) ×1 IMPLANT
NEEDLE HYPO 25GX1X1/2 BEV (NEEDLE) ×6 IMPLANT
NS IRRIG 1000ML POUR BTL (IV SOLUTION) ×3 IMPLANT
PACK GENERAL/GYN (CUSTOM PROCEDURE TRAY) ×3 IMPLANT
PAD ARMBOARD 7.5X6 YLW CONV (MISCELLANEOUS) ×3 IMPLANT
RETRACTOR WND ALEXIS 18 MED (MISCELLANEOUS) IMPLANT
RTRCTR WOUND ALEXIS 18CM MED (MISCELLANEOUS)
RTRCTR WOUND ALEXIS 18CM SML (INSTRUMENTS)
SAVER CELL AAL HAEMONETICS (INSTRUMENTS) IMPLANT
SPONGE GAUZE 4X4 12PLY STER LF (GAUZE/BANDAGES/DRESSINGS) ×3 IMPLANT
STRIP CLOSURE SKIN 1/2X4 (GAUZE/BANDAGES/DRESSINGS) ×2 IMPLANT
SUT MNCRL AB 4-0 PS2 18 (SUTURE) ×3 IMPLANT
SUT PROLENE 0 CT 1 CR/8 (SUTURE) ×3 IMPLANT
SUT PROLENE 2 0 SH DA (SUTURE) ×4 IMPLANT
SUT VIC AB 2-0 SH 27 (SUTURE) ×3
SUT VIC AB 2-0 SH 27X BRD (SUTURE) ×1 IMPLANT
SUT VIC AB 3-0 SH 27 (SUTURE) ×3
SUT VIC AB 3-0 SH 27XBRD (SUTURE) ×1 IMPLANT
SYR CONTROL 10ML LL (SYRINGE) ×3 IMPLANT
TOWEL OR 17X24 6PK STRL BLUE (TOWEL DISPOSABLE) ×3 IMPLANT
TOWEL OR 17X26 10 PK STRL BLUE (TOWEL DISPOSABLE) ×3 IMPLANT
TRAY FOLEY BAG SILVER LF 16FR (CATHETERS) ×2 IMPLANT

## 2015-10-09 NOTE — ED Provider Notes (Signed)
CSN: [id number]     Arrival date & time 10/08/15  2327 History  By signing my name below, I, Helane Gunther, attest that this documentation has been prepared under the direction and in the presence of Jola Schmidt, MD. Electronically Signed: Helane Gunther, ED Scribe. 10/09/2015. 3:54 AM.      Chief Complaint  Patient presents with  . Flank Pain  . Chest Pain   The history is provided by the patient and a relative. No language interpreter was used.   HPI Comments: Isaiah Graham is a 82 y.o. male former smoker with a PMHx of HTN, CKD, aortic stenosis, dilated cardiomyopathy, and AAA who presents to the Emergency Department complaining of flank pain and chest pain onset this afternoon. Pt states he ate a hamburger at 3 PM and began vomiting shortly afterwards. Per daughter, pt has already had a stress test and echocardiogram done, and is scheduled for a heart cath on 4/19. Pt reports associated nausea and lower abdominal pain, which have both resolved. He notes he feels nearly back to his normal self now, with only mild pain in the RLQ. He denies dysuria and diarrhea.   Past Medical History  Diagnosis Date  . Hypertension   . Chronic kidney disease     kidney stones   . Esophageal cancer ([place]) 1990  . Hypothyroidism   . Inguinal hernia February 2013    right  . Esophageal stricture   . Food impaction of esophagus   . Barrett esophagus   . Gastroparesis   . Aortic aneurysm, abdominal ([facility])     repair  . Arthritis   . Aortic stenosis 09/17/2015  . Dilated cardiomyopathy ([place]) 09/17/2015   Past Surgical History  Procedure Laterality Date  . Sp endo repair infraren aaa  8/09    es  . Coarctation of aorta repair  2009  . Inguinal hernia repair  09/01/11    bilateral  . Esophagogastroduodenoscopy  10/05/2011    Procedure: ESOPHAGOGASTRODUODENOSCOPY (EGD);  Surgeon: Ladene Artist, MD,FACG;  Location[facility] ENDOSCOPY;  Service: Endoscopy;  Laterality: N/A;  . Vagotomy    .  Esophagectomy      partial  . Umbilical hernia repair    . Total hip arthroplasty Right 08/06/2012    Procedure: RIGHT TOTAL HIP ARTHOPLASTY WITH ACETABULAR AUTOGRAFT;  Surgeon: Gearlean Alf, MD;  Location: [facility] ORS;  Service: Orthopedics;  Laterality: Right;  RIGHT TOTAL HIP ARTHOPLASTY WITH ACETABULAR AUTOGRAFT    Family History  Problem Relation Age of Onset  . Pancreatic cancer Father   . Aneurysm Daughter     brain  . Glaucoma Sister   . Arthritis/Rheumatoid Mother    Social History  Substance Use Topics  . Smoking status: Former Smoker -- 0.50 packs/day for 22 years    Types: Cigarettes    Quit date: 12/04/1970  . Smokeless tobacco: Never Used  . Alcohol Use: No    Review of Systems A complete 10 system review of systems was obtained and all systems are negative except as noted in the HPI and PMH.   Allergies  Review of patient's allergies indicates no known allergies.  Home Medications   Prior to Admission medications   Medication Sig Start Date End Date Taking? Authorizing Provider  amLODipine (NORVASC) 5 MG tablet Take 1 tablet (5 mg total) by mouth daily. 10/08/15   Sueanne Margarita, MD  aspirin 81 MG tablet Take 81 mg by mouth daily.    Historical Provider, MD  Docusate  Calcium (STOOL SOFTENER PO) Take 2 tablets by mouth daily.    Historical Provider, MD  hydrochlorothiazide (MICROZIDE) 12.5 MG capsule Take 1 capsule (12.5 mg total) by mouth daily. 08/29/15   Eulas Post, MD  levothyroxine (SYNTHROID, LEVOTHROID) 88 MCG tablet Take 1 tablet (88 mcg total) by mouth daily. 09/07/15   Eulas Post, MD  mirtazapine (REMERON) 15 MG tablet TAKE 1 TABLET AT BEDTIME 06/19/15   Eulas Post, MD  Omega-3 Fatty Acids (FISH OIL PO) Take 1 capsule by mouth daily.    Historical Provider, MD  omeprazole (PRILOSEC) 20 MG capsule TAKE 1 CAPSULE DAILY 07/26/15   Eulas Post, MD  tamsulosin (FLOMAX) 0.4 MG CAPS capsule TAKE 1 CAPSULE (0.4 MG TOTAL) BY MOUTH DAILY AFTER  SUPPER. 08/29/15   Eulas Post, MD   BP 138/55 mmHg  Pulse 71  Temp(Src) 98.3 F (36.8 C) (Oral)  Resp 18  Ht 5\' 7"  (1.702 m)  Wt 118 lb (53.524 kg)  BMI 18.48 kg/m2  SpO2 97% Physical Exam  Constitutional: He is oriented to person, place, and time. He appears well-developed and well-nourished.  HENT:  Head: Normocephalic and atraumatic.  Eyes: EOM are normal.  Neck: Normal range of motion.  Cardiovascular: Normal rate, regular rhythm and intact distal pulses.   Murmur heard. Systolic ejection murmur  Pulmonary/Chest: Effort normal and breath sounds normal. No respiratory distress.  Abdominal: Soft. He exhibits no distension. There is tenderness (mild RLQ ). There is no rebound and no guarding.  Musculoskeletal: Normal range of motion.  Neurological: He is alert and oriented to person, place, and time.  Skin: Skin is warm and dry.  Psychiatric: He has a normal mood and affect. Judgment normal.  Nursing note and vitals reviewed.   ED Course  Procedures  DIAGNOSTIC STUDIES: Oxygen Saturation is 97% on RA, adequate by my interpretation.    COORDINATION OF CARE: 3:52 AM - Discussed plans to order something for pain and to order a CT scan. Pt advised of plan for treatment and pt agrees.  Labs Review Labs Reviewed  BASIC METABOLIC PANEL - Abnormal; Notable for the following:    CO2 19 (*)    Glucose, Bld 132 (*)    BUN 56 (*)    Creatinine, Ser 2.31 (*)    Calcium 10.9 (*)    GFR calc non Af Amer 25 (*)    GFR calc Af Amer 28 (*)    All other components within normal limits  CBC - Abnormal; Notable for the following:    Hemoglobin 12.7 (*)    HCT 37.8 (*)    All other components within normal limits  URINALYSIS, ROUTINE W REFLEX MICROSCOPIC (NOT AT [facility]) - Abnormal; Notable for the following:    Color, Urine AMBER (*)    Bilirubin Urine SMALL (*)    Ketones, ur 15 (*)    Protein, ur 30 (*)    All other components within normal limits  URINE MICROSCOPIC-ADD ON -  Abnormal; Notable for the following:    Squamous Epithelial / LPF 0-5 (*)    Bacteria, UA RARE (*)    Casts HYALINE CASTS (*)    All other components within normal limits  I-STAT TROPOININ, ED  I-STAT CG4 LACTIC ACID, ED  TYPE AND SCREEN    Imaging Review Ct Abdomen Pelvis Wo Contrast  10/09/2015  CLINICAL DATA:  Nausea and vomiting with right-sided abdominal pain. EXAM: CT ABDOMEN AND PELVIS WITHOUT CONTRAST TECHNIQUE: Multidetector CT imaging  of the abdomen and pelvis was performed following the standard protocol without IV contrast. COMPARISON:  12/21/2007 CT FINDINGS: Lower chest and abdominal wall: Esophagectomy with gastric pull-through, distended in the setting of obstruction. Small left pleural effusion. Hepatobiliary: No focal liver abnormality.Cholelithiasis without inflammatory or obstructive change. Pancreas: Unremarkable. Spleen: Unremarkable. Adrenals/Urinary Tract: Negative adrenals. No hydronephrosis. Punctate right nephrolithiasis. Smooth bilateral renal atrophy with simple appearing upper pole cyst Unremarkable bladder. Reproductive:Symmetric enlargement of the prostate Stomach/Bowel: Dilated and fluid-filled small bowel leading to a transition point in the pelvis related to a left groin hernia which is favored femoral. The deep inguinal ring appears more cranially positioned. There has been a right inguinal hernia repair; suspect previous left inguinal hernia repair as well. The herniated bowel has peripheral high-density, question mural hemorrhage - the contrast has not reached this point. No definitive signs of bowel necrosis/ perforation. Extensive colonic diverticulosis. No appendicitis. Vascular/Lymphatic: Abdominal aortic aneurysm status post stent repair. The aneurysm sac is decompressed compared to 2009. No mass or adenopathy. Peritoneal: No ascites or pneumoperitoneum. Musculoskeletal: Roughly 33 mm partly calcified mass anterior to the right hip, long-standing and likely  complicated bursitis. Total right hip arthroplasty. Lumbar degenerative disc disease with dextroscoliosis. These results were called by telephone at the time of interpretation on 10/09/2015 at 7:46 am to Dr. Jola Schmidt , who verbally acknowledged these results. IMPRESSION: 1. Incarcerated appearing left groin hernia, favored femoral, with high-grade small bowel obstruction. The wall of the herniated bowel is high density and may be hemorrhagic. 2. Multiple chronic findings compared to 2009, described above. Electronically Signed   By: Monte Fantasia M.D.   On: 10/09/2015 07:45   Dg Chest 2 View  10/09/2015  CLINICAL DATA:  Shortness of breath for 3 weeks. EXAM: CHEST  2 VIEW COMPARISON:  08/14/2012 FINDINGS: Mild cardiac enlargement. No pulmonary vascular congestion. Pulmonary hyperinflation suggesting emphysema. Blunting of the left costophrenic angle suggesting fluid or thickened pleura. No focal consolidation or airspace disease. No pneumothorax. Calcified and tortuous aorta. Surgical clips in the base of the neck. Mild degenerative changes in the spine. IMPRESSION: Fluid or thickened pleura in the left costophrenic angle. Mild cardiac enlargement. Emphysematous changes in the lungs. Electronically Signed   By: Lucienne Capers M.D.   On: 10/09/2015 00:00   I have personally reviewed and evaluated these images and lab results as part of my medical decision-making.   EKG Interpretation   Date/Time:  Monday October 08 2015 23:34:37 EDT Ventricular Rate:  68 PR Interval:  172 QRS Duration: 122 QT Interval:  436 QTC Calculation: 463 R Axis:   55 Text Interpretation:  Normal sinus rhythm Left ventricular hypertrophy  with QRS widening and repolarization abnormality Abnormal ECG nonspecific  ST and T wave changes as compared to prior ecg Confirmed by Maili Shutters  MD,  Lennette Bihari ([id number]) on 10/09/2015 3:53:09 AM Also confirmed by Shirrell Solinger  MD, Kit Brubacher  ([id number]), editor [place]  CCT, [place] ([id number])  on 10/09/2015  7:25:40 AM      MDM   Final diagnoses:  None    There is pain and vomiting CT scan performed which demonstrates small bowel obstruction with incarcerated left inguinal hernia.  Unable to reduce left inguinal hernia bedside.    7:57 AM Spoke with General Surgery who will evaluate the patient at the bedside. Lactate now.  Poor surgical candidate given severe cardiac disease, moderate aortic stenosis, EF of 20% without complete cardiac evaluation to this point.  I personally performed the services described in this  documentation, which was scribed in my presence. The recorded information has been reviewed and is accurate.       Jola Schmidt, MD 10/09/15 [phone]

## 2015-10-09 NOTE — Anesthesia Postprocedure Evaluation (Signed)
Anesthesia Post Note  Patient: Isaiah Graham  Procedure(s) Performed: Procedure(s) (LRB): open femoral hernia repair (Left)  Patient location during evaluation: PACU Anesthesia Type: General Level of consciousness: awake and alert Pain management: pain level controlled Vital Signs Assessment: post-procedure vital signs reviewed and stable Respiratory status: spontaneous breathing, nonlabored ventilation, respiratory function stable and patient connected to nasal cannula oxygen Cardiovascular status: blood pressure returned to baseline and stable Postop Assessment: no signs of nausea or vomiting Anesthetic complications: no    Last Vitals:  Filed Vitals:   10/09/15 1855 10/09/15 1915  BP: 132/60 128/61  Pulse: 66 62  Temp:    Resp: 17 20    Last Pain:  Filed Vitals:   10/09/15 1917  PainSc: Asleep                 Effie Berkshire

## 2015-10-09 NOTE — ED Notes (Signed)
Pt. NG tube advanced approx 13 cm per radiologist recommendation

## 2015-10-09 NOTE — Anesthesia Preprocedure Evaluation (Addendum)
Anesthesia Evaluation  Patient identified by MRN, date of birth, ID band Patient awake    Reviewed: Allergy & Precautions, NPO status , Patient's Chart, lab work & pertinent test results  Airway Mallampati: II  TM Distance: >3 FB Neck ROM: Full    Dental  (+) Dental Advisory Given   Pulmonary former smoker,    breath sounds clear to auscultation       Cardiovascular hypertension, + Peripheral Vascular Disease and +CHF   Rhythm:Regular Rate:Normal     Neuro/Psych negative neurological ROS  negative psych ROS   GI/Hepatic Neg liver ROS, GERD  Medicated,  Endo/Other  Hypothyroidism   Renal/GU CRFRenal disease  negative genitourinary   Musculoskeletal  (+) Arthritis ,   Abdominal (+)  Abdomen: tender.    Peds negative pediatric ROS (+)  Hematology negative hematology ROS (+)   Anesthesia Other Findings   Reproductive/Obstetrics negative OB ROS                            Lab Results  Component Value Date   WBC 9.0 10/08/2015   HGB 12.7* 10/08/2015   HCT 37.8* 10/08/2015   MCV 88.7 10/08/2015   PLT 173 10/08/2015   Lab Results  Component Value Date   CREATININE 2.33* 10/09/2015   BUN 64* 10/09/2015   NA 139 10/09/2015   K 4.6 10/09/2015   CL 108 10/09/2015   CO2 18* 10/09/2015   Lab Results  Component Value Date   INR 1.20 10/09/2015   INR 1.14 10/08/2015   INR 1.03 07/30/2012   09/2015 EKG: normal sinus rhythm, LVH.  08/2015 Echo - Left ventricle: The cavity size was severely dilated. Wall thickness was normal. Systolic function was severely reduced. The estimated ejection fraction was in the range of 20% to 25%. Diffuse hypokinesis. There is akinesis of the inferolateral myocardium. Doppler parameters are consistent with abnormal left ventricular relaxation (grade 1 diastolic dysfunction). Doppler parameters are consistent with high ventricular filling pressure. -  Aortic valve: Valve mobility was mildly restricted. There was moderate stenosis. There was moderate regurgitation. - Mitral valve: Calcified annulus. There was mild regurgitation. - Left atrium: The atrium was severely dilated. - Right atrium: The atrium was mildly dilated. - Pulmonary arteries: Systolic pressure was moderately increased. PA peak pressure: 54 mm Hg (S). - Pericardium, extracardiac: A trivial pericardial effusion was identified. There was a left pleural effusion.     Anesthesia Physical Anesthesia Plan  ASA: III  Anesthesia Plan: General   Post-op Pain Management:    Induction: Intravenous, Rapid sequence and Cricoid pressure planned  Airway Management Planned: Oral ETT  Additional Equipment: Arterial line  Intra-op Plan:   Post-operative Plan: Extubation in OR  Informed Consent: I have reviewed the patients History and Physical, chart, labs and discussed the procedure including the risks, benefits and alternatives for the proposed anesthesia with the patient or authorized representative who has indicated his/her understanding and acceptance.   Dental advisory given  Plan Discussed with: CRNA  Anesthesia Plan Comments:         Anesthesia Quick Evaluation

## 2015-10-09 NOTE — Consult Note (Signed)
Cardiology Consult    Patient ID: Isaiah Graham MRN: [id number], DOB/AGE: 07/25/1929   Admit date: 10/09/2015 Date of Consult: 10/09/2015  Primary Physician: Eulas Post, MD Reason for Consult: Preoperative Clearance Primary Cardiologist: Dr. Radford Pax Requesting Provider: Dr. Kieth Brightly  History of Present Illness    Isaiah Graham is a 82 y.o. male with past medical history of HTN, Stage 4 CKD, Esophageal Cancer (s/p partial esophagectomy in 1997), and AAA (s/p repair in 2009) who was just recently seen in the Cardiology office by Dr. Radford Pax in 08/2015 for worsening fatigue over the past several months. An echocardiogram showed an EF of 20-25% with global HK and inferolateral AK, moderate AS, moderate AR and moderate pulmonary HTN.  With his moderate AS by echo, a dobutamine echo was performed for further workup and consideration of TAVR. The Dobutamine echo showed evidence of severe ischemic cardiomyopathy, increased cardiac output and increased aortic valve gradients confirming the presence of contractile reserve, but 2D imaging was not performed during inotrope administration. Unable to assess viability in the areas of myocardial dysfunction. However, baseline 2D images suggest scarring in the inferior and inferolateral walls, consistent with prior infarction in the right coronary artery and left circumflex artery distribution. With this, it was determined CAD needed to be ruled out and a right/left cardiac catheterization was arranged with Dr. Burt Knack on 10/17/2015 with him to arrive the previous day for hydration.  On 10/08/2015, he presented to the ED reporting nausea and right flank pain since earlier that afternoon. An Abdominal CT scan showed an incarcerated appearing left groin hernia with a high-grade small bowel obstruction. Surgery was consulted and inserted an NG tube. He was unable to have it reduced successfully. Cardiology was consulted for preoperative  clearance.  Labs show a WBC of 9.0. Hgb 12.7. Platelets 173. Creatinine 2.31 (was 1.7 - 2.04 over the past 4 months). Troponin values have been 0.03 and 0.08. EKG shows NSR, HR 68, with LVH and nonspecific ST abnormality in the lateral leads.  The patient reports his dyspnea with exertion and fatigue have been consistent for the past several months. He denies any exertional chest discomfort. His daughter whom is at the bedside reports he has experienced significant weight loss in the recent months.  Past Medical History   Past Medical History  Diagnosis Date  . Hypertension   . Chronic kidney disease     kidney stones   . Esophageal cancer ([place]) 1990  . Hypothyroidism   . Inguinal hernia February 2013    right  . Esophageal stricture   . Food impaction of esophagus   . Barrett esophagus   . Gastroparesis   . Aortic aneurysm, abdominal ([facility])     repair  . Arthritis   . Aortic stenosis 09/17/2015  . Dilated cardiomyopathy ([place]) 09/17/2015    Past Surgical History  Procedure Laterality Date  . Sp endo repair infraren aaa  8/09    es  . Coarctation of aorta repair  2009  . Inguinal hernia repair  09/01/11    bilateral  . Esophagogastroduodenoscopy  10/05/2011    Procedure: ESOPHAGOGASTRODUODENOSCOPY (EGD);  Surgeon: Ladene Artist, MD,FACG;  Location: [facility] ENDOSCOPY;  Service: Endoscopy;  Laterality: N/A;  . Vagotomy    . Esophagectomy      partial 1997  . Umbilical hernia repair    . Total hip arthroplasty Right 08/06/2012    Procedure: RIGHT TOTAL HIP ARTHOPLASTY WITH ACETABULAR AUTOGRAFT;  Surgeon: Gearlean Alf, MD;  Location:  [facility] ORS;  Service: Orthopedics;  Laterality: Right;  RIGHT TOTAL HIP ARTHOPLASTY WITH ACETABULAR AUTOGRAFT      Allergies  No Known Allergies  Inpatient Medications    . diatrizoate meglumine-sodium      . sodium chloride flush  3 mL Intravenous Q12H    Family History    Family History  Problem Relation Age of Onset  . Pancreatic cancer  Father   . Aneurysm Daughter     brain  . Glaucoma Sister   . Arthritis/Rheumatoid Mother     Social History    Social History   Social History  . Marital Status: Married    Spouse Name: N/A  . Number of Children: 5  . Years of Education: N/A   Occupational History  . retired    Social History Main Topics  . Smoking status: Former Smoker -- 0.50 packs/day for 22 years    Types: Cigarettes    Quit date: 12/04/1970  . Smokeless tobacco: Never Used  . Alcohol Use: No  . Drug Use: No  . Sexual Activity: Not on file   Other Topics Concern  . Not on file   Social History Narrative     Review of Systems    General:  No chills, fever, night sweats or weight changes.  Cardiovascular:  No chest pain, edema, orthopnea, palpitations, paroxysmal nocturnal dyspnea. Positive for dyspnea on exertion. Dermatological: No rash, lesions/masses Respiratory: No cough, dyspnea Urologic: No hematuria, dysuria Abdominal:   No diarrhea, bright red blood per rectum, melena, or hematemesis. Positive for nausea, vomiting, and abdominal pain. Neurologic:  No visual changes, wkns, changes in mental status. All other systems reviewed and are otherwise negative except as noted above.  Physical Exam    Blood pressure 128/54, pulse 62, temperature 98.3 F (36.8 C), temperature source Oral, resp. rate 19, height 5\' 7"  (1.702 m), weight 118 lb (53.524 kg), SpO2 95 %.  General: Pleasant, elderly Caucasian male appearing in NAD. Psych: Normal affect. Neuro: Alert and oriented X 3. Moves all extremities spontaneously. HEENT: Normal. NG tube in place.  Neck: Supple without bruits or JVD. Lungs:  Resp regular and unlabored, CTA without wheezing or rales. Heart: RRR no s3, s4, 3/6 SEM best appreciated at RUSB. Abdomen: Soft, non-distended, BS + x 4. Tender along left lower quadrant. Extremities: No clubbing, cyanosis or edema. DP/PT/Radials 2+ and equal bilaterally.  Labs    Troponin [facility] Test)  Recent Labs  10/08/15 2345  TROPIPOC 0.06    Recent Labs  10/09/15 1139  TROPONINI 0.08*   Lab Results  Component Value Date   WBC 9.0 10/08/2015   HGB 12.7* 10/08/2015   HCT 37.8* 10/08/2015   MCV 88.7 10/08/2015   PLT 173 10/08/2015    Recent Labs Lab 10/09/15 1139  NA 139  K 4.6  CL 108  CO2 18*  BUN 64*  CREATININE 2.33*  CALCIUM 10.0  PROT 6.2*  BILITOT 1.2  ALKPHOS 62  ALT 11*  AST 23  GLUCOSE 118*   Lab Results  Component Value Date   CHOL 196 01/31/2013   HDL 46.20 01/31/2013   LDLCALC 131* 01/31/2013   TRIG 94.0 01/31/2013   No results found for: [facility]   Radiology Studies    Ct Abdomen Pelvis Wo Contrast: 10/09/2015  CLINICAL DATA:  Nausea and vomiting with right-sided abdominal pain. EXAM: CT ABDOMEN AND PELVIS WITHOUT CONTRAST TECHNIQUE: Multidetector CT imaging of the abdomen and pelvis was performed following the standard  protocol without IV contrast. COMPARISON:  12/21/2007 CT FINDINGS: Lower chest and abdominal wall: Esophagectomy with gastric pull-through, distended in the setting of obstruction. Small left pleural effusion. Hepatobiliary: No focal liver abnormality.Cholelithiasis without inflammatory or obstructive change. Pancreas: Unremarkable. Spleen: Unremarkable. Adrenals/Urinary Tract: Negative adrenals. No hydronephrosis. Punctate right nephrolithiasis. Smooth bilateral renal atrophy with simple appearing upper pole cyst Unremarkable bladder. Reproductive:Symmetric enlargement of the prostate Stomach/Bowel: Dilated and fluid-filled small bowel leading to a transition point in the pelvis related to a left groin hernia which is favored femoral. The deep inguinal ring appears more cranially positioned. There has been a right inguinal hernia repair; suspect previous left inguinal hernia repair as well. The herniated bowel has peripheral high-density, question mural hemorrhage - the contrast has not reached this point. No definitive  signs of bowel necrosis/ perforation. Extensive colonic diverticulosis. No appendicitis. Vascular/Lymphatic: Abdominal aortic aneurysm status post stent repair. The aneurysm sac is decompressed compared to 2009. No mass or adenopathy. Peritoneal: No ascites or pneumoperitoneum. Musculoskeletal: Roughly 33 mm partly calcified mass anterior to the right hip, long-standing and likely complicated bursitis. Total right hip arthroplasty. Lumbar degenerative disc disease with dextroscoliosis. These results were called by telephone at the time of interpretation on 10/09/2015 at 7:46 am to Dr. Jola Schmidt , who verbally acknowledged these results. IMPRESSION: 1. Incarcerated appearing left groin hernia, favored femoral, with high-grade small bowel obstruction. The wall of the herniated bowel is high density and may be hemorrhagic. 2. Multiple chronic findings compared to 2009, described above. Electronically Signed   By: Monte Fantasia M.D.   On: 10/09/2015 07:45   Dg Chest 2 View: 10/09/2015  CLINICAL DATA:  Shortness of breath for 3 weeks. EXAM: CHEST  2 VIEW COMPARISON:  08/14/2012 FINDINGS: Mild cardiac enlargement. No pulmonary vascular congestion. Pulmonary hyperinflation suggesting emphysema. Blunting of the left costophrenic angle suggesting fluid or thickened pleura. No focal consolidation or airspace disease. No pneumothorax. Calcified and tortuous aorta. Surgical clips in the base of the neck. Mild degenerative changes in the spine. IMPRESSION: Fluid or thickened pleura in the left costophrenic angle. Mild cardiac enlargement. Emphysematous changes in the lungs. Electronically Signed   By: Lucienne Capers M.D.   On: 10/09/2015 00:00   Dg Abd Portable 1v: 10/09/2015  CLINICAL DATA:  NG tube placement EXAM: PORTABLE ABDOMEN - 1 VIEW COMPARISON:  10/09/2015 CT abdomen FINDINGS: There is a nasogastric tube with the tip projecting over the distal esophagus ; recommend advancing the nasogastric tube 13 cm. There are  multiple dilated loops of small bowel consistent with obstruction. There is no evidence of pneumoperitoneum, portal venous gas or pneumatosis. There are no pathologic calcifications along the expected course of the ureters. There is a right hip arthroplasty. There is a dextroscoliosis of the lumbar spine. IMPRESSION: 1. There is a gas tube with the tip projecting over the distal esophagus ; recommend advancing the nasogastric tube 13 cm. 2. Findings consistent with small-bowel obstruction. Electronically Signed   By: Kathreen Devoid   On: 10/09/2015 10:05    EKG & Cardiac Imaging    EKG:  NSR, HR 68, with LVH and nonspecific ST abnormality in the lateral leads.  Echocardiogram: 09/13/2015 Study Conclusions - Left ventricle: The cavity size was severely dilated. Wall  thickness was normal. Systolic function was severely reduced. The  estimated ejection fraction was in the range of 20% to 25%.  Diffuse hypokinesis. There is akinesis of the inferolateral  myocardium. Doppler parameters are consistent with abnormal left  ventricular  relaxation (grade 1 diastolic dysfunction). Doppler  parameters are consistent with high ventricular filling pressure. - Aortic valve: Valve mobility was mildly restricted. There was  moderate stenosis. There was moderate regurgitation. - Mitral valve: Calcified annulus. There was mild regurgitation. - Left atrium: The atrium was severely dilated. - Right atrium: The atrium was mildly dilated. - Pulmonary arteries: Systolic pressure was moderately increased.  PA peak pressure: 54 mm Hg (S). - Pericardium, extracardiac: A trivial pericardial effusion was  identified. There was a left pleural effusion.  Impressions: - Global hypokinesis with akinesis of the inferior lateral wall;  overall severely reduced LV function; grade 1 diastolic  dysfunction; severe LVE; calcified aortic valve with moderate AS  (AVA 1.1-1.2 cm2) and moderate AI; mild MR; severe  LAE; mild RAE;  mild TR with moderately elevated pulmonary pressure; mildly  dilated aortic root (suggest CTA or MRA to further assess).  Assessment & Plan    1. Preoperative Clearance for Incarcerated Left Groin Hernia - patient presented with acute nausea, vomiting, and flank pain and was found to have an incarcerated left groin hernia with high-grade SBO. Was recently diagnosed with new cardiomyopathy by echo showing an EF of 20-25% with global HK and inferolateral AK. He was scheduled for a cardiac catheterization. He reports ongoing dyspnea with exertion for the past few months but denies any chest pain or acute worsening of his dyspnea. EKG does not show any acute changes. - Discussed with Dr. Burt Knack. With known reduced EF, possible ischemic disease, and moderate AS he would be high-risk for the procedure but with his current urgent state, would not pursue further cardiac testing prior to surgery. The patient and his daughter are aware of the risks and are in agreement to proceed with his urgent surgery.  2. Chronic Combined Systolic and Diastolic CHF - echo in [id number] showed an EF of 20-25% with global HK and inferolateral AK, moderate AS, moderate AR and moderate pulmonary HTN. - does not appear to be in an acute exacerbation at this time. - would be conservative with IVF in the setting of upcoming surgery in an effort to avoid volume overload.  3. Moderate Aortic Stenosis - moderate by echo in 08/2015. - the case has previously been discussed with Dr. Burt Knack in regards to potential TAVR.  4. Stage 4 CKD - creatinine currently at 2.33. (baseline 1.7 - 2.1) - continue to monitor.   Signed, Erma Heritage, PA-C 10/09/2015, 1:22 PM Pager: [phone]  Patient seen, examined. Available data reviewed. Agree with findings, assessment, and plan as outlined by Bernerd Pho, PA-C. The patient is independently interviewed and examined. He is an elderly gentleman who presents with  an incarcerated left inguinal hernia. Attempts were made at reduction and he will require surgical repair.  The patient has an extensive cardiac history. He has developed severe LV systolic dysfunction with an ejection fraction of 20-25% in the context of moderate aortic stenosis. There is a question of severe, low gradient aortic stenosis. He was scheduled for right and left heart catheterization to further evaluate next week.  On exam, this is an alert, oriented, cachectic appearing elderly male in no distress. Lungs show diminished air movement bilaterally with prolonged expiration. Heart sounds are distant but regular. There is a grade 3/6 mid peaking harsh systolic murmur best heard at the right upper sternal border. A2 is audible. There is no abdominal tenderness. Abdomen is soft. There is no pretibial edema.  EKG shows sinus rhythm with nonspecific changes.  In  summary, this is an elderly patient with multiple comorbid cardiac and medical conditions including chronic systolic heart failure NYHA functional class III with LVEF 25%, CKD IV, and at least moderate aortic stenosis. I suspect he has ischemic heart disease based on the segmental nature of his LV dysfunction. He is at high risk of cardiac complications related to urgent surgery. However, I do not think there is any practical way to mitigate this risk and the only treatment for his incarcerated hernia is to proceed with surgery. Fortunately, the patient does not have signs of acute heart failure, volume overload, arrhythmia, or symptoms of angina. The patient and his daughter, who is at the bedside, understand the cardiac related-risk of surgery. Will follow closely in the postoperative period with you. Please call if any questions.  Sherren Mocha, M.D. 10/09/2015 2:33 PM

## 2015-10-09 NOTE — H&P (Signed)
[facility] History and Physical  Isaiah Graham [id number] DOB: [birth year] DOA: 10/09/2015  Referring physician: Dr Venora Maples - [facility] PCP: Eulas Post, MD   Chief Complaint: Abd pain   HPI: Isaiah Graham is a 82 y.o. male  Nausea, vomiting, abdominal pain. Started acutely one day ago. Patient not to eat lunch was only able to eat half of his lunch before vomiting. After emesis patient endorsed burning esophageal and chest pain. This was constant and slowly resolved. Patient has been able to keep anything down since initial bout of emesis. No bowel movement since that time. Patient presented to the [facility] ED last night and while waiting in the waiting room his pain slowly resolved without further intervention. Patient unable to delineate exactly where his abdomen he experienced pain denies any fevers, dysuria, frequency, shortness of breath, rash, LOC, neck stiffness, headache.    Review of Systems:  Constitutional:  No , night sweats, Fevers, chills,  HEENT:  No headaches, Difficulty swallowing,Tooth/dental problems,Sore throat, Cardio-vascular:  No Orthopnea, PND, swelling in lower extremities, anasarca, dizziness, palpitations  GI: Per HPI Resp:   No shortness of breath with exertion or at rest. No excess mucus, no productive cough, No non-productive cough, No coughing up of blood.No change in color of mucus.No wheezing.No chest wall deformity  Skin: no rash or lesions.  GU:  no dysuria, change in color of urine, no urgency or frequency. No flank pain.  Musculoskeletal:   No joint pain or swelling. No decreased range of motion. No back pain.  Psych:  No change in mood or affect. No depression or anxiety. No memory loss.  Neuro:  No change in sensation, unilateral strength, or cognitive abilities  All other systems were reviewed and are negative.  Past Medical History  Diagnosis Date  . Hypertension   . Chronic kidney disease     kidney stones     . Esophageal cancer ([place]) 1990  . Hypothyroidism   . Inguinal hernia February 2013    right  . Esophageal stricture   . Food impaction of esophagus   . Barrett esophagus   . Gastroparesis   . Aortic aneurysm, abdominal (HCC)     repair  . Arthritis   . Aortic stenosis 09/17/2015  . Dilated cardiomyopathy ([place]) 09/17/2015   Past Surgical History  Procedure Laterality Date  . Sp endo repair infraren aaa  8/09    es  . Coarctation of aorta repair  2009  . Inguinal hernia repair  09/01/11    bilateral  . Esophagogastroduodenoscopy  10/05/2011    Procedure: ESOPHAGOGASTRODUODENOSCOPY (EGD);  Surgeon: Ladene Artist, MD,FACG;  Location: [facility] ENDOSCOPY;  Service: Endoscopy;  Laterality: N/A;  . Vagotomy    . Esophagectomy      partial 1997  . Umbilical hernia repair    . Total hip arthroplasty Right 08/06/2012    Procedure: RIGHT TOTAL HIP ARTHOPLASTY WITH ACETABULAR AUTOGRAFT;  Surgeon: Gearlean Alf, MD;  Location: [facility] ORS;  Service: Orthopedics;  Laterality: Right;  RIGHT TOTAL HIP ARTHOPLASTY WITH ACETABULAR AUTOGRAFT    Social History:  reports that he quit smoking about 44 years ago. His smoking use included Cigarettes. He has a 11 pack-year smoking history. He has never used smokeless tobacco. He reports that he does not drink alcohol or use illicit drugs.  No Known Allergies  Family History  Problem Relation Age of Onset  . Pancreatic cancer Father   . Aneurysm Daughter     brain  .  Glaucoma Sister   . Arthritis/Rheumatoid Mother      Prior to Admission medications   Medication Sig Start Date End Date Taking? Authorizing Provider  amLODipine (NORVASC) 5 MG tablet Take 1 tablet (5 mg total) by mouth daily. 10/08/15  Yes Sueanne Margarita, MD  aspirin 81 MG tablet Take 81 mg by mouth daily.   Yes Historical Provider, MD  Docusate Calcium (STOOL SOFTENER PO) Take 2 tablets by mouth daily.   Yes Historical Provider, MD  hydrochlorothiazide (MICROZIDE) 12.5 MG capsule Take 1  capsule (12.5 mg total) by mouth daily. 08/29/15  Yes Eulas Post, MD  levothyroxine (SYNTHROID, LEVOTHROID) 88 MCG tablet Take 1 tablet (88 mcg total) by mouth daily. 09/07/15  Yes Eulas Post, MD  mirtazapine (REMERON) 15 MG tablet TAKE 1 TABLET AT BEDTIME 06/19/15  Yes Eulas Post, MD  Omega-3 Fatty Acids (FISH OIL PO) Take 1 capsule by mouth daily.   Yes Historical Provider, MD  omeprazole (PRILOSEC) 20 MG capsule TAKE 1 CAPSULE DAILY 07/26/15  Yes Eulas Post, MD  tamsulosin (FLOMAX) 0.4 MG CAPS capsule TAKE 1 CAPSULE (0.4 MG TOTAL) BY MOUTH DAILY AFTER SUPPER. 08/29/15  Yes Eulas Post, MD   Physical Exam: Filed Vitals:   10/09/15 0915 10/09/15 0930 10/09/15 1000 10/09/15 1030  BP: 133/56 140/64 136/60 136/55  Pulse: 58 66 66 65  Temp:      TempSrc:      Resp: 15 18 19 18   Height:      Weight:      SpO2: 92% 97% 97% 96%    Wt Readings from Last 3 Encounters:  10/08/15 53.524 kg (118 lb)  09/17/15 55.847 kg (123 lb 1.9 oz)  08/29/15 57.516 kg (126 lb 12.8 oz)    General: Elderly, Appears calm and comfortable Eyes:  PERRL, EOMI, normal lids, iris ENT: Dry MM Neck:  no LAD, masses or thyromegaly Cardiovascular:  RRR, III/VI systolic murmur. No LE edema.  Respiratory:  CTA bilaterally, no w/r/r. Normal respiratory effort. Abdomen: Hypoactive BS, non distended, LLQ TTP Skin:  no rash or induration seen on limited exam Musculoskeletal:  grossly normal tone BUE/BLE Psychiatric:  grossly normal mood and affect, speech fluent and appropriate Neurologic:  CN 2-12 grossly intact, moves all extremities in coordinated fashion.          Labs on Admission:  Basic Metabolic Panel:  Recent Labs Lab 10/08/15 1310 10/08/15 2346  NA 141 139  K 4.5 4.5  CL 106 108  CO2 20 19*  GLUCOSE 106* 132*  BUN 49* 56*  CREATININE 2.12* 2.31*  CALCIUM 10.7* 10.9*   Liver Function Tests: No results for input(s): AST, ALT, ALKPHOS, BILITOT, PROT, ALBUMIN in the last  168 hours. No results for input(s): LIPASE, AMYLASE in the last 168 hours. No results for input(s): AMMONIA in the last 168 hours. CBC:  Recent Labs Lab 10/08/15 1310 10/08/15 2346  WBC 7.3 9.0  HGB 12.4* 12.7*  HCT 36.5* 37.8*  MCV 88.4 88.7  PLT 186 173   Cardiac Enzymes: No results for input(s): CKTOTAL, CKMB, CKMBINDEX, TROPONINI in the last 168 hours.  BNP (last 3 results) No results for input(s): BNP in the last 8760 hours.  ProBNP (last 3 results) No results for input(s): PROBNP in the last 8760 hours.   CREATININE: 2.31 mg/dL ABNORMAL (10/08/15 2346) Estimated creatinine clearance - 18.3 mL/min  CBG: No results for input(s): GLUCAP in the last 168 hours.  No results found for:  HGBA1C   Radiological Exams on Admission: Ct Abdomen Pelvis Wo Contrast  10/09/2015  CLINICAL DATA:  Nausea and vomiting with right-sided abdominal pain. EXAM: CT ABDOMEN AND PELVIS WITHOUT CONTRAST TECHNIQUE: Multidetector CT imaging of the abdomen and pelvis was performed following the standard protocol without IV contrast. COMPARISON:  12/21/2007 CT FINDINGS: Lower chest and abdominal wall: Esophagectomy with gastric pull-through, distended in the setting of obstruction. Small left pleural effusion. Hepatobiliary: No focal liver abnormality.Cholelithiasis without inflammatory or obstructive change. Pancreas: Unremarkable. Spleen: Unremarkable. Adrenals/Urinary Tract: Negative adrenals. No hydronephrosis. Punctate right nephrolithiasis. Smooth bilateral renal atrophy with simple appearing upper pole cyst Unremarkable bladder. Reproductive:Symmetric enlargement of the prostate Stomach/Bowel: Dilated and fluid-filled small bowel leading to a transition point in the pelvis related to a left groin hernia which is favored femoral. The deep inguinal ring appears more cranially positioned. There has been a right inguinal hernia repair; suspect previous left inguinal hernia repair as well. The herniated  bowel has peripheral high-density, question mural hemorrhage - the contrast has not reached this point. No definitive signs of bowel necrosis/ perforation. Extensive colonic diverticulosis. No appendicitis. Vascular/Lymphatic: Abdominal aortic aneurysm status post stent repair. The aneurysm sac is decompressed compared to 2009. No mass or adenopathy. Peritoneal: No ascites or pneumoperitoneum. Musculoskeletal: Roughly 33 mm partly calcified mass anterior to the right hip, long-standing and likely complicated bursitis. Total right hip arthroplasty. Lumbar degenerative disc disease with dextroscoliosis. These results were called by telephone at the time of interpretation on 10/09/2015 at 7:46 am to Dr. Jola Schmidt , who verbally acknowledged these results. IMPRESSION: 1. Incarcerated appearing left groin hernia, favored femoral, with high-grade small bowel obstruction. The wall of the herniated bowel is high density and may be hemorrhagic. 2. Multiple chronic findings compared to 2009, described above. Electronically Signed   By: Monte Fantasia M.D.   On: 10/09/2015 07:45   Dg Chest 2 View  10/09/2015  CLINICAL DATA:  Shortness of breath for 3 weeks. EXAM: CHEST  2 VIEW COMPARISON:  08/14/2012 FINDINGS: Mild cardiac enlargement. No pulmonary vascular congestion. Pulmonary hyperinflation suggesting emphysema. Blunting of the left costophrenic angle suggesting fluid or thickened pleura. No focal consolidation or airspace disease. No pneumothorax. Calcified and tortuous aorta. Surgical clips in the base of the neck. Mild degenerative changes in the spine. IMPRESSION: Fluid or thickened pleura in the left costophrenic angle. Mild cardiac enlargement. Emphysematous changes in the lungs. Electronically Signed   By: Lucienne Capers M.D.   On: 10/09/2015 00:00   Dg Abd Portable 1v  10/09/2015  CLINICAL DATA:  NG tube placement EXAM: PORTABLE ABDOMEN - 1 VIEW COMPARISON:  10/09/2015 CT abdomen FINDINGS: There is a  nasogastric tube with the tip projecting over the distal esophagus ; recommend advancing the nasogastric tube 13 cm. There are multiple dilated loops of small bowel consistent with obstruction. There is no evidence of pneumoperitoneum, portal venous gas or pneumatosis. There are no pathologic calcifications along the expected course of the ureters. There is a right hip arthroplasty. There is a dextroscoliosis of the lumbar spine. IMPRESSION: 1. There is a gas tube with the tip projecting over the distal esophagus ; recommend advancing the nasogastric tube 13 cm. 2. Findings consistent with small-bowel obstruction. Electronically Signed   By: Kathreen Devoid   On: 10/09/2015 10:05    Assessment/Plan Principal Problem:   Incarcerated hernia Active Problems:   Hypothyroidism   Essential hypertension   BPH (benign prostatic hyperplasia)   Small bowel obstruction (HCC)   SBO (  small bowel obstruction) (HCC)   Chronic combined systolic and diastolic heart failure (HCC)   Chest pain   History of esophageal cancer   H/O esophagectomy   Hyperglycemia   SBO/bowel incarceration: CT showing incarcerated L femoral hernia w/ high grade SBO and possible hemorrhage. AFVSS, WBC 9, Lactic acid 1.32, Cr 2.31.  Surgery consulted and favoring medical management at this time. Requesting cardiac clearance for surgery given significant risk factors. Pt feels significantly better after placement of NG tube - >750cc of bilious material in canister.  - Stepdown - f/u Surgical recs - f/u Cards recs - DNR - trend lactic acid - continue NG tube to intermittent suction - coags, CMET, mag, Phos - D5 1/2NS 50cc/hr  Chronic CHF: h/o HTN, AS and dilated cardiomyopathy. EF 25% w/ Grade 1 diastolic dysfunction. Pt scheduled for outpt cardiac Cath on 4/20. Cardiologist is Dr. Burt Knack. Pt will need cardiac clearance for possible surgery as above. Discussed case w/ Cards flow manager who will have Dr. Burt Knack see pt. Family aware of  risks of surgery given cardiac history.  - f/u Cards recs - +/- cardiac cath prior to surgery - Tele - Hydralazine PRN (unable to take PO Norvasc, HCTZ) - Daily wts, Strict I/O  CP: likely from esophagitis w/ h/o barrett's esophagitis and esophageal cancer s/p partial esophagectomy. Now resolved. Trop negative.  - Cardiac workup as above  - continue PPI (IV)   Hypothyroid: - contininue synthroid when taking PO - TSH  BPH: enlarging prostate on CT.  - continue flomax when taking PO. Likely to have foley placed for surgical intervention  Hyperglycemia: 132 on admission. No h/o DM. NPO - CBG Q4  Code Status: DNR  DVT Prophylaxis: SCD Family Communication: daughter and son in law Disposition Plan: Pending Improvement    Demyan Fugate Lenna Sciara, MD Family Medicine [facility] [email or web address] Password [email or web address]

## 2015-10-09 NOTE — Op Note (Signed)
Preoperative diagnosis: left femoral hernia with incarceration and obstruction  Postoperative diagnosis: same   Procedure: open left femoral hernia repair with mesh  Surgeon: Gurney Maxin, M.D.  Asst: Verita Lamb, M.D.  Anesthesia: General  Indications for procedure: Isaiah Graham is a 82 y.o. year old male with symptoms of left groin pain, nausea and vomiting.  Description of procedure: The patient was brought into the operative suite. Anesthesia was administered with General endotracheal anesthesia. WHO checklist was applied. The patient was then placed in supine. The area was prepped and draped in the usual sterile fashion.  Next an oblique incision was made just superior to the inguinal ligament. Cautery and blunt dissection were used to dissect down along the external abdominal oblique fashion inferiorly. This allowed [place] to visualize the inguinal ligament and a 3cm portion of bowel herniated through the femoral space. The bowel had some hermorrhagic contusions but no necrosis or pale discoloration. The inguinal ligament was partially cut directly over the hernia allow the small bowel to reduce, prior to full reduction a proximal portion of bowel was inspected which also looked free of necrosis. The small bowel was then placed back into the abdomen.  Next further dissection of the posterior aspect of the hernia showed Cooper's ligament into the lacunar ligament. Next a piece of Bard soft mesh was rolled into a funnel and placed into the femoral space and sutured to Cooper's ligament posteriorly and the inguinal ligament anteriorly with a 2-0 prolene in running fashion in 360 degrees. Care was taken to avoid suturing close to the femoral vessels. Was this was complete the Camper's layer and deep dermal layer were closed with 3-0 vicryl in running fashion. A 4-0 monocryl was used to close the skin in subcuticular fashion. Gauze was put in place for dressing, the patient awoke from  anesthesia went to PACU in stable condition and was watched overnight in the medical ICU due to significant heart disease.  Findings: 3cm femoral hernia  Specimen: none  Implant: Bard soft mesh  Blood loss: <22ml  Local anesthesia: 10 ml 0.25% marcaine with epinephrine  Complications: none  Gurney Maxin, M.D. General, Bariatric, & Minimally Invasive Surgery [facility]

## 2015-10-09 NOTE — Anesthesia Procedure Notes (Signed)
Procedure Name: Intubation Date/Time: 10/09/2015 5:43 PM Performed by: Raphael Gibney T Pre-anesthesia Checklist: Patient identified, Timeout performed, Emergency Drugs available, Suction available and Patient being monitored Patient Re-evaluated:Patient Re-evaluated prior to inductionOxygen Delivery Method: Circle system utilized and Simple face mask Preoxygenation: Pre-oxygenation with 100% oxygen Intubation Type: IV induction, Rapid sequence and Cricoid Pressure applied Ventilation: Mask ventilation without difficulty Laryngoscope Size: Miller and 3 Grade View: Grade I Tube type: Subglottic suction tube Tube size: 7.5 mm Number of attempts: 1 Airway Equipment and Method: Patient positioned with wedge pillow and Stylet Placement Confirmation: ETT inserted through vocal cords under direct vision,  positive ETCO2 and breath sounds checked- equal and bilateral Secured at: 21 cm Tube secured with: Tape Dental Injury: Teeth and Oropharynx as per pre-operative assessment

## 2015-10-09 NOTE — ED Provider Notes (Signed)
9:00 AM Seen and evaluated by the surgery team who agrees with injury treatment at this time.  They're unable to reduce the hernia at the bedside.  They will continue to follow in the hospital.  Given the patient's significant medical comorbidities they're recommending hospitalist admission.  There are he spoke with cardiology who will assist with surgical risk evaluation given his significant cardiac history.  Jola Schmidt, MD 10/09/15 [id number]

## 2015-10-09 NOTE — Consult Note (Signed)
Reason for Consult: incarcerated left inguinal hernia  Referring Physician: Dr. Jola Schmidt     HPI:  Isaiah Graham is a 82 year old male with a history of CKD(stage IV), hypothyroidism, hypertension, esophageal cancer s/p surgery, left inguinal hernia and umbilical hernia repair with mesh by Dr. Deon Pilling in 2012, severe ischemic cardiomyopathy/LV dysfunction, significant AS and AI.  He was scheduled for cardiac catheterization by Dr. Burt Knack next week.   He presented to [facility] overnight with nausea and vomiting.  The patients had [employer] hamburger yesterday after fasting for some blood bloodwork.  He was able to eat about 1/2 sandwich and subsequently started vomiting. He continued to  Vomit throughout the day.  He endorses to minimal pain.  Endorses to chills and sweats.  No flatus, last BM was about 3-4 days ago.  He denies previous symptoms.    ED work up reveals, sCr 2.31.  WBC is normal.  Lactic acid 1.32.  CT of abdomen and pelvis reveals a incarcerated left inguinal hernia with a high grade sbo.  We have therefore been asked to evaluate.   Past Medical History  Diagnosis Date  . Hypertension   . Chronic kidney disease     kidney stones   . Esophageal cancer ([place]) 1990  . Hypothyroidism   . Inguinal hernia February 2013    right  . Esophageal stricture   . Food impaction of esophagus   . Barrett esophagus   . Gastroparesis   . Aortic aneurysm, abdominal (HCC)     repair  . Arthritis   . Aortic stenosis 09/17/2015  . Dilated cardiomyopathy ([place]) 09/17/2015    Past Surgical History  Procedure Laterality Date  . Sp endo repair infraren aaa  8/09    es  . Coarctation of aorta repair  2009  . Inguinal hernia repair  09/01/11    bilateral  . Esophagogastroduodenoscopy  10/05/2011    Procedure: ESOPHAGOGASTRODUODENOSCOPY (EGD);  Surgeon: Ladene Artist, MD,FACG;  Location: [facility] ENDOSCOPY;  Service: Endoscopy;  Laterality: N/A;  . Vagotomy    . Esophagectomy      partial  .  Umbilical hernia repair    . Total hip arthroplasty Right 08/06/2012    Procedure: RIGHT TOTAL HIP ARTHOPLASTY WITH ACETABULAR AUTOGRAFT;  Surgeon: Gearlean Alf, MD;  Location: [facility] ORS;  Service: Orthopedics;  Laterality: Right;  RIGHT TOTAL HIP ARTHOPLASTY WITH ACETABULAR AUTOGRAFT     Family History  Problem Relation Age of Onset  . Pancreatic cancer Father   . Aneurysm Daughter     brain  . Glaucoma Sister   . Arthritis/Rheumatoid Mother     Social History:  reports that he quit smoking about 44 years ago. His smoking use included Cigarettes. He has a 11 pack-year smoking history. He has never used smokeless tobacco. He reports that he does not drink alcohol or use illicit drugs.  Allergies: No Known Allergies  Medications:  Scheduled Meds: . diatrizoate meglumine-sodium       Continuous Infusions:  PRN Meds:.   Results for orders placed or performed during the hospital encounter of 10/09/15 (from the past 48 hour(s))  I-stat troponin, ED     Status: None   Collection Time: 10/08/15 11:45 PM  Result Value Ref Range   Troponin i, poc 0.06 0.00 - 0.08 ng/mL   Comment 3            Comment: Due to the release kinetics of cTnI, a negative result within the first hours of the  onset of symptoms does not rule out myocardial infarction with certainty. If myocardial infarction is still suspected, repeat the test at appropriate intervals.   Basic metabolic panel     Status: Abnormal   Collection Time: 10/08/15 11:46 PM  Result Value Ref Range   Sodium 139 135 - 145 mmol/L   Potassium 4.5 3.5 - 5.1 mmol/L   Chloride 108 101 - 111 mmol/L   CO2 19 (L) 22 - 32 mmol/L   Glucose, Bld 132 (H) 65 - 99 mg/dL   BUN 56 (H) 6 - 20 mg/dL   Creatinine, Ser 2.31 (H) 0.61 - 1.24 mg/dL   Calcium 10.9 (H) 8.9 - 10.3 mg/dL   GFR calc non Af Amer 25 (L) >60 mL/min   GFR calc Af Amer 28 (L) >60 mL/min    Comment: (NOTE) The eGFR has been calculated using the CKD EPI equation. This  calculation has not been validated in all clinical situations. eGFR's persistently <60 mL/min signify possible Chronic Kidney Disease.    Anion gap 12 5 - 15  CBC     Status: Abnormal   Collection Time: 10/08/15 11:46 PM  Result Value Ref Range   WBC 9.0 4.0 - 10.5 K/uL   RBC 4.26 4.22 - 5.81 MIL/uL   Hemoglobin 12.7 (L) 13.0 - 17.0 g/dL   HCT 37.8 (L) 39.0 - 52.0 %   MCV 88.7 78.0 - 100.0 fL   MCH 29.8 26.0 - 34.0 pg   MCHC 33.6 30.0 - 36.0 g/dL   RDW 14.4 11.5 - 15.5 %   Platelets 173 150 - 400 K/uL  Urinalysis, Routine w reflex microscopic (not at [facility] [employer])     Status: Abnormal   Collection Time: 10/09/15  7:15 AM  Result Value Ref Range   Color, Urine AMBER (A) YELLOW    Comment: BIOCHEMICALS MAY BE AFFECTED BY COLOR   APPearance CLEAR CLEAR   Specific Gravity, Urine 1.022 1.005 - 1.030   pH 5.0 5.0 - 8.0   Glucose, UA NEGATIVE NEGATIVE mg/dL   Hgb urine dipstick NEGATIVE NEGATIVE   Bilirubin Urine SMALL (A) NEGATIVE   Ketones, ur 15 (A) NEGATIVE mg/dL   Protein, ur 30 (A) NEGATIVE mg/dL   Nitrite NEGATIVE NEGATIVE   Leukocytes, UA NEGATIVE NEGATIVE  Urine microscopic-add on     Status: Abnormal   Collection Time: 10/09/15  7:15 AM  Result Value Ref Range   Squamous Epithelial / LPF 0-5 (A) NONE SEEN   WBC, UA 0-5 0 - 5 WBC/hpf   RBC / HPF 0-5 0 - 5 RBC/hpf   Bacteria, UA RARE (A) NONE SEEN   Casts HYALINE CASTS (A) NEGATIVE  I-Stat CG4 Lactic Acid, ED     Status: None   Collection Time: 10/09/15  8:21 AM  Result Value Ref Range   Lactic Acid, Venous 1.32 0.5 - 2.0 mmol/L    Ct Abdomen Pelvis Wo Contrast  10/09/2015  CLINICAL DATA:  Nausea and vomiting with right-sided abdominal pain. EXAM: CT ABDOMEN AND PELVIS WITHOUT CONTRAST TECHNIQUE: Multidetector CT imaging of the abdomen and pelvis was performed following the standard protocol without IV contrast. COMPARISON:  12/21/2007 CT FINDINGS: Lower chest and abdominal wall: Esophagectomy with gastric pull-through,  distended in the setting of obstruction. Small left pleural effusion. Hepatobiliary: No focal liver abnormality.Cholelithiasis without inflammatory or obstructive change. Pancreas: Unremarkable. Spleen: Unremarkable. Adrenals/Urinary Tract: Negative adrenals. No hydronephrosis. Punctate right nephrolithiasis. Smooth bilateral renal atrophy with simple appearing upper pole cyst Unremarkable bladder. Reproductive:Symmetric enlargement  of the prostate Stomach/Bowel: Dilated and fluid-filled small bowel leading to a transition point in the pelvis related to a left groin hernia which is favored femoral. The deep inguinal ring appears more cranially positioned. There has been a right inguinal hernia repair; suspect previous left inguinal hernia repair as well. The herniated bowel has peripheral high-density, question mural hemorrhage - the contrast has not reached this point. No definitive signs of bowel necrosis/ perforation. Extensive colonic diverticulosis. No appendicitis. Vascular/Lymphatic: Abdominal aortic aneurysm status post stent repair. The aneurysm sac is decompressed compared to 2009. No mass or adenopathy. Peritoneal: No ascites or pneumoperitoneum. Musculoskeletal: Roughly 33 mm partly calcified mass anterior to the right hip, long-standing and likely complicated bursitis. Total right hip arthroplasty. Lumbar degenerative disc disease with dextroscoliosis. These results were called by telephone at the time of interpretation on 10/09/2015 at 7:46 am to Dr. Jola Schmidt , who verbally acknowledged these results. IMPRESSION: 1. Incarcerated appearing left groin hernia, favored femoral, with high-grade small bowel obstruction. The wall of the herniated bowel is high density and may be hemorrhagic. 2. Multiple chronic findings compared to 2009, described above. Electronically Signed   By: Monte Fantasia M.D.   On: 10/09/2015 07:45   Dg Chest 2 View  10/09/2015  CLINICAL DATA:  Shortness of breath for 3 weeks.  EXAM: CHEST  2 VIEW COMPARISON:  08/14/2012 FINDINGS: Mild cardiac enlargement. No pulmonary vascular congestion. Pulmonary hyperinflation suggesting emphysema. Blunting of the left costophrenic angle suggesting fluid or thickened pleura. No focal consolidation or airspace disease. No pneumothorax. Calcified and tortuous aorta. Surgical clips in the base of the neck. Mild degenerative changes in the spine. IMPRESSION: Fluid or thickened pleura in the left costophrenic angle. Mild cardiac enlargement. Emphysematous changes in the lungs. Electronically Signed   By: Lucienne Capers M.D.   On: 10/09/2015 00:00    Review of Systems  Constitutional: Positive for chills, weight loss and malaise/fatigue. Negative for diaphoresis.  Eyes: Negative for blurred vision, double vision, photophobia, pain, discharge and redness.  Respiratory: Negative for cough, hemoptysis, sputum production, shortness of breath and wheezing.   Cardiovascular: Negative for chest pain, palpitations, orthopnea, claudication, leg swelling and PND.  Gastrointestinal: Positive for nausea, vomiting and abdominal pain. Negative for blood in stool and melena.  Genitourinary: Negative for dysuria, urgency, frequency, hematuria and flank pain.  Neurological: Negative for dizziness, tingling, tremors, sensory change, speech change, focal weakness, seizures, loss of consciousness, weakness and headaches.   Blood pressure 135/59, pulse 63, temperature 98.3 F (36.8 C), temperature source Oral, resp. rate 22, height '5\' 7"'$  (1.702 m), weight 53.524 kg (118 lb), SpO2 95 %. Physical Exam  Constitutional: He is oriented to person, place, and time. He appears well-developed and well-nourished. No distress.  Cardiovascular: Normal rate, regular rhythm, normal heart sounds and intact distal pulses.   4/6 SEM  Respiratory: Effort normal and breath sounds normal. No respiratory distress. He has no wheezes. He has no rales. He exhibits no tenderness.   GI: Soft. Bowel sounds are normal. He exhibits no distension. There is no tenderness. There is no rebound and no guarding.  Incarcerated left femoral hernia, no erythema, mild tenderness, unable to reduce.   Musculoskeletal: Normal range of motion. He exhibits no edema or tenderness.  Neurological: He is alert and oriented to person, place, and time.  Skin: Skin is warm and dry. He is not diaphoretic.  Psychiatric: He has a normal mood and affect. His behavior is normal. Judgment and thought content normal.  Assessment/Plan: Incarcerated left inguinal hernia with obstruction-no evidence of strangulation, lactic acid is normal, WBC is normal.  Will insert NGT to LWIS and bowel rest.  Apply ice and will try to reduce/re-evaluate shortly.  Cardiology consulted for surgical clearance. Recommend medical admission given multiple medical problems.   Tavin Vernet ANP-BC 10/09/2015, 8:30 AM

## 2015-10-09 NOTE — Transfer of Care (Signed)
Immediate Anesthesia Transfer of Care Note  Patient: Isaiah Graham  Procedure(s) Performed: Procedure(s): open femoral hernia repair (Left)  Patient Location: PACU  Anesthesia Type:General  Level of Consciousness: awake and alert   Airway & Oxygen Therapy: Patient Spontanous Breathing and Patient connected to nasal cannula oxygen  Post-op Assessment: Report given to RN and Post -op Vital signs reviewed and stable  Post vital signs: Reviewed and stable  Last Vitals:  Filed Vitals:   10/09/15 1600 10/09/15 1838  BP: 132/69 127/71  Pulse: 62 73  Temp:  36.7 C  Resp: 21 17    Complications: No apparent anesthesia complications

## 2015-10-10 ENCOUNTER — Telehealth: Payer: Self-pay | Admitting: Family Medicine

## 2015-10-10 ENCOUNTER — Encounter (HOSPITAL_COMMUNITY): Payer: Self-pay | Admitting: General Surgery

## 2015-10-10 DIAGNOSIS — K460 Unspecified abdominal hernia with obstruction, without gangrene: Secondary | ICD-10-CM

## 2015-10-10 LAB — CBC
HEMATOCRIT: 30.7 % — AB (ref 39.0–52.0)
Hemoglobin: 10.3 g/dL — ABNORMAL LOW (ref 13.0–17.0)
MCH: 29.9 pg (ref 26.0–34.0)
MCHC: 33.6 g/dL (ref 30.0–36.0)
MCV: 89.2 fL (ref 78.0–100.0)
PLATELETS: 137 10*3/uL — AB (ref 150–400)
RBC: 3.44 MIL/uL — ABNORMAL LOW (ref 4.22–5.81)
RDW: 14.4 % (ref 11.5–15.5)
WBC: 4.5 10*3/uL (ref 4.0–10.5)

## 2015-10-10 LAB — COMPREHENSIVE METABOLIC PANEL
ALT: 10 U/L — ABNORMAL LOW (ref 17–63)
ANION GAP: 8 (ref 5–15)
AST: 22 U/L (ref 15–41)
Albumin: 2.8 g/dL — ABNORMAL LOW (ref 3.5–5.0)
Alkaline Phosphatase: 55 U/L (ref 38–126)
BILIRUBIN TOTAL: 0.8 mg/dL (ref 0.3–1.2)
BUN: 64 mg/dL — ABNORMAL HIGH (ref 6–20)
CHLORIDE: 112 mmol/L — AB (ref 101–111)
CO2: 19 mmol/L — ABNORMAL LOW (ref 22–32)
Calcium: 9.4 mg/dL (ref 8.9–10.3)
Creatinine, Ser: 2.14 mg/dL — ABNORMAL HIGH (ref 0.61–1.24)
GFR calc Af Amer: 31 mL/min — ABNORMAL LOW (ref >60)
GFR, EST NON AFRICAN AMERICAN: 27 mL/min — AB (ref >60)
Glucose, Bld: 133 mg/dL — ABNORMAL HIGH (ref 65–99)
POTASSIUM: 4.5 mmol/L (ref 3.5–5.1)
Sodium: 139 mmol/L (ref 135–145)
TOTAL PROTEIN: 5.7 g/dL — AB (ref 6.5–8.1)

## 2015-10-10 LAB — GLUCOSE, CAPILLARY
GLUCOSE-CAPILLARY: 122 mg/dL — AB (ref 65–99)
GLUCOSE-CAPILLARY: 143 mg/dL — AB (ref 65–99)
GLUCOSE-CAPILLARY: 99 mg/dL (ref 65–99)
Glucose-Capillary: 97 mg/dL (ref 65–99)
Glucose-Capillary: 95 mg/dL (ref 65–99)

## 2015-10-10 MED ORDER — TAMSULOSIN HCL 0.4 MG PO CAPS
0.4000 mg | ORAL_CAPSULE | Freq: Every day | ORAL | Status: DC
  Administered 2015-10-11: 0.4 mg via ORAL
  Filled 2015-10-10 (×2): qty 1

## 2015-10-10 MED ORDER — LEVOTHYROXINE SODIUM 100 MCG IV SOLR
50.0000 ug | Freq: Every day | INTRAVENOUS | Status: DC
  Administered 2015-10-10 – 2015-10-11 (×2): 50 ug via INTRAVENOUS
  Filled 2015-10-10 (×2): qty 5

## 2015-10-10 NOTE — Progress Notes (Signed)
Hospital Problem List     Principal Problem:   Incarcerated hernia Active Problems:   Hypothyroidism   Essential hypertension   BPH (benign prostatic hyperplasia)   Small bowel obstruction (HCC)   SBO (small bowel obstruction) (HCC)   Chronic combined systolic and diastolic heart failure (HCC)   Chest pain   History of esophageal cancer   H/O esophagectomy   Hyperglycemia   Surgery, elective    Patient Profile:   Primary Cardiologist: Dr. Radford Pax  82 y.o. male w/ PMH of HTN, Stage 4 CKD, Esophageal Cancer (s/p partial esophagectomy in 1997), and AAA (s/p repair in 2009) who was just recently seen in the Cardiology office by Dr. Radford Pax in 08/2015 for worsening fatigue over the past several months. An echocardiogram showed an EF of 20-25% with global HK and inferolateral AK, moderate AS, moderate AR and moderate pulmonary HTN. Cards consulted this admission for preoperative clearance for repair of incarcerated hernia.  Subjective   Denies any abdominal pain, chest pain, nausea, vomiting or dyspnea. Sitting up comfortably in a chair.   Inpatient Medications    . levothyroxine  50 mcg Intravenous Daily  . sodium chloride flush  3 mL Intravenous Q12H  . tamsulosin  0.4 mg Oral QPC supper    Vital Signs    Filed Vitals:   10/10/15 0945 10/10/15 1000 10/10/15 1100 10/10/15 1150  BP:      Pulse: 51 54 53   Temp:    98.3 F (36.8 C)  TempSrc:    Oral  Resp: 14 14 14    Height:      Weight:      SpO2: 86% 95% 95%     Intake/Output Summary (Last 24 hours) at 10/10/15 1250 Last data filed at 10/10/15 1100  Gross per 24 hour  Intake    781 ml  Output    685 ml  Net     96 ml   Filed Weights   10/08/15 2333 10/09/15 2100  Weight: 118 lb (53.524 kg) 121 lb 0.5 oz (54.9 kg)    Physical Exam    General: Elderly Caucasian male appearing in no acute distress. Head: Normocephalic, atraumatic. NG tube in place. Neck: Supple without bruits, JVD not elevated. Lungs:   Resp regular and unlabored, CTA without wheezing or rales. Heart: RRR, S1, S2, no S3, S4, 3/6 SEM at RUSB; no rub. Abdomen: Soft, non-tender, non-distended with normoactive bowel sounds. No hepatomegaly. No rebound/guarding. No obvious abdominal masses. Extremities: No clubbing, cyanosis, or edema. Distal pedal pulses are 2+ bilaterally. Neuro: Alert and oriented X 3. Moves all extremities spontaneously. Psych: Normal affect.  Labs    CBC  Recent Labs  10/08/15 2346 10/10/15 0340  WBC 9.0 4.5  HGB 12.7* 10.3*  HCT 37.8* 30.7*  MCV 88.7 89.2  PLT 173 0000000*   Basic Metabolic Panel  Recent Labs  10/09/15 1139 10/10/15 0340  NA 139 139  K 4.6 4.5  CL 108 112*  CO2 18* 19*  GLUCOSE 118* 133*  BUN 64* 64*  CREATININE 2.33* 2.14*  CALCIUM 10.0 9.4  MG 2.1  --   PHOS 4.3  --    Liver Function Tests  Recent Labs  10/09/15 1139 10/10/15 0340  AST 23 22  ALT 11* 10*  ALKPHOS 62 55  BILITOT 1.2 0.8  PROT 6.2* 5.7*  ALBUMIN 3.3* 2.8*   No results for input(s): LIPASE, AMYLASE in the last 72 hours. Cardiac Enzymes  Recent Labs  10/09/15 1139  10/09/15 1404  TROPONINI 0.08* 0.09*   Thyroid Function Tests  Recent Labs  10/09/15 1139  TSH 2.754    Telemetry    Sinus bradycardia with HR in mid-50's - 60's. No atopic events.  ECG    Sinus brady, HR 58 with LVH. Lateral ST abnormality.   Cardiac Studies and Radiology    Ct Abdomen Pelvis Wo Contrast  10/09/2015  CLINICAL DATA:  Nausea and vomiting with right-sided abdominal pain. EXAM: CT ABDOMEN AND PELVIS WITHOUT CONTRAST TECHNIQUE: Multidetector CT imaging of the abdomen and pelvis was performed following the standard protocol without IV contrast. COMPARISON:  12/21/2007 CT FINDINGS: Lower chest and abdominal wall: Esophagectomy with gastric pull-through, distended in the setting of obstruction. Small left pleural effusion. Hepatobiliary: No focal liver abnormality.Cholelithiasis without inflammatory or  obstructive change. Pancreas: Unremarkable. Spleen: Unremarkable. Adrenals/Urinary Tract: Negative adrenals. No hydronephrosis. Punctate right nephrolithiasis. Smooth bilateral renal atrophy with simple appearing upper pole cyst Unremarkable bladder. Reproductive:Symmetric enlargement of the prostate Stomach/Bowel: Dilated and fluid-filled small bowel leading to a transition point in the pelvis related to a left groin hernia which is favored femoral. The deep inguinal ring appears more cranially positioned. There has been a right inguinal hernia repair; suspect previous left inguinal hernia repair as well. The herniated bowel has peripheral high-density, question mural hemorrhage - the contrast has not reached this point. No definitive signs of bowel necrosis/ perforation. Extensive colonic diverticulosis. No appendicitis. Vascular/Lymphatic: Abdominal aortic aneurysm status post stent repair. The aneurysm sac is decompressed compared to 2009. No mass or adenopathy. Peritoneal: No ascites or pneumoperitoneum. Musculoskeletal: Roughly 33 mm partly calcified mass anterior to the right hip, long-standing and likely complicated bursitis. Total right hip arthroplasty. Lumbar degenerative disc disease with dextroscoliosis. These results were called by telephone at the time of interpretation on 10/09/2015 at 7:46 am to Dr. Jola Schmidt , who verbally acknowledged these results. IMPRESSION: 1. Incarcerated appearing left groin hernia, favored femoral, with high-grade small bowel obstruction. The wall of the herniated bowel is high density and may be hemorrhagic. 2. Multiple chronic findings compared to 2009, described above. Electronically Signed   By: Monte Fantasia M.D.   On: 10/09/2015 07:45   Dg Chest 2 View  10/09/2015  CLINICAL DATA:  Shortness of breath for 3 weeks. EXAM: CHEST  2 VIEW COMPARISON:  08/14/2012 FINDINGS: Mild cardiac enlargement. No pulmonary vascular congestion. Pulmonary hyperinflation suggesting  emphysema. Blunting of the left costophrenic angle suggesting fluid or thickened pleura. No focal consolidation or airspace disease. No pneumothorax. Calcified and tortuous aorta. Surgical clips in the base of the neck. Mild degenerative changes in the spine. IMPRESSION: Fluid or thickened pleura in the left costophrenic angle. Mild cardiac enlargement. Emphysematous changes in the lungs. Electronically Signed   By: Lucienne Capers M.D.   On: 10/09/2015 00:00   Dg Abd Portable 1v  10/09/2015  CLINICAL DATA:  NG tube placement EXAM: PORTABLE ABDOMEN - 1 VIEW COMPARISON:  10/09/2015 CT abdomen FINDINGS: There is a nasogastric tube with the tip projecting over the distal esophagus ; recommend advancing the nasogastric tube 13 cm. There are multiple dilated loops of small bowel consistent with obstruction. There is no evidence of pneumoperitoneum, portal venous gas or pneumatosis. There are no pathologic calcifications along the expected course of the ureters. There is a right hip arthroplasty. There is a dextroscoliosis of the lumbar spine. IMPRESSION: 1. There is a gas tube with the tip projecting over the distal esophagus ; recommend advancing the nasogastric tube 13  cm. 2. Findings consistent with small-bowel obstruction. Electronically Signed   By: Kathreen Devoid   On: 10/09/2015 10:05    Assessment & Plan    1. S/p open femoral hernia repair - underwent surgical procedure on 10/09/2015. - HR appears stable on telemetry with no significant arrhythmias. EKG shows no acute ischemic changes. Patient denies any current anginal symptoms.  2. Chronic Combined Systolic and Diastolic CHF - echo in [id number] showed an EF of 20-25% with global HK and inferolateral AK, moderate AS, moderate AR and moderate pulmonary HTN. - does not appear to be in an acute exacerbation at this time. - would be conservative with IVF in the post-operative period in an effort to avoid volume overload. - no BB secondary to  bradycardia.  3. Moderate Aortic Stenosis - moderate by echo in 08/2015. - the case has previously been discussed with Dr. Burt Knack in regards to potential TAVR and he was scheduled for outpatient cardiac cath next week for further workup.  4. Stage 4 CKD - creatinine currently at 2.33. (baseline 1.7 - 2.1). Repeat BMET pending. - continue to monitor.   Signed, Erma Heritage , PA-C 12:50 PM 10/10/2015 Pager: [phone]  Patient seen, examined. Available data reviewed. Agree with findings, assessment, and plan as outlined by Bernerd Pho, PA-C. Pt looks very good POD#1 from left inguinal hernia repair. Exam is stable with clear lung fields, heart RRR with 3/6 harsh systolic murmur at the LSB, and no peripheral edema. Will continue to follow along with you but seems to be stable from a cardiac perspective after surgery.  Sherren Mocha, M.D. 10/10/2015 3:20 PM

## 2015-10-10 NOTE — Telephone Encounter (Signed)
Daughter, Isaiah Graham, wanted you to know that her father did have surgery after being in the ER night before last. It was a hernia repair with no bowel obstruction. He will be in the hospital for a few days.

## 2015-10-10 NOTE — Telephone Encounter (Signed)
[facility] Night - Client [place] Patient Name: Isaiah Graham Gender: Male DOB: Feb 24, 1930 Age: 82 Y 11 M 8 D Return Phone Number: [phone] (Primary) Address: City/State/Zip: [place] [address] Client [facility] Night - Client Client Site [facility] - Night Physician [name] Contact Type Call Who Is Calling Patient / Member / Family / Caregiver Call Type Triage / Clinical Caller Name Kennis Carina Relationship To Patient Daughter Return Phone Number [phone] (Primary) Chief Complaint Paging or Request for Consult Reason for Call Symptomatic / Request for [place] states father wants her to take him to the emergency room. He is being seen for heart valve problems. Has been nauseous and vomiting and gagging and not having an appetite. Wants to let doctor know they are going to hospital. PreDisposition Go to ED Translation No Nurse Assessment Nurse: Harlow Mares, RN, Suanne Marker Date/Time (Eastern Time): 10/08/2015 10:50:44 PM Confirm and document reason for call. If symptomatic, describe symptoms. You must click the next button to save text entered. ---Caller states father wants her to take him to the emergency room. He is being seen for heart valve problems. Has been nauseous and vomiting and gagging and not having an appetite. Wants to let doctor know they are going to hospital. Caller reports that patient is now having pain in his esophagus and pain in his right side. Has the patient traveled out of the country within the last 30 days? ---Not Applicable Does the patient have any new or worsening symptoms? ---Yes Will a triage be completed? ---Yes Related visit to physician within the last 2 weeks? ---Yes Does the PT have any chronic conditions? (i.e. diabetes, asthma, etc.) ---Unknown Is this a behavioral health or  substance abuse call? ---No Guidelines Guideline Title Affirmed Question Affirmed Notes Nurse Date/Time [name] Time) Chest Pain [1] Intermittent chest pain or "angina" AND [2] increasing in severity or frequency (Exception: Harlow Mares, RN, Rhonda 10/08/2015 10:53:14 PM PLEASE NOTE: All timestamps contained within this report are represented as [place] Standard Time. CONFIDENTIALTY NOTICE: This fax transmission is intended only for the addressee. It contains information that is legally privileged, confidential or otherwise protected from use or disclosure. If you are not the intended recipient, you are strictly prohibited from reviewing, disclosing, copying using or disseminating any of this information or taking any action in reliance on or regarding this information. If you have received this fax in error, please notify us immediately by telephone so that we can arrange for its return to [place]. Phone: [phone], Toll-Free: [phone], Fax: [phone] Page: 2 of 2 Call Id: [id number] Guidelines Guideline Title Affirmed Question Affirmed Notes Nurse Date/Time [name] Time) pains lasting a few seconds) Disp. Time [name] Time) Disposition Final User 10/08/2015 10:39:25 PM Attempt made - message left Ferd Glassing 10/08/2015 10:54:08 PM Go to ED Now Yes Harlow Mares, RN, Rosalyn Charters Understands: Yes Disagree/Comply: Comply Care Advice Given Per Guideline GO TO ED NOW: You need to be seen in the Emergency Department. Go to the ER at ___________ [place] now. Drive carefully. DRIVING: Another adult should drive. BRING MEDICINES: * It is also a good idea to bring the pill bottles too. This will help the doctor to make certain you are taking the right medicines and the right dose. NOTHING BY MOUTH: Do not eat or drink anything for now. CALL EMS IF: * Passes out or becomes too weak to stand * You become worse. * Severe  difficulty breathing occurs CARE ADVICE given per Chest Pain  (Adult) guideline. Referrals [facility] - ED

## 2015-10-10 NOTE — Progress Notes (Signed)
[place] TEAM 1 - Stepdown/ICU TEAM PROGRESS NOTE  MATVEI GRASS [id number] DOB: [birth year] DOA: 10/09/2015 PCP: Eulas Post, MD  Admit HPI / Brief Narrative: 82 y.o. male who presented w/ the acute onset of nausea, vomiting, and abdominal pain.  Abdominal CT scan showed an incarcerated appearing left groin hernia with a high-grade small bowel obstruction. Surgery was consulted and was unable to reduce it successfully.  Given his complex h./x, Cardiology was then consulted for operative clearance.  The pt was ultimately taken to the OR on 4/18.  HPI/Subjective: Pt is resting comfortably in bed.  He denies cp, sob, n/v, or current abdom pain.    Assessment/Plan:  L femoral hernia w/ bowel incarceration S/p surgical correction 4/18 - care per Gen Surgery   Chronic combined diastolic and systolic CHF  EF 0000000 w/ global HK - appears euvolemic at present  [facility] Weights   10/08/15 2333 10/09/15 2100  Weight: 53.524 kg (118 lb) 54.9 kg (121 lb 0.5 oz)   Moderate AoS Cards following - was undergoing outpt w/u to consider TAVR  HTN BP currently well controlled  Stage 4 CKD Baseline crt 1.7-2.1 - crt at approx baseline   Hypothyroidism  BPH  Hyperglycemia  Moderate / mild on dextrose - check A1c - likely stress reaction  Esophageal Cancer s/p partial esophagectomy 1997  Code Status: NO CODE BLUE  Family Communication: no family present at time of exam Disposition Plan: stable for transfer to tele bed - PT/OT   Consultants: [facility] Cardiology Gen Surgery   Procedures: 4/18 open femoral hernia repair   Antibiotics: none  DVT prophylaxis: SCDs  Objective: Blood pressure 129/53, pulse 56, temperature 97.7 F (36.5 C), temperature source Oral, resp. rate 13, height 5\' 7"  (1.702 m), weight 54.9 kg (121 lb 0.5 oz), SpO2 95 %.  Intake/Output Summary (Last 24 hours) at 10/10/15 0857 Last data filed at 10/10/15 0800  Gross per 24 hour  Intake    631 ml    Output   1135 ml  Net   -504 ml    Exam: General: No acute respiratory distress Lungs: Clear to auscultation bilaterally without wheezes or crackles Cardiovascular: Regular rate and rhythm with 2/6 holosystolic M Abdomen: Nontender, nondistended, soft, bowel sounds positive, no rebound, no ascites, no appreciable mass Extremities: No significant cyanosis, clubbing, or edema bilateral lower extremities  Data Reviewed:  Basic Metabolic Panel:  Recent Labs Lab 10/08/15 1310 10/08/15 2346 10/09/15 1139 10/10/15 0340  NA 141 139 139 139  K 4.5 4.5 4.6 4.5  CL 106 108 108 112*  CO2 20 19* 18* 19*  GLUCOSE 106* 132* 118* 133*  BUN 49* 56* 64* 64*  CREATININE 2.12* 2.31* 2.33* 2.14*  CALCIUM 10.7* 10.9* 10.0 9.4  MG  --   --  2.1  --   PHOS  --   --  4.3  --     CBC:  Recent Labs Lab 10/08/15 1310 10/08/15 2346 10/10/15 0340  WBC 7.3 9.0 4.5  HGB 12.4* 12.7* 10.3*  HCT 36.5* 37.8* 30.7*  MCV 88.4 88.7 89.2  PLT 186 173 137*    Liver Function Tests:  Recent Labs Lab 10/09/15 1139 10/10/15 0340  AST 23 22  ALT 11* 10*  ALKPHOS 62 55  BILITOT 1.2 0.8  PROT 6.2* 5.7*  ALBUMIN 3.3* 2.8*    Coags:  Recent Labs Lab 10/08/15 1310 10/09/15 1139  INR 1.14 1.20    Recent Labs Lab 10/09/15 1139  APTT 33  Cardiac Enzymes:  Recent Labs Lab 10/09/15 1139 10/09/15 1404  TROPONINI 0.08* 0.09*    CBG:  Recent Labs Lab 10/09/15 1209 10/09/15 2023 10/10/15 0006 10/10/15 0410 10/10/15 0817  GLUCAP 116* 175* 143* 122* 99    Recent Results (from the past 240 hour(s))  MRSA PCR Screening     Status: None   Collection Time: 10/09/15  8:26 PM  Result Value Ref Range Status   MRSA by PCR NEGATIVE NEGATIVE Final    Comment:        The GeneXpert MRSA Assay (FDA approved for NASAL specimens only), is one component of a comprehensive MRSA colonization surveillance program. It is not intended to diagnose MRSA infection nor to guide or monitor  treatment for MRSA infections.      Studies:   Recent x-ray studies have been reviewed in detail by the Attending Physician  Scheduled Meds:  Scheduled Meds: . sodium chloride   Intravenous Once  . sodium chloride flush  3 mL Intravenous Q12H    Time spent on care of this patient: 35 mins   Muneeb Veras T , MD   [facility] Office  [phone] Pager - Text Page per Shea Evans as per below:  On-Call/Text Page:      [email or web address]      password [email or web address]  If 7PM-7AM, please contact night-coverage [email or web address] Password [email or web address] 10/10/2015, 8:57 AM   LOS: 1 day

## 2015-10-10 NOTE — Progress Notes (Signed)
Progress Note: General Surgery Service   Subjective: Pain controlled, voided, no nausea, no flatus  Objective: Vital signs in last 24 hours: Temp:  [97.6 F (36.4 C)-98.7 F (37.1 C)] 97.7 F (36.5 C) (04/19 0819) Pulse Rate:  [54-76] 56 (04/19 0800) Resp:  [9-30] 13 (04/19 0800) BP: (114-140)/(52-71) 129/53 mmHg (04/19 0800) SpO2:  [88 %-98 %] 95 % (04/19 0800) Arterial Line BP: (138-191)/(39-80) 161/49 mmHg (04/19 0800) Weight:  [54.9 kg (121 lb 0.5 oz)] 54.9 kg (121 lb 0.5 oz) (04/18 2100) Last BM Date: 10/08/15  Intake/Output from previous day: 04/18 0701 - 04/19 0700 In: 581 [I.V.:581] Out: 1135 [Emesis/NG output:1125; Blood:10] Intake/Output this shift: Total I/O In: 50 [I.V.:50] Out: -   Abd: soft, mild distension, no guarding  Incisions with dry bandage no swelling.  Extremities: no edema  Neuro: AOx4  Lab Results: CBC   Recent Labs  10/08/15 2346 10/10/15 0340  WBC 9.0 4.5  HGB 12.7* 10.3*  HCT 37.8* 30.7*  PLT 173 137*   BMET  Recent Labs  10/09/15 1139 10/10/15 0340  NA 139 139  K 4.6 4.5  CL 108 112*  CO2 18* 19*  GLUCOSE 118* 133*  BUN 64* 64*  CREATININE 2.33* 2.14*  CALCIUM 10.0 9.4   PT/INR  Recent Labs  10/08/15 1310 10/09/15 1139  LABPROT 14.7 15.4*  INR 1.14 1.20   ABG No results for input(s): PHART, HCO3 in the last 72 hours.  Invalid input(s): PCO2, PO2  Studies/Results:  Anti-infectives: Anti-infectives    Start     Dose/Rate Route Frequency Ordered Stop   10/09/15 1515  ceFAZolin (ANCEF) IVPB 2g/100 mL premix    Comments:  Pharmacy may adjust dosing strength, interval, or rate of medication as needed for optimal therapy for the patient  Send with patient on call to the OR.  Anesthesia to complete antibiotic administration <79min prior to incision per [facility].   2 g 200 mL/hr over 30 Minutes Intravenous On call to O.R. 10/09/15 1500 10/09/15 1750      Medications: Scheduled Meds: . sodium  chloride   Intravenous Once  . sodium chloride flush  3 mL Intravenous Q12H   Continuous Infusions: . sodium chloride 10 mL/hr at 10/09/15 1622  . dextrose 5 % and 0.45% NaCl 50 mL/hr at 10/09/15 2019   PRN Meds:.acetaminophen **OR** acetaminophen, hydrALAZINE, ondansetron **OR** ondansetron (ZOFRAN) IV  Assessment/Plan: Patient Active Problem List   Diagnosis Date Noted  . Small bowel obstruction ([place]) 10/09/2015  . Incarcerated hernia 10/09/2015  . SBO (small bowel obstruction) ([place]) 10/09/2015  . Chronic combined systolic and diastolic heart failure ([place]) 10/09/2015  . Chest pain 10/09/2015  . History of esophageal cancer 10/09/2015  . H/O esophagectomy 10/09/2015  . Hyperglycemia 10/09/2015  . Surgery, elective 10/09/2015  . Aortic stenosis 09/17/2015  . Aortic insufficiency 09/17/2015  . Dilated cardiomyopathy ([place]) 09/17/2015  . CKD (chronic kidney disease) stage 4, GFR 15-29 ml/min (HCC) 11/01/2014  . BPH (benign prostatic hyperplasia) 09/30/2012  . Decubitus ulcer of sacral region, stage 1 08/10/2012  . Postoperative anemia due to acute blood loss 08/09/2012  . Postop Hyponatremia 08/09/2012  . Postop Transfusion 08/09/2012  . OA (osteoarthritis) of hip 08/06/2012  . Osteoarthritis of right hip 07/04/2012  . Stricture and stenosis of esophagus 10/05/2011  . Foreign body in esophagus 10/05/2011  . Other dysphagia 10/05/2011  . Inguinal hernia, right repaired 09/01/2011  08/26/2011  . CONSTIPATION 12/21/2009  . GERD 07/24/2009  . Hypothyroidism 02/19/2009  .  Essential hypertension 02/19/2009  . PERSONAL HISTORY MALIGNANT NEOPLASM ESOPHAGUS 02/19/2009   s/p Procedure(s): open femoral hernia repair 10/09/2015 -continue NG tube -pain control -up to chair today -from surgery standpoint can move to tele floor   LOS: 1 day   Mickeal Skinner, MD Pg# [phone] [facility]

## 2015-10-11 ENCOUNTER — Telehealth: Payer: Self-pay | Admitting: Cardiology

## 2015-10-11 LAB — CBC
HEMATOCRIT: 32.6 % — AB (ref 39.0–52.0)
HEMOGLOBIN: 11 g/dL — AB (ref 13.0–17.0)
MCH: 30.1 pg (ref 26.0–34.0)
MCHC: 33.7 g/dL (ref 30.0–36.0)
MCV: 89.3 fL (ref 78.0–100.0)
Platelets: 132 10*3/uL — ABNORMAL LOW (ref 150–400)
RBC: 3.65 MIL/uL — AB (ref 4.22–5.81)
RDW: 14.1 % (ref 11.5–15.5)
WBC: 6 10*3/uL (ref 4.0–10.5)

## 2015-10-11 LAB — TYPE AND SCREEN
ABO/RH(D): O POS
Antibody Screen: NEGATIVE
UNIT DIVISION: 0
UNIT DIVISION: 0
UNIT DIVISION: 0
Unit division: 0

## 2015-10-11 LAB — BASIC METABOLIC PANEL
ANION GAP: 11 (ref 5–15)
BUN: 57 mg/dL — AB (ref 6–20)
CHLORIDE: 110 mmol/L (ref 101–111)
CO2: 19 mmol/L — ABNORMAL LOW (ref 22–32)
Calcium: 9.5 mg/dL (ref 8.9–10.3)
Creatinine, Ser: 1.89 mg/dL — ABNORMAL HIGH (ref 0.61–1.24)
GFR, EST AFRICAN AMERICAN: 36 mL/min — AB (ref >60)
GFR, EST NON AFRICAN AMERICAN: 31 mL/min — AB (ref >60)
Glucose, Bld: 100 mg/dL — ABNORMAL HIGH (ref 65–99)
POTASSIUM: 3.8 mmol/L (ref 3.5–5.1)
SODIUM: 140 mmol/L (ref 135–145)

## 2015-10-11 MED ORDER — TRAMADOL HCL 50 MG PO TABS
50.0000 mg | ORAL_TABLET | Freq: Four times a day (QID) | ORAL | Status: DC | PRN
  Filled 2015-10-11: qty 1

## 2015-10-11 MED ORDER — ACETAMINOPHEN 500 MG PO TABS: 500.0000 mg | ORAL_TABLET | Freq: Four times a day (QID) | ORAL | Status: DC | PRN

## 2015-10-11 MED ORDER — AMLODIPINE BESYLATE 5 MG PO TABS
5.0000 mg | ORAL_TABLET | Freq: Every day | ORAL | Status: DC
  Administered 2015-10-11 – 2015-10-12 (×2): 5 mg via ORAL
  Filled 2015-10-11: qty 1

## 2015-10-11 MED ORDER — ACETAMINOPHEN 650 MG RE SUPP: 650.0000 mg | Freq: Four times a day (QID) | RECTAL | Status: DC | PRN

## 2015-10-11 MED ORDER — LEVOTHYROXINE SODIUM 88 MCG PO TABS
88.0000 ug | ORAL_TABLET | Freq: Every day | ORAL | Status: DC
  Administered 2015-10-11 – 2015-10-12 (×2): 88 ug via ORAL
  Filled 2015-10-11 (×2): qty 1

## 2015-10-11 MED ORDER — METHOCARBAMOL 500 MG PO TABS: 500.0000 mg | ORAL_TABLET | Freq: Three times a day (TID) | ORAL | Status: DC | PRN

## 2015-10-11 MED ORDER — DOCUSATE SODIUM 100 MG PO CAPS: 100.0000 mg | ORAL_CAPSULE | Freq: Two times a day (BID) | ORAL | Status: DC | PRN

## 2015-10-11 NOTE — Progress Notes (Signed)
[facility] Progress Note  2 Days Post-Op  Subjective: Pt hungry/thirsty.  Passing good flatus, no BM yet.  Mobilizing some.  He tells me hes pending a cath next Wednesday with Dr. Burt Knack for potential TAVR at some point.   Objective: Vital signs in last 24 hours: Temp:  [97.5 F (36.4 C)-98.3 F (36.8 C)] 98 F (36.7 C) (04/20 0549) Pulse Rate:  [51-56] 52 (04/20 0549) Resp:  [14-20] 16 (04/20 0549) BP: (119-141)/(45-55) 141/50 mmHg (04/20 0549) SpO2:  [86 %-100 %] 97 % (04/20 0549) Arterial Line BP: (161)/(47) 161/47 mmHg (04/19 0900) Last BM Date: 10/08/15  Intake/Output from previous day: 04/19 0701 - 04/20 0700 In: 503 [I.V.:503] Out: 900 [Urine:700; Emesis/NG output:200] Intake/Output this shift:    PE: Gen:  Alert, NAD, pleasant Card:  RRR, murmur heard, no gallops or rubs Pulm:  CTA, no W/R/R Abd: Soft, NT/ND, +BS, no HSM, incisions C/D with dry dressing over left groin   Lab Results:   Recent Labs  10/10/15 0340 10/11/15 0515  WBC 4.5 6.0  HGB 10.3* 11.0*  HCT 30.7* 32.6*  PLT 137* 132*   BMET  Recent Labs  10/10/15 0340 10/11/15 0515  NA 139 140  K 4.5 3.8  CL 112* 110  CO2 19* 19*  GLUCOSE 133* 100*  BUN 64* 57*  CREATININE 2.14* 1.89*  CALCIUM 9.4 9.5   PT/INR  Recent Labs  10/08/15 1310 10/09/15 1139  LABPROT 14.7 15.4*  INR 1.14 1.20   CMP     Component Value Date/Time   NA 140 10/11/2015 0515   K 3.8 10/11/2015 0515   CL 110 10/11/2015 0515   CO2 19* 10/11/2015 0515   GLUCOSE 100* 10/11/2015 0515   BUN 57* 10/11/2015 0515   CREATININE 1.89* 10/11/2015 0515   CREATININE 2.12* 10/08/2015 1310   CALCIUM 9.5 10/11/2015 0515   PROT 5.7* 10/10/2015 0340   ALBUMIN 2.8* 10/10/2015 0340   AST 22 10/10/2015 0340   ALT 10* 10/10/2015 0340   ALKPHOS 55 10/10/2015 0340   BILITOT 0.8 10/10/2015 0340   GFRNONAA 31* 10/11/2015 0515   GFRAA 36* 10/11/2015 0515   Lipase  No results found for:  LIPASE     Studies/Results: Dg Abd Portable 1v  10/09/2015  CLINICAL DATA:  NG tube placement EXAM: PORTABLE ABDOMEN - 1 VIEW COMPARISON:  10/09/2015 CT abdomen FINDINGS: There is a nasogastric tube with the tip projecting over the distal esophagus ; recommend advancing the nasogastric tube 13 cm. There are multiple dilated loops of small bowel consistent with obstruction. There is no evidence of pneumoperitoneum, portal venous gas or pneumatosis. There are no pathologic calcifications along the expected course of the ureters. There is a right hip arthroplasty. There is a dextroscoliosis of the lumbar spine. IMPRESSION: 1. There is a gas tube with the tip projecting over the distal esophagus ; recommend advancing the nasogastric tube 13 cm. 2. Findings consistent with small-bowel obstruction. Electronically Signed   By: Kathreen Devoid   On: 10/09/2015 10:05    Anti-infectives: Anti-infectives    Start     Dose/Rate Route Frequency Ordered Stop   10/09/15 1515  ceFAZolin (ANCEF) IVPB 2g/100 mL premix    Comments:  Pharmacy may adjust dosing strength, interval, or rate of medication as needed for optimal therapy for the patient  Send with patient on call to the OR.  Anesthesia to complete antibiotic administration <87min prior to incision per [facility].   2 g 200 mL/hr over 30  Minutes Intravenous On call to O.R. 10/09/15 1500 10/09/15 1750       Assessment/Plan Incarcerated left femoral hernia POD #2 s/p open femoral hernia repair with mesh 10/09/15 - Dr. Kieth Brightly -D/c NG tube, IVF, pain control, antiemetics -Allow clear liquids, oral pain meds, advance diet as tolerated -Resume home meds -Ambulate and IS -SCD's and resume dvt proph per primary service - okay for heparin or lovenox -Hopefully d/c home tomorrow if tolerating a regular diet  Acute on chronic renal failure - Cr. Improved to 1.89   LOS: 2 days    Nat Christen 10/11/2015, 8:27 AM Pager: [phone]  (7am - 4:30pm M-F;  7am - 11:30am Sa/Su)

## 2015-10-11 NOTE — Progress Notes (Signed)
[place] TEAM 1 - Stepdown/ICU TEAM PROGRESS NOTE  Isaiah Graham [id number] DOB: 14-Sep-1929 DOA: 10/09/2015 PCP: Eulas Post, MD  Admit HPI / Brief Narrative: 82 y.o. male who presented w/ the acute onset of nausea, vomiting, and abdominal pain.  Abdominal CT scan showed an incarcerated appearing left groin hernia with a high-grade small bowel obstruction. Surgery was consulted and was unable to reduce it successfully.  Given his complex h/x, Cardiology was then consulted for operative clearance.  The pt was ultimately taken to the OR on 4/18.  HPI/Subjective: Pt is resting comfortably in bedside chair.  He is tolerating clear liquids w/o any difficulty.  He denies cp, sob, n/v, or current abdom pain.  He is quiet pleasant, and tells me about his 24 years serving in [employer].    Assessment/Plan:  L femoral hernia w/ bowel incarceration S/p surgical correction 4/18 - care per Gen Surgery - currently tolerating clear liquids - potential to be cleared for d/c by surgery in AM   Chronic combined diastolic and systolic CHF  EF 0000000 w/ global HK - appears euvolemic at present - no weight registered today  Filed Weights   10/08/15 2333 10/09/15 2100  Weight: 53.524 kg (118 lb) 54.9 kg (121 lb 0.5 oz)   Moderate AoS Cards following - was undergoing outpt w/u to consider TAVR - currently scheduled for L & R heart cath next Wednesday   HTN BP reasonably controlled  Stage 4 CKD Baseline crt 1.7-2.1 - crt at approx baseline   Recent Labs Lab 10/08/15 1310 10/08/15 2346 10/09/15 1139 10/10/15 0340 10/11/15 0515  CREATININE 2.12* 2.31* 2.33* 2.14* 1.89*   Hypothyroidism  BPH  Hyperglycemia  Moderate / mild on dextrose - A1c pending   Esophageal Cancer s/p partial esophagectomy 1997  Code Status: NO CODE BLUE  Family Communication: no family present at time of exam Disposition Plan: tele bed - PT/OT - possible d/c home in AM    Consultants: [facility] Cardiology Gen Surgery   Procedures: 4/18 open femoral hernia repair   Antibiotics: none  DVT prophylaxis: SCDs  Objective: Blood pressure 140/46, pulse 68, temperature 98.2 F (36.8 C), temperature source Oral, resp. rate 18, height 5\' 7"  (1.702 m), weight 54.9 kg (121 lb 0.5 oz), SpO2 98 %.  Intake/Output Summary (Last 24 hours) at 10/11/15 1427 Last data filed at 10/11/15 1330  Gross per 24 hour  Intake    513 ml  Output    700 ml  Net   -187 ml    Exam: General: No acute respiratory distress Lungs: Clear to auscultation bilaterally without wheezes Cardiovascular: Regular rate and rhythm - 2/6 holosystolic M Abdomen: Nondistended, soft, bowel sounds positive, no rebound Extremities: No significant cyanosis, clubbing, edema bilateral lower extremities  Data Reviewed:  Basic Metabolic Panel:  Recent Labs Lab 10/08/15 1310 10/08/15 2346 10/09/15 1139 10/10/15 0340 10/11/15 0515  NA 141 139 139 139 140  K 4.5 4.5 4.6 4.5 3.8  CL 106 108 108 112* 110  CO2 20 19* 18* 19* 19*  GLUCOSE 106* 132* 118* 133* 100*  BUN 49* 56* 64* 64* 57*  CREATININE 2.12* 2.31* 2.33* 2.14* 1.89*  CALCIUM 10.7* 10.9* 10.0 9.4 9.5  MG  --   --  2.1  --   --   PHOS  --   --  4.3  --   --     CBC:  Recent Labs Lab 10/08/15 1310 10/08/15 2346 10/10/15 0340  10/11/15 0515  WBC 7.3 9.0 4.5 6.0  HGB 12.4* 12.7* 10.3* 11.0*  HCT 36.5* 37.8* 30.7* 32.6*  MCV 88.4 88.7 89.2 89.3  PLT 186 173 137* 132*    Liver Function Tests:  Recent Labs Lab 10/09/15 1139 10/10/15 0340  AST 23 22  ALT 11* 10*  ALKPHOS 62 55  BILITOT 1.2 0.8  PROT 6.2* 5.7*  ALBUMIN 3.3* 2.8*    Coags:  Recent Labs Lab 10/08/15 1310 10/09/15 1139  INR 1.14 1.20    Recent Labs Lab 10/09/15 1139  APTT 33    Cardiac Enzymes:  Recent Labs Lab 10/09/15 1139 10/09/15 1404  TROPONINI 0.08* 0.09*    CBG:  Recent Labs Lab 10/10/15 0006 10/10/15 0410  10/10/15 0817 10/10/15 1144 10/10/15 1538  GLUCAP 143* 122* 99 97 95    Recent Results (from the past 240 hour(s))  MRSA PCR Screening     Status: None   Collection Time: 10/09/15  8:26 PM  Result Value Ref Range Status   MRSA by PCR NEGATIVE NEGATIVE Final    Comment:        The GeneXpert MRSA Assay (FDA approved for NASAL specimens only), is one component of a comprehensive MRSA colonization surveillance program. It is not intended to diagnose MRSA infection nor to guide or monitor treatment for MRSA infections.      Studies:   Recent x-ray studies have been reviewed in detail by the Attending Physician  Scheduled Meds:  Scheduled Meds: . levothyroxine  50 mcg Intravenous Daily  . sodium chloride flush  3 mL Intravenous Q12H  . tamsulosin  0.4 mg Oral QPC supper    Time spent on care of this patient: 35 mins   MCCLUNG,JEFFREY T , MD   [facility] Office  [phone] Pager - Text Page per Shea Evans as per below:  On-Call/Text Page:      [email or web address]      password [email or web address]  If 7PM-7AM, please contact night-coverage [email or web address] Password [email or web address] 10/11/2015, 2:27 PM   LOS: 2 days

## 2015-10-11 NOTE — Discharge Instructions (Signed)
[facility]      [facility] [phone]  OPEN ABDOMINAL SURGERY: POST OP INSTRUCTIONS  Always review your discharge instruction sheet given to you by the facility where your surgery was performed.  IF YOU HAVE DISABILITY OR FAMILY LEAVE FORMS, YOU MUST BRING THEM TO THE OFFICE FOR PROCESSING.  PLEASE DO NOT GIVE THEM TO YOUR DOCTOR.  1. A prescription for pain medication may be given to you upon discharge.  Take your pain medication as prescribed, if needed.  If narcotic pain medicine is not needed, then you may take acetaminophen (Tylenol) or ibuprofen (Advil) as needed. 2. Take your usually prescribed medications unless otherwise directed. 3. If you need a refill on your pain medication, please contact your pharmacy. They will contact our office to request authorization.  Prescriptions will not be filled after 5pm or on week-ends. 4. You should follow a light diet the first few days after arrival home, such as soup and crackers, pudding, etc.unless your doctor has advised otherwise. A high-fiber, low fat diet can be resumed as tolerated.   Be sure to include lots of fluids daily. Most patients will experience some swelling and bruising on the chest and neck area.  Ice packs will help.  Swelling and bruising can take several days to resolve 5. Most patients will experience some swelling and bruising in the area of the incision. Ice pack will help. Swelling and bruising can take several days to resolve..  6. It is common to experience some constipation if taking pain medication after surgery.  Increasing fluid intake and taking a stool softener will usually help or prevent this problem from occurring.  A mild laxative (Milk of Magnesia or Miralax) should be taken according to package directions if there are no bowel movements after 48 hours. 7.  You may have steri-strips (small skin tapes) in place directly over the incision.  These strips should be left on the skin for 7-10 days.  If your  surgeon used skin glue on the incision, you may shower in 24 hours.  The glue will flake off over the next 2-3 weeks.  Any sutures or staples will be removed at the office during your follow-up visit. You may find that a light gauze bandage over your incision may keep your staples from being rubbed or pulled. You may shower and replace the bandage daily. 8. ACTIVITIES:  You may resume regular (light) daily activities beginning the next day--such as daily self-care, walking, climbing stairs--gradually increasing activities as tolerated.  You may have sexual intercourse when it is comfortable.  Refrain from any heavy lifting or straining until approved by your doctor. a. You may drive when you no longer are taking prescription pain medication, you can comfortably wear a seatbelt, and you can safely maneuver your car and apply brakes b. Return to Work: ___________________________________ 9. You should see your doctor in the office for a follow-up appointment approximately two weeks after your surgery.  Make sure that you call for this appointment within a day or two after you arrive home to insure a convenient appointment time. OTHER INSTRUCTIONS:  _____________________________________________________________ _____________________________________________________________  WHEN TO CALL YOUR DOCTOR: 1. Fever over 101.0 2. Inability to urinate 3. Nausea and/or vomiting 4. Extreme swelling or bruising 5. Continued bleeding from incision. 6. Increased pain, redness, or drainage from the incision. 7. Difficulty swallowing or breathing 8. Muscle cramping or spasms. 9. Numbness or tingling in hands or feet or around lips.  The clinic staff is available to   answer your questions during regular business hours.  Please don't hesitate to call and ask to speak to one of the nurses if you have concerns.  For further questions, please visit [email or web address]   

## 2015-10-11 NOTE — Telephone Encounter (Signed)
New Message  Daughter called request a call back to discuss if the procedure on the 26th of April is still on If not why and will it be resch? Please call

## 2015-10-11 NOTE — Progress Notes (Signed)
NG D/C'd per MD order, pt tolerating clear fluids

## 2015-10-11 NOTE — Progress Notes (Signed)
Hospital Problem List     Principal Problem:   Incarcerated hernia Active Problems:   Hypothyroidism   Essential hypertension   BPH (benign prostatic hyperplasia)   Small bowel obstruction (HCC)   SBO (small bowel obstruction) (HCC)   Chronic combined systolic and diastolic heart failure (HCC)   Chest pain   History of esophageal cancer   H/O esophagectomy   Hyperglycemia   Surgery, elective    Patient Profile:   Primary Cardiologist: Dr. Radford Pax  82 y.o. male w/ PMH of HTN, Stage 4 CKD, Esophageal Cancer (s/p partial esophagectomy in 1997), and AAA (s/p repair in 2009) who was just recently seen in the Cardiology office by Dr. Radford Pax in 08/2015 for worsening fatigue over the past several months. An echocardiogram showed an EF of 20-25% with global HK and inferolateral AK, moderate AS, moderate AR and moderate pulmonary HTN. Cards consulted this admission for preoperative clearance for repair of incarcerated hernia.  Subjective   Denies any chest pain or abdominal pain. Wants his NG tube out so he can consume food. Reports having flatulence overnight and this AM.  Inpatient Medications    . levothyroxine  50 mcg Intravenous Daily  . sodium chloride flush  3 mL Intravenous Q12H  . tamsulosin  0.4 mg Oral QPC supper    Vital Signs    Filed Vitals:   10/10/15 1150 10/10/15 1812 10/10/15 2100 10/11/15 0549  BP: 126/55 133/45 131/52 141/50  Pulse: 56 55 55 52  Temp: 98.3 F (36.8 C) 97.5 F (36.4 C) 97.9 F (36.6 C) 98 F (36.7 C)  TempSrc: Oral  Axillary   Resp: 20 19 15 16   Height:      Weight:      SpO2: 94% 96% 100% 97%    Intake/Output Summary (Last 24 hours) at 10/11/15 0820 Last data filed at 10/11/15 [id number]  Gross per 24 hour  Intake    453 ml  Output    900 ml  Net   -447 ml   Filed Weights   10/08/15 2333 10/09/15 2100  Weight: 118 lb (53.524 kg) 121 lb 0.5 oz (54.9 kg)    Physical Exam    General: Elderly Caucasian male appearing in no acute  distress. Head: Normocephalic, atraumatic. NG tube in place. Neck: Supple without bruits, JVD not elevated. Lungs:  Resp regular and unlabored, CTA without wheezing or rales. Heart: RRR, S1, S2, no S3, S4, 3/6 SEM at RUSB; no rub. Abdomen: Soft, non-tender, non-distended with normoactive bowel sounds. No hepatomegaly. No rebound/guarding. No obvious abdominal masses. Extremities: No clubbing, cyanosis, or edema. Distal pedal pulses are 2+ bilaterally. Neuro: Alert and oriented X 3. Moves all extremities spontaneously. Psych: Normal affect.  Labs    CBC  Recent Labs  10/10/15 0340 10/11/15 0515  WBC 4.5 6.0  HGB 10.3* 11.0*  HCT 30.7* 32.6*  MCV 89.2 89.3  PLT 137* Q000111Q*   Basic Metabolic Panel  Recent Labs  10/09/15 1139 10/10/15 0340 10/11/15 0515  NA 139 139 140  K 4.6 4.5 3.8  CL 108 112* 110  CO2 18* 19* 19*  GLUCOSE 118* 133* 100*  BUN 64* 64* 57*  CREATININE 2.33* 2.14* 1.89*  CALCIUM 10.0 9.4 9.5  MG 2.1  --   --   PHOS 4.3  --   --    Liver Function Tests  Recent Labs  10/09/15 1139 10/10/15 0340  AST 23 22  ALT 11* 10*  ALKPHOS 62 55  BILITOT 1.2  0.8  PROT 6.2* 5.7*  ALBUMIN 3.3* 2.8*   No results for input(s): LIPASE, AMYLASE in the last 72 hours. Cardiac Enzymes  Recent Labs  10/09/15 1139 10/09/15 1404  TROPONINI 0.08* 0.09*   Thyroid Function Tests  Recent Labs  10/09/15 1139  TSH 2.754    Telemetry    Sinus bradycardia with HR in mid-50's. No atopic events.  ECG    No new tracings.   Cardiac Studies and Radiology    Ct Abdomen Pelvis Wo Contrast  10/09/2015  CLINICAL DATA:  Nausea and vomiting with right-sided abdominal pain. EXAM: CT ABDOMEN AND PELVIS WITHOUT CONTRAST TECHNIQUE: Multidetector CT imaging of the abdomen and pelvis was performed following the standard protocol without IV contrast. COMPARISON:  12/21/2007 CT FINDINGS: Lower chest and abdominal wall: Esophagectomy with gastric pull-through, distended in the  setting of obstruction. Small left pleural effusion. Hepatobiliary: No focal liver abnormality.Cholelithiasis without inflammatory or obstructive change. Pancreas: Unremarkable. Spleen: Unremarkable. Adrenals/Urinary Tract: Negative adrenals. No hydronephrosis. Punctate right nephrolithiasis. Smooth bilateral renal atrophy with simple appearing upper pole cyst Unremarkable bladder. Reproductive:Symmetric enlargement of the prostate Stomach/Bowel: Dilated and fluid-filled small bowel leading to a transition point in the pelvis related to a left groin hernia which is favored femoral. The deep inguinal ring appears more cranially positioned. There has been a right inguinal hernia repair; suspect previous left inguinal hernia repair as well. The herniated bowel has peripheral high-density, question mural hemorrhage - the contrast has not reached this point. No definitive signs of bowel necrosis/ perforation. Extensive colonic diverticulosis. No appendicitis. Vascular/Lymphatic: Abdominal aortic aneurysm status post stent repair. The aneurysm sac is decompressed compared to 2009. No mass or adenopathy. Peritoneal: No ascites or pneumoperitoneum. Musculoskeletal: Roughly 33 mm partly calcified mass anterior to the right hip, long-standing and likely complicated bursitis. Total right hip arthroplasty. Lumbar degenerative disc disease with dextroscoliosis. These results were called by telephone at the time of interpretation on 10/09/2015 at 7:46 am to Dr. Jola Schmidt , who verbally acknowledged these results. IMPRESSION: 1. Incarcerated appearing left groin hernia, favored femoral, with high-grade small bowel obstruction. The wall of the herniated bowel is high density and may be hemorrhagic. 2. Multiple chronic findings compared to 2009, described above. Electronically Signed   By: Monte Fantasia M.D.   On: 10/09/2015 07:45   Dg Chest 2 View  10/09/2015  CLINICAL DATA:  Shortness of breath for 3 weeks. EXAM: CHEST  2  VIEW COMPARISON:  08/14/2012 FINDINGS: Mild cardiac enlargement. No pulmonary vascular congestion. Pulmonary hyperinflation suggesting emphysema. Blunting of the left costophrenic angle suggesting fluid or thickened pleura. No focal consolidation or airspace disease. No pneumothorax. Calcified and tortuous aorta. Surgical clips in the base of the neck. Mild degenerative changes in the spine. IMPRESSION: Fluid or thickened pleura in the left costophrenic angle. Mild cardiac enlargement. Emphysematous changes in the lungs. Electronically Signed   By: Lucienne Capers M.D.   On: 10/09/2015 00:00   Dg Abd Portable 1v  10/09/2015  CLINICAL DATA:  NG tube placement EXAM: PORTABLE ABDOMEN - 1 VIEW COMPARISON:  10/09/2015 CT abdomen FINDINGS: There is a nasogastric tube with the tip projecting over the distal esophagus ; recommend advancing the nasogastric tube 13 cm. There are multiple dilated loops of small bowel consistent with obstruction. There is no evidence of pneumoperitoneum, portal venous gas or pneumatosis. There are no pathologic calcifications along the expected course of the ureters. There is a right hip arthroplasty. There is a dextroscoliosis of the lumbar spine.  IMPRESSION: 1. There is a gas tube with the tip projecting over the distal esophagus ; recommend advancing the nasogastric tube 13 cm. 2. Findings consistent with small-bowel obstruction. Electronically Signed   By: Kathreen Devoid   On: 10/09/2015 10:05    Assessment & Plan    1. S/p open femoral hernia repair - underwent surgical procedure on 10/09/2015. Post-op Day #2. - HR appears stable on telemetry with no significant arrhythmias. EKG shows no acute ischemic changes. Patient denies any current anginal symptoms.  2. Chronic Combined Systolic and Diastolic CHF - echo in [id number] showed an EF of 20-25% with global HK and inferolateral AK, moderate AS, moderate AR and moderate pulmonary HTN. - does not appear to be in an acute  exacerbation at this time. Not fluid overloaded. Net -951 mL this admission. - no BB secondary to bradycardia.  3. Moderate Aortic Stenosis - moderate by echo in 08/2015. - the case has previously been discussed with Dr. Burt Knack in regards to potential TAVR and he was scheduled for outpatient cardiac cath next week for further workup. Remains on the schedule for 10/17/2015.  4. Stage 4 CKD - creatinine currently improved to 1.89 (baseline 1.7 - 2.1).  - continue to monitor.   Arna Medici , PA-C 8:20 AM 10/11/2015 Pager: [phone]  Patient seen, examined. Available data reviewed. Agree with findings, assessment, and plan as outlined by Bernerd Pho, PA-C. The patient is independently interviewed and examined. Heart is regular with a 3/6 harsh systolic murmur at the left sternal border.There is a soft 2/6 diastolic decrescendo murmur at the apex. Lung fields are clear. He is recovering well from surgery. We talked about timing of his elective right and left heart catheterization, currently scheduled for next Wednesday. If he is discharged home tomorrow and doing well early next week, I think it is okay to continue as planned. If he needs more time to recover from surgery, his cardiac catheterization can be rescheduled in a few weeks. I will check on him again in the morning.  Sherren Mocha, M.D. 10/11/2015 11:31 AM

## 2015-10-11 NOTE — Care Management Note (Addendum)
Case Management Note  Patient Details  Name: Isaiah Graham MRN: [id number] Date of Birth: Apr 01, 1930  Subjective/Objective:                 Admitted with abd pain, s/p open left femoral hernia repair with mesh 10/10/2015. Resides with wife. Independent with ADL's PTA. No DME Usage. PCP: Dr.Burchette.  Action/Plan: Return to home when medically stable. CM to f/u with disposition needs.  Expected Discharge Date:                  Expected Discharge Plan:  [place]  In-House Referral:     Discharge planning Services  CM Consult  Post Acute Care Choice:    Choice offered to:     DME Arranged:    DME Agency:     HH Arranged:    [place] Agency:     Status of Service:  In process, will continue to follow  Medicare Important Message Given:    Date Medicare IM Given:    Medicare IM give by:    Date Additional Medicare IM Given:    Additional Medicare Important Message give by:     If discussed at [place] of Stay Meetings, dates discussed:    Additional Comments: Kennis Carina (Daughter) [phone]  Whitman Hero [place], [place] [phone] 10/11/2015, 1:25 PM

## 2015-10-11 NOTE — Telephone Encounter (Signed)
Left message to call back  

## 2015-10-12 ENCOUNTER — Other Ambulatory Visit: Payer: Self-pay | Admitting: *Deleted

## 2015-10-12 DIAGNOSIS — L8990 Pressure ulcer of unspecified site, unspecified stage: Secondary | ICD-10-CM

## 2015-10-12 LAB — CBC
HEMATOCRIT: 31.2 % — AB (ref 39.0–52.0)
HEMOGLOBIN: 10.5 g/dL — AB (ref 13.0–17.0)
MCH: 29.6 pg (ref 26.0–34.0)
MCHC: 33.7 g/dL (ref 30.0–36.0)
MCV: 87.9 fL (ref 78.0–100.0)
Platelets: 135 10*3/uL — ABNORMAL LOW (ref 150–400)
RBC: 3.55 MIL/uL — AB (ref 4.22–5.81)
RDW: 13.8 % (ref 11.5–15.5)
WBC: 5.6 10*3/uL (ref 4.0–10.5)

## 2015-10-12 LAB — BASIC METABOLIC PANEL
ANION GAP: 10 (ref 5–15)
BUN: 46 mg/dL — ABNORMAL HIGH (ref 6–20)
CHLORIDE: 108 mmol/L (ref 101–111)
CO2: 20 mmol/L — AB (ref 22–32)
Calcium: 9.2 mg/dL (ref 8.9–10.3)
Creatinine, Ser: 1.59 mg/dL — ABNORMAL HIGH (ref 0.61–1.24)
GFR calc non Af Amer: 38 mL/min — ABNORMAL LOW (ref >60)
GFR, EST AFRICAN AMERICAN: 45 mL/min — AB (ref >60)
GLUCOSE: 89 mg/dL (ref 65–99)
POTASSIUM: 3.6 mmol/L (ref 3.5–5.1)
Sodium: 138 mmol/L (ref 135–145)

## 2015-10-12 LAB — HEMOGLOBIN A1C
Hgb A1c MFr Bld: 5.7 % — ABNORMAL HIGH (ref 4.8–5.6)
Mean Plasma Glucose: 117 mg/dL

## 2015-10-12 MED ORDER — TRAMADOL HCL 50 MG PO TABS: 50.0000 mg | ORAL_TABLET | Freq: Four times a day (QID) | ORAL | Status: AC | PRN

## 2015-10-12 NOTE — Consult Note (Signed)
WOC wound consult note Reason for Consult:Stage 1 pressure injury with a Stage 2 pressure injury within. Wound type:Pressure Pressure Ulcer POA: Yes Measurement: 8cm x 8.5cm area of non-blanching erythema (Stage 1) with a 1cm round x 0.1cm Stage 2 pressure injury located over the left IT located within. Wound bed: red, moist Drainage (amount, consistency, odor) Scant serous Periwound: erythematous, non-bl;anching, see above. Dressing procedure/placement/frequency: A pressure redistribution chair cushion is provided and patient is taught about its use as well as limiting his reclining time to 2 hours with 2 hours of non-reclining time in between. Nursing is provided with wound care instructions via the Orders. [place] nursing team will not follow, but will remain available to this patient, the nursing and medical teams.  Please re-consult if needed. Thanks, Maudie Flakes, MSN, RN, [place], Arther Abbott  Pager# [phone]

## 2015-10-12 NOTE — Discharge Summary (Signed)
DISCHARGE SUMMARY  Isaiah Graham  MR#: [id number]  DOB:1930-06-22  Date of Admission: 10/09/2015 Date of Discharge: 10/12/2015  Attending Physician:Isaiah Graham  [name], MD  Consults: Gen Surgery  [facility]  Disposition: D/C home    Follow-up Appts:     Follow-up Information    Follow up with Isaiah Skinner, MD. Call on 10/26/2015.   Specialty:  General Surgery   Why:  For post-operation check with Isaiah Graham on 10/26/15 arriving at 11:45 am to fill out paperwork and check in for 12:15 pm appt.    Contact information:   [address] [place] [address] [phone]      Tests Needing Follow-up: -wound check post surgery   Discharge Diagnoses: L femoral hernia w/ bowel incarceration Chronic combined diastolic and systolic CHF  Moderate AoS HTN Stage 4 CKD Hypothyroidism BPH Hyperglycemia - not DM  Esophageal Cancer  Initial presentation: 82 y.o. male who presented w/ the acute onset of nausea, vomiting, and abdominal pain. Abdominal CT scan showed an incarcerated appearing left groin hernia with a high-grade small bowel obstruction. Surgery was consulted and was unable to reduce it successfully. Given his complex h/x, Cardiology was then consulted for operative clearance. The pt was ultimately taken to the OR on 4/18.  Hospital Course:  L femoral hernia w/ bowel incarceration S/p surgical correction 4/18 - care per Gen Surgery - tolerating soft diet at time of d/c - to f/u in Gen Surgery office   Chronic combined diastolic and systolic CHF  EF 0000000 w/ global HK - appears euvolemic at time of d/c    Moderate AoS Cards evaluated - was undergoing outpt w/u to consider TAVR - scheduled for L & R heart cath Wednesday 10/17/15  HTN BP reasonably well controlled  Stage 4 CKD Baseline crt 1.7-2.1 - crt at approx baseline and stable at time of d/c   Hypothyroidism No changes made in home tx  regimen  BPH No changes made in home tx regimen  Hyperglycemia  Moderate / mild on dextrose - A1c 5.7 therefore not true DM - likely stress reaction   Esophageal Cancer s/p partial esophagectomy 1997    Medication List    TAKE these medications        amLODipine 5 MG tablet  Commonly known as:  NORVASC  Take 1 tablet (5 mg total) by mouth daily.     aspirin 81 MG tablet  Take 81 mg by mouth daily.     FISH OIL PO  Take 1 capsule by mouth daily.     hydrochlorothiazide 12.5 MG capsule  Commonly known as:  MICROZIDE  Take 1 capsule (12.5 mg total) by mouth daily.     levothyroxine 88 MCG tablet  Commonly known as:  SYNTHROID, LEVOTHROID  Take 1 tablet (88 mcg total) by mouth daily.     mirtazapine 15 MG tablet  Commonly known as:  REMERON  TAKE 1 TABLET AT BEDTIME     omeprazole 20 MG capsule  Commonly known as:  PRILOSEC  TAKE 1 CAPSULE DAILY     STOOL SOFTENER PO  Take 2 tablets by mouth daily.     tamsulosin 0.4 MG Caps capsule  Commonly known as:  FLOMAX  TAKE 1 CAPSULE (0.4 MG TOTAL) BY MOUTH DAILY AFTER SUPPER.     traMADol 50 MG tablet  Commonly known as:  ULTRAM  Take 1-2 tablets (50-100 mg total) by mouth every 6 (six) hours as needed for  severe pain.       Day of Discharge BP 125/46 mmHg  Pulse 52  Temp(Src) 97.7 F (36.5 C) (Oral)  Resp 18  Ht 5\' 7"  (1.702 m)  Wt 54.9 kg (121 lb 0.5 oz)  BMI 18.95 kg/m2  SpO2 92%  Physical Exam: General: No acute respiratory distress - moved bowels last night - eating breakfast this AM Lungs: Clear to auscultation bilaterally without wheezes or crackles Cardiovascular: Regular rate and rhythm with 2/6 systolic M Abdomen: Nontender, nondistended, soft, bowel sounds positive, no rebound, no ascites, no appreciable mass Extremities: No significant cyanosis, clubbing, or edema bilateral lower extremities  Basic Metabolic Panel:  Recent Labs Lab 10/08/15 2346 10/09/15 1139 10/10/15 0340  10/11/15 0515 10/12/15 0623  NA 139 139 139 140 138  K 4.5 4.6 4.5 3.8 3.6  CL 108 108 112* 110 108  CO2 19* 18* 19* 19* 20*  GLUCOSE 132* 118* 133* 100* 89  BUN 56* 64* 64* 57* 46*  CREATININE 2.31* 2.33* 2.14* 1.89* 1.59*  CALCIUM 10.9* 10.0 9.4 9.5 9.2  MG  --  2.1  --   --   --   PHOS  --  4.3  --   --   --     Liver Function Tests:  Recent Labs Lab 10/09/15 1139 10/10/15 0340  AST 23 22  ALT 11* 10*  ALKPHOS 62 55  BILITOT 1.2 0.8  PROT 6.2* 5.7*  ALBUMIN 3.3* 2.8*   Coags:  Recent Labs Lab 10/08/15 1310 10/09/15 1139  INR 1.14 1.20   CBC:  Recent Labs Lab 10/08/15 1310 10/08/15 2346 10/10/15 0340 10/11/15 0515 10/12/15 0623  WBC 7.3 9.0 4.5 6.0 5.6  HGB 12.4* 12.7* 10.3* 11.0* 10.5*  HCT 36.5* 37.8* 30.7* 32.6* 31.2*  MCV 88.4 88.7 89.2 89.3 87.9  PLT 186 173 137* 132* 135*    Cardiac Enzymes:  Recent Labs Lab 10/09/15 1139 10/09/15 1404  TROPONINI 0.08* 0.09*    CBG:  Recent Labs Lab 10/10/15 0006 10/10/15 0410 10/10/15 0817 10/10/15 1144 10/10/15 1538  GLUCAP 143* 122* 99 97 95    Recent Results (from the past 240 hour(s))  MRSA PCR Screening     Status: None   Collection Time: 10/09/15  8:26 PM  Result Value Ref Range Status   MRSA by PCR NEGATIVE NEGATIVE Final    Comment:        The GeneXpert MRSA Assay (FDA approved for NASAL specimens only), is one component of a comprehensive MRSA colonization surveillance program. It is not intended to diagnose MRSA infection nor to guide or monitor treatment for MRSA infections.     Time spent in discharge (includes decision making & examination of pt): >30 minutes  10/12/2015, 9:00 AM   Isaiah Altes, MD [facility] Office  [phone] Pager [phone]  On-Call/Text Page:      Isaiah Graham      password [facility]

## 2015-10-12 NOTE — Evaluation (Signed)
Physical Therapy Evaluation Patient Details Name: SCHON ZEIDERS MRN: [id number] DOB: Jun 11, 1930 Today's Date: 10/12/2015   History of Present Illness  Patient is an 82 y/o male admitted with abdominal pain, N&V.  SBO/bowel incarceration: CT showing incarcerated L femoral hernia w/ high grade SBO and possible hemorrhage.  Now s/p open left femoral hernia repair with mesh.  PMH also positive for esophogeal CA wtih esphagectomy and Chronic CHF: h/o HTN, AS and dilated cardiomyopathy. EF 25% w/ Grade 1 diastolic dysfunction beginning work up for TAVR.   Clinical Impression  Patient presents with decreased mobility due to hospitalization and surgery.  Feel he is safe to d/c home with intermittent support from family using his RW at home.  Also would benefit from HHPT especially in that planned procedure next week.  Will sign off as anticipate d/c home today.    Follow Up Recommendations Home health PT    Equipment Recommendations  None recommended by PT    Recommendations for Other Services       Precautions / Restrictions Precautions Precautions: Fall      Mobility  Bed Mobility Overal bed mobility: Modified Independent                Transfers Overall transfer level: Modified independent                  Ambulation/Gait Ambulation/Gait assistance: Supervision Ambulation Distance (Feet): 300 Feet Assistive device: Rolling walker (2 wheeled) Gait Pattern/deviations: Step-through pattern;Decreased stride length;Trunk flexed     General Gait Details: initially in the room mobilized no device and LOB needing min A to recover so walked with walker in hallway  Stairs            Wheelchair Mobility    Modified Rankin (Stroke Patients Only)       Balance Overall balance assessment: Needs assistance   Sitting balance-Leahy Scale: Good Sitting balance - Comments: seated reaching down to don pants, socks, shoes   Standing balance support: No upper  extremity supported;During functional activity Standing balance-Leahy Scale: Fair Standing balance comment: stood to don pants, underwear without external support                             Pertinent Vitals/Pain Pain Assessment: Faces Faces Pain Scale: Hurts a little bit Pain Location: incision Pain Descriptors / Indicators: Sore Pain Intervention(s): Monitored during session    Home Living Family/patient expects to be discharged to:: Private residence Living Arrangements: Spouse/significant other Available Help at Discharge: Family;Available PRN/intermittently;Personal care attendant Type of Home: House Home Access: Ramped entrance     Home Layout: Two level;Able to live on main level with bedroom/bathroom Home Equipment: Gilford Rile - 2 wheels;Bedside commode;Shower seat - built in;Wheelchair Insurance claims handler - 4 wheels;Grab bars - tub/shower      Prior Function Level of Independence: Independent               Hand Dominance        Extremity/Trunk Assessment   Upper Extremity Assessment: Overall WFL for tasks assessed           Lower Extremity Assessment: Generalized weakness      Cervical / Trunk Assessment: Kyphotic  Communication   Communication: No difficulties  Cognition Arousal/Alertness: Awake/alert Behavior During Therapy: WFL for tasks assessed/performed Overall Cognitive Status: Within Functional Limits for tasks assessed  General Comments General comments (skin integrity, edema, etc.): reports aide for his wife comes in at 9 and stays till 3 and recently son has been staying with his wife at night while he has been in the hospital    Exercises        Assessment/Plan    PT Assessment All further PT needs can be met in the next venue of care  PT Diagnosis Abnormality of gait;Generalized weakness   PT Problem List Decreased balance;Decreased activity tolerance;Decreased strength;Decreased knowledge of  use of DME;Decreased mobility  PT Treatment Interventions     PT Goals (Current goals can be found in the Care Plan section) Acute Rehab PT Goals PT Goal Formulation: All assessment and education complete, DC therapy    Frequency     Barriers to discharge        Co-evaluation               End of Session   Activity Tolerance: Patient tolerated treatment well Patient left: in chair;with call bell/phone within reach           Time: 0910-0939 PT Time Calculation (min) (ACUTE ONLY): 29 min   Charges:   PT Evaluation $PT Eval Moderate Complexity: 1 Procedure PT Treatments $Gait Training: 8-22 mins   PT G Codes:          10/12/2015, 11:26 AM  Cyndi , PT [phone] 10/12/2015    

## 2015-10-12 NOTE — Consult Note (Signed)
   [facility] CM Inpatient Consult   10/12/2015  MOKSH SIGGINS 02/15/1930 [id number]   Patient evaluated for long-term disease management services with [place] Management program. He has a history of CHF. Went to bedside to discuss and offer [place] Management services. Patient is agreeable and written consent signed. Explained to patient that he will receive post hospital transition of care calls and will be evaluated for monthly home visits. Confirmed Primary Care MD as Burchette.  Confirmed best contact numbers as [phone] (home) or cell at [phone] (cell). Explained that [place] Management will not interfere or replace services provided by home health. Patient reports he lives with his wife. Discussed [facility] Care Management will follow up for his history of CHF.  Left [facility] Care Management packet and contact information at bedside. Will make inpatient RNCM aware that patient will be followed by [place] Management post hospital discharge.     Marthenia Rolling, MSN-Ed, RN,BSN [facility] Liaison [phone]

## 2015-10-12 NOTE — Progress Notes (Signed)
[facility] Progress Note  3 Days Post-Op  Subjective: Pt doing well, no significant pain.  No drainage from incision.  No N/V, appetite good.  Had a BM and flatus.  Objective: Vital signs in last 24 hours: Temp:  [97.7 F (36.5 C)-98.2 F (36.8 C)] 97.7 F (36.5 C) (04/21 0544) Pulse Rate:  [52-68] 52 (04/21 0544) Resp:  [18] 18 (04/21 0544) BP: (125-140)/(46-47) 125/46 mmHg (04/21 0544) SpO2:  [92 %-98 %] 92 % (04/21 0544) Last BM Date: 10/08/15  Intake/Output from previous day: 04/20 0701 - 04/21 0700 In: 360 [P.O.:360] Out: 641 [Urine:641] Intake/Output this shift:    PE: Gen: Alert, NAD, pleasant Abd/groin: Soft, NT/ND, +BS, no HSM, left groin incisions C/D/I with suture closure and steri-strips in place   Lab Results:   Recent Labs  10/11/15 0515 10/12/15 0623  WBC 6.0 5.6  HGB 11.0* 10.5*  HCT 32.6* 31.2*  PLT 132* 135*   BMET  Recent Labs  10/11/15 0515 10/12/15 0623  NA 140 138  K 3.8 3.6  CL 110 108  CO2 19* 20*  GLUCOSE 100* 89  BUN 57* 46*  CREATININE 1.89* 1.59*  CALCIUM 9.5 9.2   PT/INR  Recent Labs  10/09/15 1139  LABPROT 15.4*  INR 1.20   CMP     Component Value Date/Time   NA 138 10/12/2015 0623   K 3.6 10/12/2015 0623   CL 108 10/12/2015 0623   CO2 20* 10/12/2015 0623   GLUCOSE 89 10/12/2015 0623   BUN 46* 10/12/2015 0623   CREATININE 1.59* 10/12/2015 0623   CREATININE 2.12* 10/08/2015 1310   CALCIUM 9.2 10/12/2015 0623   PROT 5.7* 10/10/2015 0340   ALBUMIN 2.8* 10/10/2015 0340   AST 22 10/10/2015 0340   ALT 10* 10/10/2015 0340   ALKPHOS 55 10/10/2015 0340   BILITOT 0.8 10/10/2015 0340   GFRNONAA 38* 10/12/2015 0623   GFRAA 45* 10/12/2015 0623   Lipase  No results found for: LIPASE     Studies/Results: No results found.  Anti-infectives: Anti-infectives    Start     Dose/Rate Route Frequency Ordered Stop   10/09/15 1515  ceFAZolin (ANCEF) IVPB 2g/100 mL premix    Comments:  Pharmacy may  adjust dosing strength, interval, or rate of medication as needed for optimal therapy for the patient  Send with patient on call to the OR.  Anesthesia to complete antibiotic administration <69min prior to incision per [facility].   2 g 200 mL/hr over 30 Minutes Intravenous On call to O.R. 10/09/15 1500 10/09/15 1750       Assessment/Plan Incarcerated left femoral hernia POD #3 s/p open femoral hernia repair with mesh 10/09/15 - Dr. Kieth Brightly -Tolerating soft diet, oral pain meds -Ambulate and IS -Dressing removed, okay to shower -Okay to d/c home today from our perspective  Acute on chronic renal failure - Cr. Improved to 1.59, better than baseline Sacral Decubitus - WOC consult Disp:  Talked to daughter on the phone    LOS: 3 days    Isaiah Graham 10/12/2015, 8:47 AM Pager: [phone]  (7am - 4:30pm M-F; 7am - 11:30am Sa/Su)

## 2015-10-12 NOTE — Progress Notes (Signed)
Foley noted in chart but not present on assessment at this time.  Ave Filter, RN

## 2015-10-12 NOTE — Care Management Note (Signed)
Case Management Note  Patient Details  Name: LACHARLES DIMITRIOU MRN: [id number] Date of Birth: [birth year]  Subjective/Objective:      Admitted with Incarcerated left femoral hernia POD #3 s/p open femoral hernia repair with mesh 10/09/15.   Action/Plan: Plan is to d/c to home today, no further needs noted per CM.  Expected Discharge Date:   10/12/2015           Expected Discharge Plan:    In-House Referral:     Discharge planning Services  CM Consult  Post Acute Care Choice:    Choice offered to:     DME Arranged:    DME Agency:     HH Arranged:    HH Agency:     Status of Service:  Completed, signed off  Medicare Important Message Given:    Date Medicare IM Given:    Medicare IM give by:    Date Additional Medicare IM Given:    Additional Medicare Important Message give by:     If discussed at [place] of Stay Meetings, dates discussed:    Additional Comments:  Sharin Mons, [place] [phone] 10/12/2015, 10:09 AM

## 2015-10-12 NOTE — Care Management Important Message (Signed)
Important Message  Patient Details  Name: Isaiah Graham MRN: [id number] Date of Birth: Mar 07, 1930   Medicare Important Message Given:  Yes    Nathen May 10/12/2015, 10:33 AM

## 2015-10-12 NOTE — Progress Notes (Signed)
Discharge instructions reviewed with patient/family. RX given. Wound care instruction reviewed with items provided. Chair cushion given. No other questions or concerns voice at this time. Transport home by family.   Oriya Kettering, RN.

## 2015-10-12 NOTE — Care Management Note (Signed)
Case Management Note  Patient Details  Name: Isaiah Graham MRN: [id number] Date of Birth: Apr 19, 1930   Expected Discharge Date:   10/12/2015           Expected Discharge Plan:  [place]  In-House Referral:     Discharge planning Services  CM Consult  Post Acute Care Choice:    Choice offered to:  Patient  DME Arranged:    DME Agency:    HH Arranged:  PT [place]:  [place]  Status of Service:  Completed, signed off  Medicare Important Message Given:  Yes Date Medicare IM Given:    Medicare IM give by:    Date Additional Medicare IM Given:    Additional Medicare Important Message give by:     If discussed at [place] of Stay Meetings, dates discussed:    Additional Comments:   CM made referral with [facility](Tiffany) [phone] for HHPT.  Whitman Hero [place], [place] [phone] 10/12/2015, 11:51 AM

## 2015-10-15 ENCOUNTER — Other Ambulatory Visit: Payer: Self-pay

## 2015-10-15 NOTE — Patient Outreach (Signed)
[facility]) Care Management  10/15/2015  ULISSES RABINOWITZ [birth year] [id number]  Assessment: Transition of care call. Member admitted 4/18-4/21 with small bowel obstruction; left inguinal hernia repair. Member denies any issues or concerns at this time. Member reports he is scheduled for a heart catheterization tomorrow. [facility].  Plan: transition of care call next week.  Thea Silversmith, RN, MSN, [place] Coordinator Cell: [phone]

## 2015-10-15 NOTE — Telephone Encounter (Signed)
Dr Burt Knack contacted the pt's daughter:  Sherren Mocha, MD  Barkley Boards, RN           Lauren - he's doing ok after surgery. I talked to Jana Half and he will come in Tuesday for hydration prior to cath.   thx     I contacted the hospital and confirmed that the pt is on the list for admission tomorrow.  The pt's daughter will be contacted when a bed becomes available.

## 2015-10-16 ENCOUNTER — Observation Stay (HOSPITAL_COMMUNITY)
Admission: RE | Admit: 2015-10-16 | Discharge: 2015-10-17 | Disposition: A | Discharge status: Home / Self Care | Payer: Medicare Other | Source: Ambulatory Visit | Attending: Cardiovascular Disease | Admitting: Cardiovascular Disease

## 2015-10-16 ENCOUNTER — Telehealth: Payer: Self-pay | Admitting: Cardiovascular Disease

## 2015-10-16 ENCOUNTER — Encounter (HOSPITAL_COMMUNITY): Payer: Self-pay | Admitting: General Practice

## 2015-10-16 DIAGNOSIS — N184 Chronic kidney disease, stage 4 (severe): Secondary | ICD-10-CM | POA: Insufficient documentation

## 2015-10-16 DIAGNOSIS — I351 Nonrheumatic aortic (valve) insufficiency: Secondary | ICD-10-CM | POA: Diagnosis present

## 2015-10-16 DIAGNOSIS — I5022 Chronic systolic (congestive) heart failure: Secondary | ICD-10-CM | POA: Diagnosis not present

## 2015-10-16 DIAGNOSIS — I2510 Atherosclerotic heart disease of native coronary artery without angina pectoris: Secondary | ICD-10-CM | POA: Insufficient documentation

## 2015-10-16 DIAGNOSIS — I350 Nonrheumatic aortic (valve) stenosis: Secondary | ICD-10-CM

## 2015-10-16 DIAGNOSIS — I130 Hypertensive heart and chronic kidney disease with heart failure and stage 1 through stage 4 chronic kidney disease, or unspecified chronic kidney disease: Secondary | ICD-10-CM | POA: Diagnosis not present

## 2015-10-16 DIAGNOSIS — I2582 Chronic total occlusion of coronary artery: Secondary | ICD-10-CM | POA: Diagnosis not present

## 2015-10-16 DIAGNOSIS — I429 Cardiomyopathy, unspecified: Secondary | ICD-10-CM | POA: Diagnosis not present

## 2015-10-16 LAB — BASIC METABOLIC PANEL
ANION GAP: 8 (ref 5–15)
BUN: 32 mg/dL — AB (ref 6–20)
CALCIUM: 9.5 mg/dL (ref 8.9–10.3)
CO2: 21 mmol/L — ABNORMAL LOW (ref 22–32)
Chloride: 110 mmol/L (ref 101–111)
Creatinine, Ser: 1.68 mg/dL — ABNORMAL HIGH (ref 0.61–1.24)
GFR calc Af Amer: 42 mL/min — ABNORMAL LOW (ref >60)
GFR, EST NON AFRICAN AMERICAN: 36 mL/min — AB (ref >60)
GLUCOSE: 128 mg/dL — AB (ref 65–99)
Potassium: 4.3 mmol/L (ref 3.5–5.1)
SODIUM: 139 mmol/L (ref 135–145)

## 2015-10-16 LAB — CBC
HCT: 31.6 % — ABNORMAL LOW (ref 39.0–52.0)
Hemoglobin: 10.5 g/dL — ABNORMAL LOW (ref 13.0–17.0)
MCH: 29.7 pg (ref 26.0–34.0)
MCHC: 33.2 g/dL (ref 30.0–36.0)
MCV: 89.3 fL (ref 78.0–100.0)
PLATELETS: 172 10*3/uL (ref 150–400)
RBC: 3.54 MIL/uL — ABNORMAL LOW (ref 4.22–5.81)
RDW: 13.8 % (ref 11.5–15.5)
WBC: 5 10*3/uL (ref 4.0–10.5)

## 2015-10-16 LAB — PROTIME-INR
INR: 1.24 (ref 0.00–1.49)
PROTHROMBIN TIME: 15.7 s — AB (ref 11.6–15.2)

## 2015-10-16 MED ORDER — MIRTAZAPINE 15 MG PO TABS
15.0000 mg | ORAL_TABLET | Freq: Every day | ORAL | Status: DC
  Administered 2015-10-16: 15 mg via ORAL
  Filled 2015-10-16 (×2): qty 1

## 2015-10-16 MED ORDER — LEVOTHYROXINE SODIUM 88 MCG PO TABS
88.0000 ug | ORAL_TABLET | Freq: Every day | ORAL | Status: DC
  Administered 2015-10-17: 88 ug via ORAL
  Filled 2015-10-16: qty 1

## 2015-10-16 MED ORDER — SODIUM CHLORIDE 0.9% FLUSH: 3.0000 mL | INTRAVENOUS | Status: DC | PRN

## 2015-10-16 MED ORDER — TRAMADOL HCL 50 MG PO TABS: 50.0000 mg | ORAL_TABLET | Freq: Four times a day (QID) | ORAL | Status: DC | PRN

## 2015-10-16 MED ORDER — ASPIRIN EC 81 MG PO TBEC
81.0000 mg | DELAYED_RELEASE_TABLET | Freq: Every day | ORAL | Status: DC
  Filled 2015-10-16: qty 1

## 2015-10-16 MED ORDER — ASPIRIN 81 MG PO CHEW
81.0000 mg | CHEWABLE_TABLET | ORAL | Status: AC
  Administered 2015-10-17: 81 mg via ORAL
  Filled 2015-10-16: qty 1

## 2015-10-16 MED ORDER — SODIUM CHLORIDE 0.9 % IV SOLN: 250.0000 mL | INTRAVENOUS | Status: DC | PRN

## 2015-10-16 MED ORDER — ASPIRIN EC 81 MG PO TBEC: 81.0000 mg | DELAYED_RELEASE_TABLET | Freq: Every day | ORAL | Status: DC

## 2015-10-16 MED ORDER — PANTOPRAZOLE SODIUM 40 MG PO TBEC
40.0000 mg | DELAYED_RELEASE_TABLET | Freq: Every day | ORAL | Status: DC
  Filled 2015-10-16: qty 1

## 2015-10-16 MED ORDER — SODIUM CHLORIDE 0.9% FLUSH
3.0000 mL | Freq: Two times a day (BID) | INTRAVENOUS | Status: DC
  Administered 2015-10-16: 3 mL via INTRAVENOUS

## 2015-10-16 MED ORDER — DOCUSATE SODIUM 100 MG PO CAPS
200.0000 mg | ORAL_CAPSULE | Freq: Every day | ORAL | Status: DC
  Filled 2015-10-16 (×2): qty 2

## 2015-10-16 MED ORDER — TAMSULOSIN HCL 0.4 MG PO CAPS
0.4000 mg | ORAL_CAPSULE | Freq: Every day | ORAL | Status: DC
  Administered 2015-10-16: 0.4 mg via ORAL
  Filled 2015-10-16 (×2): qty 1

## 2015-10-16 MED ORDER — SODIUM CHLORIDE 0.9 % IV SOLN
INTRAVENOUS | Status: DC
  Administered 2015-10-16: 21:00:00 via INTRAVENOUS

## 2015-10-16 NOTE — Telephone Encounter (Signed)
I spoke with the pt's daughter and made her aware that the hospital does have the pt down for admission today. They will contact her when a bed becomes available.  I instructed Jana Half to contact the office at 4:30 today if she has not been called in regards to the pt being admitted. She agreed with plan.

## 2015-10-16 NOTE — H&P (View-Only) (Signed)
Cardiology Office Note   Date:  09/17/2015   ID:  Isaiah Graham, DOB 1930-05-29, MRN [id number]  PCP:  Eulas Post, MD    Chief Complaint  Patient presents with  . Cardiomyopathy  . Hypertension  . Aortic Stenosis      History of Present Illness: Isaiah Graham is a 82 y.o. male who presents for evaluation of chest pain.  He has a history of esophageal stricture and CA s/p resection in 1996, hypothyroidism, GERD, HTN and CKD stage 4. He recently saw his PCP for PE and complained of fatigue.  He was noted to have a heart murmur and a 2D echo was ordered which showed severe LV dysfunction with EF 20-25% with global HK and inferolateral AK, moderate AS, moderate AR and moderate pulmonary HTN.  He is now referred for further evaluation.  He denies any chest pain, SOB, DOE, LE edema, dizziness, palpitations or syncope.  He has complained recently of exertional fatigue and weakness in his legs with exertion that has been going on for a few months.      Past Medical History  Diagnosis Date  . Hypertension   . Chronic kidney disease     kidney stones   . Esophageal cancer ([facility]) 1990  . Hypothyroidism   . Inguinal hernia February 2013    right  . Esophageal stricture   . Food impaction of esophagus   . Barrett esophagus   . Gastroparesis   . Aortic aneurysm, abdominal ([facility])     repair  . Arthritis     Past Surgical History  Procedure Laterality Date  . Sp endo repair infraren aaa  8/09    es  . Coarctation of aorta repair  2009  . Inguinal hernia repair  09/01/11    bilateral  . Esophagogastroduodenoscopy  10/05/2011    Procedure: ESOPHAGOGASTRODUODENOSCOPY (EGD);  Surgeon: Ladene Artist, MD,FACG;  [facility];  Service: Endoscopy;  Laterality: N/A;  . Vagotomy    . Esophagectomy      partial  . Umbilical hernia repair    . Total hip arthroplasty Right 08/06/2012    Procedure: RIGHT TOTAL HIP ARTHOPLASTY WITH ACETABULAR  AUTOGRAFT;  Surgeon: Gearlean Alf, MD;  Location: [facility] ORS;  Service: Orthopedics;  Laterality: Right;  RIGHT TOTAL HIP ARTHOPLASTY WITH ACETABULAR AUTOGRAFT      Current Outpatient Prescriptions  Medication Sig Dispense Refill  . amLODipine (NORVASC) 10 MG tablet Take 1 tablet (10 mg total) by mouth daily. 90 tablet 3  . aspirin 81 MG tablet Take 81 mg by mouth daily.    [name] Calcium (STOOL SOFTENER PO) Take 2 tablets by mouth daily.    . hydrochlorothiazide (MICROZIDE) 12.5 MG capsule Take 1 capsule (12.5 mg total) by mouth daily. 90 capsule 3  . levothyroxine (SYNTHROID, LEVOTHROID) 88 MCG tablet Take 1 tablet (88 mcg total) by mouth daily. 90 tablet 2  . mirtazapine (REMERON) 15 MG tablet TAKE 1 TABLET AT BEDTIME 90 tablet 2  . Omega-3 Fatty Acids (FISH OIL PO) Take 1 capsule by mouth daily.    Marland Kitchen omeprazole (PRILOSEC) 20 MG capsule TAKE 1 CAPSULE DAILY 90 capsule 2  . tamsulosin (FLOMAX) 0.4 MG CAPS capsule TAKE 1 CAPSULE (0.4 MG TOTAL) BY MOUTH DAILY AFTER SUPPER. 90 capsule 3   No current facility-administered medications for this visit.    Allergies:   Review of patient's  allergies indicates no known allergies.    Social History:  The patient  reports that he quit smoking about 44 years ago. His smoking use included Cigarettes. He has a 11 pack-year smoking history. He has never used smokeless tobacco. He reports that he does not drink alcohol or use illicit drugs.   Family History:  The patient's 69family history includes Alzheimer's disease in his sister; Aneurysm in his daughter; Glaucoma in his sister; Pancreatic cancer in his father.    ROS:  Please see the history of present illness.   Otherwise, review of systems are positive for none.   All other systems are reviewed and negative.    PHYSICAL EXAM: VS:  BP 146/52 mmHg  Pulse 57  Ht 5\' 7"  (1.702 m)  Wt 123 lb 1.9 oz (55.847 kg)  BMI 19.28 kg/m2 , BMI Body mass index is 19.28 kg/(m^2). GEN: Well nourished, well  developed, in no acute distress HEENT: normal Neck: no JVD, carotid bruits, or masses Cardiac: RRR; no murmurs, rubs, or gallops,no edema  Respiratory:  clear to auscultation bilaterally, normal work of breathing GI: soft, nontender, nondistended, + BS MS: no deformity or atrophy Skin: warm and dry, no rash Neuro:  Strength and sensation are intact Psych: euthymic mood, full affect   EKG:  EKG is ordered today. The ekg ordered today demonstrates sinus bradycardia at 57bpm with LVH and repol abnormality and nonspecific IVCD   Recent Labs: 08/29/2015: ALT 11; BUN 39*; Creatinine, Ser 1.70*; Hemoglobin 11.2*; Platelets 156.0; Potassium 4.3; Sodium 139; TSH 7.44*    Lipid Panel    Component Value Date/Time   CHOL 196 01/31/2013 1639   TRIG 94.0 01/31/2013 1639   HDL 46.20 01/31/2013 1639   CHOLHDL 4 01/31/2013 1639   VLDL 18.8 01/31/2013 1639   LDLCALC 131* 01/31/2013 1639   LDLDIRECT 143.0 12/04/2010 1157      Wt Readings from Last 3 Encounters:  09/17/15 123 lb 1.9 oz (55.847 kg)  08/29/15 126 lb 12.8 oz (57.516 kg)  01/24/15 126 lb (57.153 kg)       ASSESSMENT AND PLAN:  1.  Severe LV dysfunction ? Etiology. Echo showed regional wall motion abnormalities and diffuse HK. He also had moderate AS on echo which I suspect is underestimated and is probably severe given LV dysfunction and could also be etiology of DCM.  Will not add BB due to resting bradycardia.  No ACE I/ARB due to CKD.  Consider adding hydralazine/nitrates for LV dysfunction but want to assess severity of AS first.  2.  Moderate AS by mean gradient and peak velocity but this may be underestimated given his LV dysfunction.  Will assess further with dobutamine echo and then if appears severe, will refer to Dr. Burt Knack for evaluation for TAVR.  I do not think he would be a good operative candidate for open AVR given history of esophageal CA with extensive esophageal resection as well as debilitated state.   3.  HTN  -borderline controlled 4.  Exertional fatigue which I suspect is due to LV dysfunction and AS. I will get a dobutamine echo to assess for ischemia as well as determine if AV gradient increases with improved LVF during dobutamine infusion.     Current medicines are reviewed at length with the patient today.  The patient does not have concerns regarding medicines.  The following changes have been made:  no change  Labs/ tests ordered today: See above Assessment and Plan No orders of the defined types were  placed in this encounter.     Disposition:   FU with me once dobutamine results are back  SignedSueanne Margarita, MD  09/17/2015 9:19 AM    [facility] [place], [place], [place]  [address] Phone: [phone]; Fax: [phone]

## 2015-10-16 NOTE — Progress Notes (Signed)
[facility]  Patient Status: Active (receiving services up to time of hospitalization)  [facility] is providing the following services: PT  If patient discharges after hours, please call [phone].   Isaiah Graham 10/16/2015, 4:46 PM

## 2015-10-16 NOTE — Interval H&P Note (Signed)
History and Physical Interval Note:  10/16/2015 3:01 PM  Isaiah Graham  has presented today for surgery, with the diagnosis of aortic stenosos  The various methods of treatment have been discussed with the patient and family. After consideration of risks, benefits and other options for treatment, the patient has consented to  Procedure(s): Right/Left Heart Cath and Coronary Angiography (N/A) as a surgical intervention .  The patient's history has been reviewed, patient examined, no change in status, stable for surgery.  I have reviewed the patient's chart and labs.  Questions were answered to the patient's satisfaction.    Pt seen/examined. I have followed him last week when he was hospitalized with an incarcerated inguinal hernia. He has recovered very well from surgery, but remains chronically frail and fairly debilitated. Plan for admission today for hydration prior to cath/possible PCI tomorrow. All data reviewed. Dobutamine echo study as outlined below:  Study Conclusions  - Aortic valve: Possibly bicuspid; moderately thickened, moderately  calcified leaflets. Transvalvular velocity was increased more  than expected due to aortic insufficiency. There was moderate  stenosis. There was moderate to severe regurgitation directed  eccentrically in the LVOT.  Impressions:  - Moderate &quot;true&quot; aortic stenosis.  The jet is early peaking. While gradients increase appropriately  with inotrope administration, they do not reach severe aortic  stenosis range. Gradients are exaggerated due to aortic  insufficiency-related increase in stroke volume.  There is evidence of severe ischemic cardiomyopathy.  Increased cardiac output and increased aortic valve gradients  confirm the presence of contractile reserve, but 2D imaging was  not performed during inotrope administration. Unable to assess  viability in the areas of myocardial dysfunction. However,  baseline 2D  images suggest scarring in the inferior and  inferolateral walls, consistent with prior infarction in the  right coronary artery and left circumflex artery distribution.  ------------------------------------------------------------------- Labs, prior tests, procedures, and surgery: Coarctation of Aorta Repair-2009. Dobutamine. Stress echocardiography. 2D. Birthdate: Patient birthdate: [birth year]. Age: Patient is 82 yr old. Sex: Gender: male. BMI: 19.3 kg/m^2. Blood pressure: 140/65 Patient status: Outpatient. Study date: Study date: 09/20/2015. Study time: 07:41 AM.  -------------------------------------------------------------------  ------------------------------------------------------------------- Aortic valve: Possibly bicuspid; moderately thickened, moderately calcified leaflets. Doppler: Transvalvular velocity was increased more than expected due to aortic insufficiency. There was moderate stenosis. There was moderate to severe regurgitation directed eccentrically in the LVOT. VTI ratio of LVOT to aortic valve: 0.22. Valve area (VTI): 1.09 cm^2. Indexed valve area (VTI): 0.67 cm^2/m^2. Peak velocity ratio of LVOT to aortic valve: 0.18. Valve area (Vmax): 0.89 cm^2. Indexed valve area (Vmax): 0.55 cm^2/m^2. Mean velocity ratio of LVOT to aortic valve: 0.18. Valve area (Vmean): 0.87 cm^2. Indexed valve area (Vmean): 0.54 cm^2/m^2. Mean gradient (S): 49 mm Hg. Peak gradient (S): 74 mm Hg.  ------------------------------------------------------------------- Stress protocol:  +-----------------------+--+-----------+--------+ Stage HRBP (mmHg) Symptoms +-----------------------+--+-----------+--------+ Baseline -------------None  +-----------------------+--+-----------+--------+ Dobutamine 5 ug/kg/min 57140/65 (90)-------- +-----------------------+--+-----------+--------+ Dobutamine 10  ug/kg/min53156/52 (87)-------- +-----------------------+--+-----------+--------+ Dobutamine 20 ug/kg/min72156/58 (91)-------- +-----------------------+--+-----------+--------+ Immediate post stress 58------------------- +-----------------------+--+-----------+--------+ Recovery; 1 min 62159/60 (93)-------- +-----------------------+--+-----------+--------+ Recovery; 2 min 60------------------- +-----------------------+--+-----------+--------+ Recovery; 3 min 57155/55 (88)-------- +-----------------------+--+-----------+--------+ Recovery; 4 min 58------------------- +-----------------------+--+-----------+--------+ Recovery; 5 min 56154/59 (91)-------- +-----------------------+--+-----------+--------+  ------------------------------------------------------------------- Stress results: Maximal heart rate during stress was 72 bpm (53% of maximal predicted heart rate). The maximal predicted heart rate was 137 bpm. The rate-pressure product for the peak heart rate and blood pressure was 11232 mm Hg/min.  ------------------------------------------------------------------- Baseline:  - LV global systolic function was severely reduced. The estimated  LV ejection fraction was  25-30%. - There was severe diffuse LV hypokinesis with distinct regional  wall motion abnormalities. - Akinesis and scarring of the mid inferoseptal, basal, mid, and  apical inferior, basal and mid inferolateral, and apical septal  LV myocardium. - Stroke volume 87 mL, CO 4.7 L/min, AV peak gradient 45 mm Hg, AV  mean gradient 24 mm Hg, AVA 1.1 cm sq. Early peaking jet.  Low dose 5 ug/kg/min: Stroke volume 93 mL, CO 4.7 L/min, AV peak gradient 57 mm Hg, AV mean gradient 32 mm Hg, AVA 1.1 cm sq. Early peaking jet. Mid dose 10 ug/kg/min: Stroke volume 98 mL, CO 5.1 L/min, AV peak gradient 68 mm Hg, AV mean gradient 40 mm Hg, AVA  1.1 cm sq. Early peaking jet. High dose 20 ug/kg/min: Stroke volume 96 mL, CO 5.4 L/min, AV peak gradient 77 mm Hg, AV mean gradient 49 mm Hg, AVA 1.1 cm sq. Early peaking jet.  The patient will undergo right and left heart cath to assess for CAD (ischemic cardiomyopathy based on echo findings) and aortic stenosis/insufficiency. I have reviewed the risks, indications, and alternatives to cardiac catheterization, possible angioplasty, and stenting with the patient. Risks include but are not limited to bleeding, infection, vascular injury, stroke, myocardial infection, arrhythmia, kidney injury, radiation-related injury in the case of prolonged fluoroscopy use, emergency cardiac surgery, and death. The patient understands the risks of serious complication is 1-2 in [id number] with diagnostic cardiac cath and 1-2% or less with angioplasty/stenting.   Will hydrate overnight with 0.9%NS to try to prevent contrast nephropathy. Will limit dye as much as possible.   Sherren Mocha

## 2015-10-16 NOTE — Progress Notes (Signed)
Pt admitted to unit. MD notified of pts arrival. Pt placed on tele. Call bell and phone within reach. Daughter at bedside. Will wait for orders.

## 2015-10-16 NOTE — Telephone Encounter (Signed)
New message     Talk to the nurse regarding pt checking in hosp tonight at 5 for fluids and having a cath in the morning.  Dr Burt Knack has been seeing pt in the hosp.  Please call

## 2015-10-17 ENCOUNTER — Encounter (HOSPITAL_COMMUNITY)
Admission: RE | Disposition: A | Discharge status: Home / Self Care | Payer: Self-pay | Source: Ambulatory Visit | Attending: Cardiovascular Disease

## 2015-10-17 ENCOUNTER — Telehealth: Payer: Self-pay

## 2015-10-17 ENCOUNTER — Encounter (HOSPITAL_COMMUNITY): Payer: Self-pay | Admitting: Cardiovascular Disease

## 2015-10-17 DIAGNOSIS — I5022 Chronic systolic (congestive) heart failure: Secondary | ICD-10-CM | POA: Diagnosis present

## 2015-10-17 DIAGNOSIS — I351 Nonrheumatic aortic (valve) insufficiency: Secondary | ICD-10-CM | POA: Diagnosis not present

## 2015-10-17 DIAGNOSIS — I350 Nonrheumatic aortic (valve) stenosis: Secondary | ICD-10-CM | POA: Diagnosis not present

## 2015-10-17 HISTORY — PX: CARDIAC CATHETERIZATION: SHX172

## 2015-10-17 LAB — BASIC METABOLIC PANEL
ANION GAP: 6 (ref 5–15)
BUN: 28 mg/dL — ABNORMAL HIGH (ref 6–20)
CALCIUM: 9.3 mg/dL (ref 8.9–10.3)
CO2: 22 mmol/L (ref 22–32)
Chloride: 111 mmol/L (ref 101–111)
Creatinine, Ser: 1.58 mg/dL — ABNORMAL HIGH (ref 0.61–1.24)
GFR calc non Af Amer: 39 mL/min — ABNORMAL LOW (ref >60)
GFR, EST AFRICAN AMERICAN: 45 mL/min — AB (ref >60)
Glucose, Bld: 91 mg/dL (ref 65–99)
POTASSIUM: 4.2 mmol/L (ref 3.5–5.1)
Sodium: 139 mmol/L (ref 135–145)

## 2015-10-17 LAB — POCT I-STAT 3, VENOUS BLOOD GAS (G3P V)
Acid-base deficit: 2 mmol/L (ref 0.0–2.0)
Bicarbonate: 22.4 mEq/L (ref 20.0–24.0)
O2 SAT: 60 %
PCO2 VEN: 37.8 mmHg — AB (ref 45.0–50.0)
TCO2: 24 mmol/L (ref 0–100)
pH, Ven: 7.381 — ABNORMAL HIGH (ref 7.250–7.300)
pO2, Ven: 32 mmHg (ref 31.0–45.0)

## 2015-10-17 LAB — POCT I-STAT 3, ART BLOOD GAS (G3+)
ACID-BASE DEFICIT: 2 mmol/L (ref 0.0–2.0)
BICARBONATE: 22.7 meq/L (ref 20.0–24.0)
O2 Saturation: 93 %
PH ART: 7.362 (ref 7.350–7.450)
TCO2: 24 mmol/L (ref 0–100)
pCO2 arterial: 40.1 mmHg (ref 35.0–45.0)
pO2, Arterial: 68 mmHg — ABNORMAL LOW (ref 80.0–100.0)

## 2015-10-17 SURGERY — RIGHT/LEFT HEART CATH AND CORONARY ANGIOGRAPHY
Anesthesia: LOCAL

## 2015-10-17 MED ORDER — MIDAZOLAM HCL 2 MG/2ML IJ SOLN
INTRAMUSCULAR | Status: AC
  Filled 2015-10-17: qty 2

## 2015-10-17 MED ORDER — FENTANYL CITRATE (PF) 100 MCG/2ML IJ SOLN
INTRAMUSCULAR | Status: AC
  Filled 2015-10-17: qty 2

## 2015-10-17 MED ORDER — HEPARIN (PORCINE) IN NACL 2-0.9 UNIT/ML-% IJ SOLN
INTRAMUSCULAR | Status: AC
  Filled 2015-10-17: qty 1000

## 2015-10-17 MED ORDER — VERAPAMIL HCL 2.5 MG/ML IV SOLN
INTRAVENOUS | Status: AC
  Filled 2015-10-17: qty 2

## 2015-10-17 MED ORDER — SODIUM CHLORIDE 0.9% FLUSH: 3.0000 mL | INTRAVENOUS | Status: DC | PRN

## 2015-10-17 MED ORDER — HEPARIN SODIUM (PORCINE) 1000 UNIT/ML IJ SOLN
INTRAMUSCULAR | Status: DC | PRN
  Administered 2015-10-17: 3000 [IU] via INTRAVENOUS

## 2015-10-17 MED ORDER — SODIUM CHLORIDE 0.9% FLUSH: 3.0000 mL | Freq: Two times a day (BID) | INTRAVENOUS | Status: DC

## 2015-10-17 MED ORDER — SODIUM CHLORIDE 0.9 % IV SOLN: 250.0000 mL | INTRAVENOUS | Status: DC | PRN

## 2015-10-17 MED ORDER — MIDAZOLAM HCL 2 MG/2ML IJ SOLN
INTRAMUSCULAR | Status: DC | PRN
  Administered 2015-10-17: 1 mg via INTRAVENOUS

## 2015-10-17 MED ORDER — HEPARIN (PORCINE) IN NACL 2-0.9 UNIT/ML-% IJ SOLN
INTRAMUSCULAR | Status: DC | PRN
  Administered 2015-10-17: 1000 mL

## 2015-10-17 MED ORDER — HEPARIN SODIUM (PORCINE) 1000 UNIT/ML IJ SOLN
INTRAMUSCULAR | Status: AC
  Filled 2015-10-17: qty 1

## 2015-10-17 MED ORDER — LIDOCAINE HCL (PF) 1 % IJ SOLN
INTRAMUSCULAR | Status: AC
  Filled 2015-10-17: qty 30

## 2015-10-17 MED ORDER — FENTANYL CITRATE (PF) 100 MCG/2ML IJ SOLN
INTRAMUSCULAR | Status: DC | PRN
  Administered 2015-10-17: 25 ug via INTRAVENOUS

## 2015-10-17 MED ORDER — IOPAMIDOL (ISOVUE-370) INJECTION 76%
INTRAVENOUS | Status: AC
  Filled 2015-10-17: qty 100

## 2015-10-17 MED ORDER — HEPARIN (PORCINE) IN NACL 2-0.9 UNIT/ML-% IJ SOLN
INTRAMUSCULAR | Status: DC | PRN
  Administered 2015-10-17: 09:00:00 via INTRA_ARTERIAL

## 2015-10-17 MED ORDER — ACETAMINOPHEN 325 MG PO TABS: 650.0000 mg | ORAL_TABLET | ORAL | Status: DC | PRN

## 2015-10-17 MED ORDER — ONDANSETRON HCL 4 MG/2ML IJ SOLN: 4.0000 mg | Freq: Four times a day (QID) | INTRAMUSCULAR | Status: DC | PRN

## 2015-10-17 MED ORDER — SODIUM CHLORIDE 0.9 % IV SOLN: INTRAVENOUS | Status: AC

## 2015-10-17 MED ORDER — LIDOCAINE HCL (PF) 1 % IJ SOLN
INTRAMUSCULAR | Status: DC | PRN
Start: 2015-10-17 — End: 2015-10-17
  Administered 2015-10-17 (×2): 2 mL

## 2015-10-17 MED ORDER — IOPAMIDOL (ISOVUE-370) INJECTION 76%
INTRAVENOUS | Status: DC | PRN
  Administered 2015-10-17: 72 mL via INTRA_ARTERIAL

## 2015-10-17 SURGICAL SUPPLY — 14 items
CATH BALLN WEDGE 5F 110CM (CATHETERS) ×1 IMPLANT
CATH INFINITI 5 FR JL3.5 (CATHETERS) ×1 IMPLANT
CATH INFINITI 5FR ANG PIGTAIL (CATHETERS) ×1 IMPLANT
CATH INFINITI JR4 5F (CATHETERS) ×1 IMPLANT
DEVICE RAD COMP TR BAND LRG (VASCULAR PRODUCTS) ×1 IMPLANT
GLIDESHEATH SLEND SS 6F .021 (SHEATH) ×1 IMPLANT
KIT HEART LEFT (KITS) ×2 IMPLANT
PACK CARDIAC CATHETERIZATION (CUSTOM PROCEDURE TRAY) ×2 IMPLANT
SHEATH FAST CATH BRACH 5F 5CM (SHEATH) ×1 IMPLANT
SYR MEDRAD MARK V 150ML (SYRINGE) ×2 IMPLANT
TRANSDUCER W/STOPCOCK (MISCELLANEOUS) ×3 IMPLANT
TUBING CIL FLEX 10 FLL-RA (TUBING) ×2 IMPLANT
WIRE HI TORQ VERSACORE-J 145CM (WIRE) ×1 IMPLANT
WIRE SAFE-T 1.5MM-J .035X260CM (WIRE) ×1 IMPLANT

## 2015-10-17 NOTE — Discharge Summary (Signed)
Discharge Summary    Patient ID: Isaiah Graham,  MRN: [id number], DOB/AGE: [birth year] 82 y.o.  Admit date: 10/16/2015 Discharge date: 10/17/2015  Primary Care Provider: Eulas Post Primary Cardiologist:Dr. Radford Pax  Discharge Diagnoses    Active Problems:   Aortic stenosis   Chronic systolic heart failure (HCC)   Chronic systolic (congestive) heart failure (HCC)   Allergies Allergies  Allergen Reactions  . Morphine And Related Other (See Comments)    Intolerance - hallucinations, delusions on high doses per daughter    Diagnostic Studies/Procedures  Right/Left Heart Cath and Coronary Angiography  Prox LAD lesion, 80% stenosed.  Mid LAD lesion, 90% stenosed.  Mid Cx lesion, 50% stenosed.  Dist RCA lesion, 100% stenosed.  1. Severe 2 vessel CAD with total occlusion of the RCA and severe stenosis of the LAD with heavy diffuse coronary calcification 2. Dilated aortic root with severe aortic insufficiency 3. Minimal transaortic valve gradient on LV pullback, suggestive of no significant aortic stenosis  Suspect heart failure and cardiomyopathy due to a combination of coronary disease and aortic insufficiency as primary valve lesion. The aortic sinuses appear severely dilated on aortography and there is severe global LV systolic dysfunction with LVEF estimated < 30%.  Will refer to cardiac surgery for evaluation, but I suspect this patient will not be a candidate for surgical intervention. _____________   History of Present Illness   Isaiah Graham is a 82 year old male with a past medical history of  HTN, CKD stage IV, and aortic stenosis. He underwent dobutamine Echo outpatient and it showed severe LV dysfunction as well as moderate AS. He was referred to Dr. Burt Knack for cath and therefore admitted on 10/16/15 for overnight hydration for left and right heart cath.     Hospital Course  He underwent left and right heart cath on 10/17/15 (report above).  Cath  showed  Severe 2 vessel disease with total occlusion of the RCA.  It was reccommended that he be evaluated outpatient by CVTS.   He had minimal transaortic valve gradient, suggesting no aortic stenosis. His mean gradient by Echo was 25. He was stable after cath, he is bradycardic with HR in the 50's but this is his normal.  BP stable.      His right radial site is stable.  He was seen by Dr. Burt Knack and deemed suitable for discharge. He will receive home PT.   _____________  Discharge Vitals Blood pressure 129/51, pulse 57, temperature 98.2 F (36.8 C), temperature source Oral, resp. rate 18, SpO2 94 %.  There were no vitals filed for this visit.  Labs & Radiologic Studies     CBC  Recent Labs  10/16/15 1536  WBC 5.0  HGB 10.5*  HCT 31.6*  MCV 89.3  PLT [id number]   Basic Metabolic Panel  Recent Labs  10/16/15 1536 10/17/15 0525  NA 139 139  K 4.3 4.2  CL 110 111  CO2 21* 22  GLUCOSE 128* 91  BUN 32* 28*  CREATININE 1.68* 1.58*  CALCIUM 9.5 9.3    Ct Abdomen Pelvis Wo Contrast  10/09/2015  CLINICAL DATA:  Nausea and vomiting with right-sided abdominal pain. EXAM: CT ABDOMEN AND PELVIS WITHOUT CONTRAST TECHNIQUE: Multidetector CT imaging of the abdomen and pelvis was performed following the standard protocol without IV contrast. COMPARISON:  12/21/2007 CT FINDINGS: Lower chest and abdominal wall: Esophagectomy with gastric pull-through, distended in the setting of obstruction. Small left pleural effusion. Hepatobiliary: No focal liver abnormality.Cholelithiasis without  inflammatory or obstructive change. Pancreas: Unremarkable. Spleen: Unremarkable. Adrenals/Urinary Tract: Negative adrenals. No hydronephrosis. Punctate right nephrolithiasis. Smooth bilateral renal atrophy with simple appearing upper pole cyst Unremarkable bladder. Reproductive:Symmetric enlargement of the prostate Stomach/Bowel: Dilated and fluid-filled small bowel leading to a transition point in the pelvis  related to a left groin hernia which is favored femoral. The deep inguinal ring appears more cranially positioned. There has been a right inguinal hernia repair; suspect previous left inguinal hernia repair as well. The herniated bowel has peripheral high-density, question mural hemorrhage - the contrast has not reached this point. No definitive signs of bowel necrosis/ perforation. Extensive colonic diverticulosis. No appendicitis. Vascular/Lymphatic: Abdominal aortic aneurysm status post stent repair. The aneurysm sac is decompressed compared to 2009. No mass or adenopathy. Peritoneal: No ascites or pneumoperitoneum. Musculoskeletal: Roughly 33 mm partly calcified mass anterior to the right hip, long-standing and likely complicated bursitis. Total right hip arthroplasty. Lumbar degenerative disc disease with dextroscoliosis. These results were called by telephone at the time of interpretation on 10/09/2015 at 7:46 am to Dr. Jola Schmidt , who verbally acknowledged these results. IMPRESSION: 1. Incarcerated appearing left groin hernia, favored femoral, with high-grade small bowel obstruction. The wall of the herniated bowel is high density and may be hemorrhagic. 2. Multiple chronic findings compared to 2009, described above. Electronically Signed   By: Monte Fantasia M.D.   On: 10/09/2015 07:45   Dg Chest 2 View  10/09/2015  CLINICAL DATA:  Shortness of breath for 3 weeks. EXAM: CHEST  2 VIEW COMPARISON:  08/14/2012 FINDINGS: Mild cardiac enlargement. No pulmonary vascular congestion. Pulmonary hyperinflation suggesting emphysema. Blunting of the left costophrenic angle suggesting fluid or thickened pleura. No focal consolidation or airspace disease. No pneumothorax. Calcified and tortuous aorta. Surgical clips in the base of the neck. Mild degenerative changes in the spine. IMPRESSION: Fluid or thickened pleura in the left costophrenic angle. Mild cardiac enlargement. Emphysematous changes in the lungs.  Electronically Signed   By: Lucienne Capers M.D.   On: 10/09/2015 00:00   Dg Abd Portable 1v  10/09/2015  CLINICAL DATA:  NG tube placement EXAM: PORTABLE ABDOMEN - 1 VIEW COMPARISON:  10/09/2015 CT abdomen FINDINGS: There is a nasogastric tube with the tip projecting over the distal esophagus ; recommend advancing the nasogastric tube 13 cm. There are multiple dilated loops of small bowel consistent with obstruction. There is no evidence of pneumoperitoneum, portal venous gas or pneumatosis. There are no pathologic calcifications along the expected course of the ureters. There is a right hip arthroplasty. There is a dextroscoliosis of the lumbar spine. IMPRESSION: 1. There is a gas tube with the tip projecting over the distal esophagus ; recommend advancing the nasogastric tube 13 cm. 2. Findings consistent with small-bowel obstruction. Electronically Signed   By: Kathreen Devoid   On: 10/09/2015 10:05    Disposition   Pt is being discharged home today in good condition.  Follow-up Plans & Appointments  Patient will be seen by CVTS outpatient.   Follow up with Dr. Radford Pax in 2 months.      Discharge Medications   Current Discharge Medication List    CONTINUE these medications which have NOT CHANGED   Details  amLODipine (NORVASC) 5 MG tablet Take 1 tablet (5 mg total) by mouth daily. Qty: 30 tablet, Refills: 5    aspirin 81 MG tablet Take 81 mg by mouth daily.    Docusate Calcium (STOOL SOFTENER PO) Take 2 tablets by mouth daily.  hydrochlorothiazide (MICROZIDE) 12.5 MG capsule Take 1 capsule (12.5 mg total) by mouth daily. Qty: 90 capsule, Refills: 3    levothyroxine (SYNTHROID, LEVOTHROID) 88 MCG tablet Take 1 tablet (88 mcg total) by mouth daily. Qty: 90 tablet, Refills: 2    mirtazapine (REMERON) 15 MG tablet TAKE 1 TABLET AT BEDTIME Qty: 90 tablet, Refills: 2    Omega-3 Fatty Acids (FISH OIL PO) Take 1 capsule by mouth daily.    omeprazole (PRILOSEC) 20 MG capsule TAKE  1 CAPSULE DAILY Qty: 90 capsule, Refills: 2    tamsulosin (FLOMAX) 0.4 MG CAPS capsule TAKE 1 CAPSULE (0.4 MG TOTAL) BY MOUTH DAILY AFTER SUPPER. Qty: 90 capsule, Refills: 3    traMADol (ULTRAM) 50 MG tablet Take 1-2 tablets (50-100 mg total) by mouth every 6 (six) hours as needed for severe pain. Qty: 15 tablet, Refills: 0            Outstanding Labs/Studies    Duration of Discharge Encounter   Greater than 30 minutes including physician time.  Signed, Arbutus Leas NP 10/17/2015, 6:12 PM

## 2015-10-17 NOTE — Interval H&P Note (Signed)
History and Physical Interval Note:  10/17/2015 8:42 AM  Isaiah Graham  has presented today for surgery, with the diagnosis of aortic stenosos  The various methods of treatment have been discussed with the patient and family. After consideration of risks, benefits and other options for treatment, the patient has consented to  Procedure(s): Right/Left Heart Cath and Coronary Angiography (N/A) as a surgical intervention .  The patient's history has been reviewed, patient examined, no change in status, stable for surgery.  I have reviewed the patient's chart and labs.  Questions were answered to the patient's satisfaction.    Cath Lab Visit (complete for each Cath Lab visit)  Clinical Evaluation Leading to the Procedure:   ACS: No.  Non-ACS:    Anginal Classification: CCS III  Anti-ischemic medical therapy: Minimal Therapy (1 class of medications)  Non-Invasive Test Results: No non-invasive testing performed  Prior CABG: No previous CABG       Isaiah Graham

## 2015-10-17 NOTE — Telephone Encounter (Signed)
Isaiah Graham is requesting a RX for a lift chair so Medicare will pay. Please call daughter when ready for pick up. Pt has a pending with Dr. Rica Mast in the next 2-3 weeks.

## 2015-10-17 NOTE — Progress Notes (Signed)
10/17/2015 1600 TR band has been removed, pressure dressing applied.  No new bleeding seen. Carney Corners

## 2015-10-17 NOTE — Progress Notes (Signed)
10/17/2015 7:01 PM Discharge AVS meds taken today and those due this evening reviewed.  Follow-up appointments and when to call md reviewed.  D/C IV and TELE.  Questions and concerns addressed.   D/C home per orders. Carney Corners

## 2015-10-17 NOTE — Progress Notes (Signed)
Site area: rt ac venous sheath Site Prior to Removal:  Level 0 Pressure Applied For:  15 minutes Manual:   yes Patient Status During Pull:  stable Post Pull Site:  Level  0 Post Pull Instructions Given:  yes Post Pull Pulses Present: yes Dressing Applied:  tegaderm Bedrest begins @ [id number] Comments:

## 2015-10-17 NOTE — Progress Notes (Signed)
10/17/2015 1145 15 minutes after all air was taken out of TR band the pt. Began to bleed.  Pressure was held for 20 min., 6cc's of air put back in band.   Carney Corners

## 2015-10-17 NOTE — Consult Note (Addendum)
   [facility] CM Inpatient Consult   10/17/2015  KAELUB BLITCH [birth year] [id number]   Patient active with [place] Management services. Recently signed up with [place] Management program. Made inpatient RNCM aware that patient is active with [place] Management program. Will continue to follow.   Addendum: Spoke with Mr. Daws at bedside. He is agreeable to ongoing [place] Management follow up. He reports he thinks he is discharging soon. Will send notification to [place].  Marthenia Rolling, MSN-Ed, RN,BSN [facility] Liaison [phone]

## 2015-10-17 NOTE — Progress Notes (Signed)
Patient is currently Active with [place] for P.T.

## 2015-10-18 ENCOUNTER — Other Ambulatory Visit: Payer: Self-pay

## 2015-10-18 NOTE — Telephone Encounter (Signed)
Written.

## 2015-10-18 NOTE — Patient Outreach (Signed)
[facility]) Care Management  10/18/2015  RONDELL JOHNKE July 08, 1929 [id number]  RNCM called to complete transition of care. Member was not home. HIPPA compliant message left.  Plan: continue to try to reach member.  Thea Silversmith, RN, MSN, [place] Coordinator Cell: [phone]

## 2015-10-18 NOTE — Telephone Encounter (Signed)
Jana Half is aware that RX is up front for pick up. Pending appt Monday to look at bed sores.

## 2015-10-19 ENCOUNTER — Other Ambulatory Visit: Payer: Self-pay | Admitting: *Deleted

## 2015-10-19 NOTE — Patient Outreach (Signed)
Transition of care call placed to member.  He state he has been doing "alright" since his discharge.  He denies any change in his medications and report taking them as prescribed.  He has a follow up appointment with his PCP on Monday and one with the heart surgeon on Thursday to address his aortic stenosis.  He denies needing transportation, stating his daughter will take him.  He denies any concerns/questions at this time.  Will update assigned care manager upon return to continue transition of care calls next week.  Valente David, BSN, [place] Management  [facility] Care Manager [phone]

## 2015-10-22 ENCOUNTER — Encounter: Payer: Self-pay | Admitting: Family Medicine

## 2015-10-22 ENCOUNTER — Ambulatory Visit (INDEPENDENT_AMBULATORY_CARE_PROVIDER_SITE_OTHER): Payer: Medicare Other | Admitting: Family Medicine

## 2015-10-22 VITALS — BP 120/60 | HR 64 | Ht 67.0 in | Wt 116.4 lb

## 2015-10-22 DIAGNOSIS — L89322 Pressure ulcer of left buttock, stage 2: Secondary | ICD-10-CM

## 2015-10-22 DIAGNOSIS — L89311 Pressure ulcer of right buttock, stage 1: Secondary | ICD-10-CM | POA: Diagnosis not present

## 2015-10-22 DIAGNOSIS — R634 Abnormal weight loss: Secondary | ICD-10-CM | POA: Insufficient documentation

## 2015-10-22 DIAGNOSIS — I351 Nonrheumatic aortic (valve) insufficiency: Secondary | ICD-10-CM | POA: Diagnosis not present

## 2015-10-22 DIAGNOSIS — R627 Adult failure to thrive: Secondary | ICD-10-CM

## 2015-10-22 NOTE — Progress Notes (Signed)
Subjective:    Patient ID: Isaiah Graham, male    DOB: [birth year], 82 y.o.   MRN: [id number]  HPI Patient has had recent failure to thrive. He was seen by me back in March with increased fatigue and much less activity than usual. Denied any chest pain. He had prominent systolic heart murmur. We referred for echocardiogram which showed aortic stenosis with systolic dysfunction with estimated ejection fraction 20-25%. He had global hypokinesis with akinesis of the inferior lateral wall  Subsequently seen by cardiology with catheterization showing severe two-vessel CAD with total occlusion of the RCA and severe stenosis of LAD. He had evidence for severe aortic insufficiency. Suspected heart failure related to combination of CAD and aortic insufficiency. He has pending follow-up with cardiothoracic surgery.  He has continued to lose some weight since we saw him in March. He states he is eating fairly well though not much at each meal He denies any chest pain currently. No dyspnea at rest.  Recently, they noted recurrent decubitus ulcer left buttock No urine or stool incontinence. Spend much of his day sitting  Past Medical History  Diagnosis Date  . Hypertension   . CKD (chronic kidney disease) stage 4, GFR 15-29 ml/min (HCC)   . Esophageal cancer ([place]) 1990  . Hypothyroidism   . Inguinal hernia February 2013    right  . Esophageal stricture   . Food impaction of esophagus   . Barrett esophagus   . Gastroparesis   . Aortic aneurysm, abdominal (HCC)     repair  . Arthritis   . Aortic stenosis 09/17/2015    a. Echo 08/2015: Moderate AS  . Dilated cardiomyopathy ([place]) 09/17/2015    a. Echo 08/2015: EF 20-25% w. global HK and inferolateral AK   Past Surgical History  Procedure Laterality Date  . Sp endo repair infraren aaa  8/09    es  . Coarctation of aorta repair  2009  . Inguinal hernia repair  09/01/11    bilateral  . Esophagogastroduodenoscopy  10/05/2011   Procedure: ESOPHAGOGASTRODUODENOSCOPY (EGD);  Surgeon: Ladene Artist, MD,FACG;  Location: [facility] ENDOSCOPY;  Service: Endoscopy;  Laterality: N/A;  . Vagotomy    . Esophagectomy      partial 1997  . Umbilical hernia repair    . Total hip arthroplasty Right 08/06/2012    Procedure: RIGHT TOTAL HIP ARTHOPLASTY WITH ACETABULAR AUTOGRAFT;  Surgeon: Gearlean Alf, MD;  Location: [facility] ORS;  Service: Orthopedics;  Laterality: Right;  RIGHT TOTAL HIP ARTHOPLASTY WITH ACETABULAR AUTOGRAFT   . Inguinal hernia repair Left 10/09/2015    Procedure: open femoral hernia repair;  Surgeon: Mickeal Skinner, MD;  Location: [facility];  Service: General;  Laterality: Left;  . Cardiac catheterization N/A 10/17/2015    Procedure: Right/Left Heart Cath and Coronary Angiography;  Surgeon: Sherren Mocha, MD;  Location: [facility] CV LAB;  Service: Cardiovascular;  Laterality: N/A;    reports that he quit smoking about 44 years ago. His smoking use included Cigarettes. He has a 11 pack-year smoking history. He has never used smokeless tobacco. He reports that he does not drink alcohol or use illicit drugs. family history includes Aneurysm in his daughter; Arthritis/Rheumatoid in his mother; Glaucoma in his sister; Pancreatic cancer in his father. Allergies  Allergen Reactions  . Morphine And Related Other (See Comments)    Intolerance - hallucinations, delusions on high doses per daughter      Review of Systems  Constitutional: Positive for fatigue and unexpected weight  change. Negative for fever and chills.  HENT: Negative for trouble swallowing.   Eyes: Negative for visual disturbance.  Respiratory: Negative for cough, chest tightness and wheezing.   Cardiovascular: Negative for chest pain, palpitations and leg swelling.  Gastrointestinal: Negative for nausea, vomiting, abdominal pain and diarrhea.  Endocrine: Negative for polydipsia and polyuria.  Neurological: Negative for dizziness, syncope, weakness,  light-headedness and headaches.       Objective:   Physical Exam  Constitutional: He is oriented to person, place, and time. He appears well-developed and well-nourished.  Neck: Neck supple.  Cardiovascular: Normal rate and regular rhythm.   Pulmonary/Chest: Effort normal and breath sounds normal. No respiratory distress. He has no wheezes. He has no rales.  Musculoskeletal: He exhibits no edema.  Neurological: He is alert and oriented to person, place, and time.  Skin:  Stage I decubitus right buttock with diffuse erythema. He has stage II decubitus left buttock approximately 1 cm diameter. Noted chronic tissue. No evidence for infection          Assessment & Plan:  #1 failure to thrive. He's had gradual weight loss in recent months and decreased activity levels. Suggest adding nutritional supplement such as Glucerna.  #2 stage I and stage II decubitus ulcers of the buttock as above. We applied Tegaderm to the left buttock ulcer here and will set up home health nursing referral for wound care assessment  #3 CAD with cardiomyopathy and severe aortic insufficiency. He has pending follow-up with cardiothoracic surgery but is probably a very poor surgical candidate at this time  Eulas Post MD [facility]

## 2015-10-22 NOTE — Progress Notes (Signed)
Pre visit review using our clinic review tool, if applicable. No additional management support is needed unless otherwise documented below in the visit note. 

## 2015-10-22 NOTE — Patient Instructions (Signed)
Pressure Injury °A pressure injury, sometimes called a bedsore, is an injury to the skin and underlying tissue caused by pressure. Pressure on blood vessels causes decreased blood flow to the skin, which can eventually cause the skin tissue to die and break down into a wound. °Pressure injuries usually occur: °· Over bony parts of the body such as the tailbone, shoulders, elbows, hips, and heels. °· Under medical devices such as respiratory equipment, stockings, tubes, and splints. °Pressure injuries start as reddened areas on the skin and can lead to pain, muscle damage, and infection. Pressure injuries can vary in severity.  °CAUSES °Pressure injuries are caused by a lack of blood supply to an area of skin. They can occur from intense pressure over a short period of time or from less intense pressure over a long period of time. °RISK FACTORS °This condition is more likely to develop in people who: °· Are in the hospital or an extended care facility. °· Are bedridden or in a wheelchair. °· Have an injury or disease that keeps them from: °¨ Moving normally. °¨ Feeling pain or pressure. °· Have a condition that: °¨ Makes them sleepy or less alert. °¨ Causes poor blood flow. °· Need to wear a medical device. °· Have poor control of their bladder or bowel functions (incontinence). °· Have poor nutrition (malnutrition). °· Are of certain ethnicities. People of African American and Latino or Hispanic descent are at higher risk compared to other ethnic groups. °If you are at risk for pressure ulcers, your health care provider may recommend certain types of bedding to help prevent them. These may include foam or gel mattresses covered with one of the following: °· A sheepskin blanket. °· A pad that is filled with gel, air, water, or foam. °SYMPTOMS  °The main symptom is a blister or change in skin color that opens into a wound. Other symptoms include:  °· Red or dark areas of skin that do not turn white or pale when  pressed with a finger.   °· Pain, warmth, or change of skin texture.   °DIAGNOSIS °This condition is diagnosed with a medical history and physical exam. You may also have tests, including:  °· Blood tests to check for infection or signs of poor nutrition. °· Imaging studies to check for damage to the deep tissues under your skin. °· Blood flow studies. °Your pressure injury will be staged to determine its severity. Staging is an assessment of: °· The depth of the pressure injury. °· Which tissues are exposed because of the pressure injury. °· The causes of the pressure injury.  °TREATMENT °The main focus of treatment is to help your injury heal. This may be done by:  °· Relieving or redistributing pressure on your skin. This includes: °¨ Frequently changing your position. °¨ Eliminating or minimizing positions that caused the wound or that can make the wound worse. °¨ Using specific bed mattresses and chair cushions. °¨ Refitting, resizing, or replacing any medical devices, or padding the skin under them. °¨ Using creams or powders to prevent rubbing (friction) on the skin. °· Keeping your skin clean and dry. This may include using a skin cleanser or skin protectant as told by your health care provider. This may be a lotion, ointment, or spray. °· Cleaning your injury and removing any dead tissue from the wound (debridement). °· Placing a bandage (dressing) over your injury. °· Preventing or treating infection. This may include antibiotic, antimicrobial, or antiseptic medicines. °Treatment may also include medicine for pain. °  Sometimes surgery is needed to close the wound with a flap of healthy skin or a piece of skin from another area of your body (graft). You may need surgery if other treatments are not working or if your injury is very deep. °HOME CARE INSTRUCTIONS °Wound Care °· Follow instructions from your health care provider about: °¨ How to take care of your wound. °¨ When and how you should change your  dressing. °¨ When you should remove your dressing. If your dressing is dry and stuck when you try to remove it, moisten or wet the dressing with saline or water so that it can be removed without harming your skin or wound tissue. °· Check your wound every day for signs of infection. Have a caregiver do this for you if you are not able. Watch for: °¨ More redness, swelling, or pain. °¨ More fluid, blood, or pus. °¨ A bad smell. °Skin Care °· Keep your skin clean and dry. Gently pat your skin dry. °· Do not rub or massage your skin. °· Use a skin protectant only as told by your health care provider. °· Check your skin every day for any changes in color or any new blisters or sores (ulcers). Have a caregiver do this for you if you are not able. °Medicines  °· Take over-the-counter and prescription medicines only as told by your health care provider. °· If you were prescribed an antibiotic medicine, take it or apply it as told by your health care provider. Do not stop taking or using the antibiotic even if your condition improves. °Reducing and Redistributing Pressure °· Do not lie or sit in one position for a long time. Move or change position every two hours or as told by your health care provider. °· Use pillows or cushions to reduce pressure. Ask your health care provider to recommend cushions or pads for you. °· Use medical devices that do not rub your skin. Tell your health care provider if one of your medical devices is causing a pressure injury to develop. °General Instructions °· Eat a healthy diet that includes lots of protein. Ask your health care provider for diet advice. °· Drink enough fluid to keep your urine clear or pale yellow. °· Be as active as you can every day. Ask your health care provider to suggest safe exercises or activities. °· Do not abuse drugs or alcohol. °· Keep all follow-up visits as told by your health care provider. This is important. °· Do not smoke. °SEEK MEDICAL CARE IF: °· You  have chills or fever. °· Your pain medicine is not helping. °· You have any changes in skin color. °· You have new blisters or sores. °· You develop warmth, redness, or swelling near a pressure injury. °· You have a bad odor or pus coming from your pressure injury. °· You lose control of your bowels or bladder. °· You develop new symptoms. °· Your wound does not improve after 1-2 weeks of treatment. °· You develop a new medical condition, such as diabetes, peripheral vascular disease, or conditions that affect your defense (immune) system. °  °This information is not intended to replace advice given to you by your health care provider. Make sure you discuss any questions you have with your health care provider. °  °Document Released: 06/09/2005 Document Revised: 02/28/2015 Document Reviewed: 10/18/2014 °Elsevier Interactive Patient Education ©2016 [employer] ° °

## 2015-10-23 ENCOUNTER — Other Ambulatory Visit: Payer: Self-pay

## 2015-10-23 NOTE — Patient Outreach (Addendum)
[facility]) Care Management  10/23/2015  Isaiah Graham [birth year] [id number]  RNCM called to complete transition of care call. Member hospitalized for aortic stenosis/heart failure. Per note member has saw primary care on 5/1: member has decubitus ulcer-home health ordered, weight loss/failure to thrive-recommending Glucerna. Member's wife answered the phone and stated that member was not there. HIPPA compliant message left. Wife reports she will request member to return call.  Plan: await return call and follow up next week if no return call.  Thea Silversmith, RN, MSN, [place] Coordinator Cell: [phone]

## 2015-10-24 ENCOUNTER — Telehealth: Payer: Self-pay | Admitting: Family Medicine

## 2015-10-24 NOTE — Telephone Encounter (Signed)
Okay 

## 2015-10-24 NOTE — Telephone Encounter (Signed)
Need verbal wound order for duoderm change once a week for pts ulcers on the buttocks.

## 2015-10-25 ENCOUNTER — Institutional Professional Consult (permissible substitution) (INDEPENDENT_AMBULATORY_CARE_PROVIDER_SITE_OTHER): Payer: Medicare Other | Admitting: Cardiothoracic Surgery

## 2015-10-25 ENCOUNTER — Encounter: Payer: Self-pay | Admitting: Cardiothoracic Surgery

## 2015-10-25 VITALS — BP 140/63 | HR 56 | Resp 20 | Ht 67.0 in | Wt 112.0 lb

## 2015-10-25 DIAGNOSIS — I351 Nonrheumatic aortic (valve) insufficiency: Secondary | ICD-10-CM

## 2015-10-25 DIAGNOSIS — I2510 Atherosclerotic heart disease of native coronary artery without angina pectoris: Secondary | ICD-10-CM

## 2015-10-25 DIAGNOSIS — I509 Heart failure, unspecified: Secondary | ICD-10-CM | POA: Diagnosis not present

## 2015-10-25 NOTE — Telephone Encounter (Signed)
yes

## 2015-10-25 NOTE — Telephone Encounter (Signed)
Isaiah Graham is aware of the verbal orders.

## 2015-10-25 NOTE — Progress Notes (Signed)
[address]       [place] [address]             [phone]                    Isaiah Graham [facility] Medical Record [id number] Date of Birth: 04-10-1930  Referring: Sherren Mocha, MD Primary Care: Eulas Post, MD  Chief Complaint:    Chief Complaint  Patient presents with  . Aortic Insuffiency    Surgical eval, Cardiac Cath 10/17/15, ECHO 09/13/15, stress test 09/20/15  . Coronary Artery Disease    History of Present Illness:    Isaiah Graham 82 y.o. male is seen in the office  today for  At the request of Dr. Burt Knack for severe LV dysfunction aortic insufficiency aortic stenosis and coronary occlusive disease.  The patient was just recently discharged from the hospital for repair of left inguinal hernia with incarceration.  The patient notes that for the past 6 months he had become increasingly short of breath with exhaustion and feeling cold.  A change in a previously noted murmur lead II further evaluation by cardiology including echocardiogram stress echo cardiac catheterization.  The patient was thought not to be a candidate for open cardiac surgery and was being evaluated for potential TVAR placement.    Patient is currently staying at home with home nursing care because of decubiti ulcers obtained while in the hospital recently.  His daughter notes that he's had significant weight loss over the past 6 months.    his previous history significant for transhiatal total esophagectomy in 1996 for carcinoma of the esophagus and recent left inguinal hernia repair for incarceration he's also had open abdominal aortic aneurysm repair in 2009.  2 years ago he had right hip replacement    Current Activity/ Functional Status:  Patient is independent with mobility/ambulation, transfers, ADL's, IADL's.   Zubrod Score: At the time of surgery this patient's most appropriate activity status/level should be described as: []     0    Normal  activity, no symptoms []     1    Restricted in physical strenuous activity but ambulatory, able to do out light work []     2    Ambulatory and capable of self care, unable to do work activities, up and about               >50 % of waking hours                              [x]     3    Only limited self care, in bed greater than 50% of waking hours []     4    Completely disabled, no self care, confined to bed or chair []     5    Moribund   Past Medical History  Diagnosis Date  . Hypertension   . CKD (chronic kidney disease) stage 4, GFR 15-29 ml/min (HCC)   . Esophageal cancer ([place]) 1990  . Hypothyroidism   . Inguinal hernia February 2013    right  . Esophageal stricture   . Food impaction of esophagus   . Barrett esophagus   . Gastroparesis   . Aortic aneurysm, abdominal (HCC)     repair  . Arthritis   . Aortic stenosis 09/17/2015    a. Echo 08/2015: Moderate AS  . Dilated cardiomyopathy ([place]) 09/17/2015  a. Echo 08/2015: EF 20-25% w. global HK and inferolateral AK    Past Surgical History  Procedure Laterality Date  . Sp endo repair infraren aaa  8/09    es  . Coarctation of aorta repair  2009  . Inguinal hernia repair  09/01/11    bilateral  . Esophagogastroduodenoscopy  10/05/2011    Procedure: ESOPHAGOGASTRODUODENOSCOPY (EGD);  Surgeon: Ladene Artist, MD,FACG;  Location: [facility] ENDOSCOPY;  Service: Endoscopy;  Laterality: N/A;  . Vagotomy    . Esophagectomy      partial 1997  . Umbilical hernia repair    . Total hip arthroplasty Right 08/06/2012    Procedure: RIGHT TOTAL HIP ARTHOPLASTY WITH ACETABULAR AUTOGRAFT;  Surgeon: Gearlean Alf, MD;  Location: [facility] ORS;  Service: Orthopedics;  Laterality: Right;  RIGHT TOTAL HIP ARTHOPLASTY WITH ACETABULAR AUTOGRAFT   . Inguinal hernia repair Left 10/09/2015    Procedure: open femoral hernia repair;  Surgeon: Mickeal Skinner, MD;  Location: [facility];  Service: General;  Laterality: Left;  . Cardiac catheterization N/A 10/17/2015      Procedure: Right/Left Heart Cath and Coronary Angiography;  Surgeon: Sherren Mocha, MD;  Location: [facility];  Service: Cardiovascular;  Laterality: N/A;    Family History  Problem Relation Age of Onset  . Pancreatic cancer Father   . Aneurysm Daughter     brain  . Glaucoma Sister   . Arthritis/Rheumatoid Mother     Social History   Social History  . Marital Status: Married    Spouse Name: N/A  . Number of Children: 5  . Years of Education: N/A   Occupational History  . retired    Social History Main Topics  . Smoking status: Former Smoker -- 0.50 packs/day for 22 years    Types: Cigarettes    Quit date: 12/04/1970  . Smokeless tobacco: Never Used  . Alcohol Use: No  . Drug Use: No  . Sexual Activity: Not on file   Other Topics Concern  . Not on file   Social History Narrative    History  Smoking status  . Former Smoker -- 0.50 packs/day for 22 years  . Types: Cigarettes  . Quit date: 12/04/1970  Smokeless tobacco  . Never Used    History  Alcohol Use No     Allergies  Allergen Reactions  . Morphine And Related Other (See Comments)    Intolerance - hallucinations, delusions on high doses per daughter    Current Outpatient Prescriptions  Medication Sig Dispense Refill  . amLODipine (NORVASC) 5 MG tablet Take 1 tablet (5 mg total) by mouth daily. 30 tablet 5  . aspirin 81 MG tablet Take 81 mg by mouth daily.    [name] Calcium (STOOL SOFTENER PO) Take 2 tablets by mouth daily.    . hydrochlorothiazide (MICROZIDE) 12.5 MG capsule Take 1 capsule (12.5 mg total) by mouth daily. 90 capsule 3  . levothyroxine (SYNTHROID, LEVOTHROID) 88 MCG tablet Take 1 tablet (88 mcg total) by mouth daily. 90 tablet 2  . mirtazapine (REMERON) 15 MG tablet TAKE 1 TABLET AT BEDTIME 90 tablet 2  . Omega-3 Fatty Acids (FISH OIL PO) Take 1 capsule by mouth daily.    Marland Kitchen omeprazole (PRILOSEC) 20 MG capsule TAKE 1 CAPSULE DAILY 90 capsule 2  . tamsulosin (FLOMAX)  0.4 MG CAPS capsule TAKE 1 CAPSULE (0.4 MG TOTAL) BY MOUTH DAILY AFTER SUPPER. 90 capsule 3  . traMADol (ULTRAM) 50 MG tablet Take 1-2 tablets (50-100  mg total) by mouth every 6 (six) hours as needed for severe pain. 15 tablet 0   No current facility-administered medications for this visit.      Review of Systems:     Cardiac Review of Systems: Y or N  Chest Pain [ n   ]  Resting SOB Blue.Reese   ] Exertional SOB  [ y ]  Orthopnea [ y ]   Pedal Edema Blue.Reese   ]    Palpitations Florencio.Farrier  ] Syncope  [ n ]   Presyncope [n   ]  General Review of Systems: [Y] = yes [  ]=no Constitional: recent weight change Blue.Reese  ];  Wt loss over the last 3 months [  ? ] anorexia [ y ]; fatigue [ y ]; nausea [  ]; night sweats [n  ]; fever [n ]; or chills [  ];          Dental: poor dentition[ n ]; Last Dentist visit:   Eye : blurred vision [  ]; diplopia [   ]; vision changes [  ];  Amaurosis fugax[  ]; Resp: cough [n  ];  wheezing[ n ];  hemoptysis[ n ]; shortness of breath[ y ]; paroxysmal nocturnal dyspnea[y  ]; dyspnea on exertion[y  ]; or orthopnea[ y ];  GI:  gallstones[  ], vomiting[  ];  dysphagia[  ]; melena[  ];  hematochezia [  ]; heartburn[  ];   Hx of  Colonoscopy[  ]; GU: kidney stones [  ]; hematuria[  ];   dysuria [  ];  nocturia[  ];  history of     obstruction [  ]; urinary frequency [  ]             Skin: rash, swelling[  ];, hair loss[  ];  peripheral edema[  ];  or itching[  ]; Musculosketetal: myalgias[  ];  joint swelling[  ];  joint erythema[  ];  joint pain[  ];  back pain[  ];  Heme/Lymph: bruising[  ];  bleeding[  ];  anemia[  ];  Neuro: TIA[ n ];  headaches[  ];  stroke[  ];  vertigo[  ];  seizures[  ];   paresthesias[  ];  difficulty walking[y  ];  Psych:depression[  ]; anxiety[  ];  Endocrine: diabetes[  ];  thyroid dysfunction[  ];  Immunizations: Flu up to date Florencio.Farrier  ]; Pneumococcal up to date [ n ];  Other:  Physical Exam: BP 140/63 mmHg  Pulse 56  Resp 20  Ht 5\' 7"  (1.702 m)  Wt 112 lb  (50.803 kg)  BMI 17.54 kg/m2  SpO2 97%  PHYSICAL EXAMINATION: overall the patient is very cachectic and frail-appearing he is able to sit on the exam table but appears very weak.  General appearance: alert, cooperative, appears older than stated age, cachectic and no distress Head: Normocephalic, without obvious abnormality, atraumatic Neck: no adenopathy, no carotid bruit, no JVD, supple, symmetrical, trachea midline and thyroid not enlarged, symmetric, no tenderness/mass/nodules Lymph nodes: Cervical, supraclavicular, and axillary nodes normal. Resp: clear to auscultation bilaterally Back: symmetric, no curvature. ROM normal. No CVA tenderness. Cardio: systolic murmur: holosystolic 2/6, crescendo at 2nd right intercostal space and diastolic murmur: early diastolic 3/6, crescendo at apex GI: soft, non-tender; bowel sounds normal; no masses,  no organomegaly Extremities: extremities normal, atraumatic, no cyanosis or edema and Homans sign is negative, no sign of DVT Neurologic: Grossly normal  left groin incision from rest  recent surgical repair  healed abdominal midline incision from open aortic repair  Diagnostic Studies & Laboratory data:     Recent Radiology Findings:   Ct Abdomen Pelvis Wo Contrast  10/09/2015  CLINICAL DATA:  Nausea and vomiting with right-sided abdominal pain. EXAM: CT ABDOMEN AND PELVIS WITHOUT CONTRAST TECHNIQUE: Multidetector CT imaging of the abdomen and pelvis was performed following the standard protocol without IV contrast. COMPARISON:  12/21/2007 CT FINDINGS: Lower chest and abdominal wall: Esophagectomy with gastric pull-through, distended in the setting of obstruction. Small left pleural effusion. Hepatobiliary: No focal liver abnormality.Cholelithiasis without inflammatory or obstructive change. Pancreas: Unremarkable. Spleen: Unremarkable. Adrenals/Urinary Tract: Negative adrenals. No hydronephrosis. Punctate right nephrolithiasis. Smooth bilateral renal  atrophy with simple appearing upper pole cyst Unremarkable bladder. Reproductive:Symmetric enlargement of the prostate Stomach/Bowel: Dilated and fluid-filled small bowel leading to a transition point in the pelvis related to a left groin hernia which is favored femoral. The deep inguinal ring appears more cranially positioned. There has been a right inguinal hernia repair; suspect previous left inguinal hernia repair as well. The herniated bowel has peripheral high-density, question mural hemorrhage - the contrast has not reached this point. No definitive signs of bowel necrosis/ perforation. Extensive colonic diverticulosis. No appendicitis. Vascular/Lymphatic: Abdominal aortic aneurysm status post stent repair. The aneurysm sac is decompressed compared to 2009. No mass or adenopathy. Peritoneal: No ascites or pneumoperitoneum. Musculoskeletal: Roughly 33 mm partly calcified mass anterior to the right hip, long-standing and likely complicated bursitis. Total right hip arthroplasty. Lumbar degenerative disc disease with dextroscoliosis. These results were called by telephone at the time of interpretation on 10/09/2015 at 7:46 am to Dr. Jola Schmidt , who verbally acknowledged these results. IMPRESSION: 1. Incarcerated appearing left groin hernia, favored femoral, with high-grade small bowel obstruction. The wall of the herniated bowel is high density and may be hemorrhagic. 2. Multiple chronic findings compared to 2009, described above. Electronically Signed   By: Monte Fantasia M.D.   On: 10/09/2015 07:45   Dg Chest 2 View  10/09/2015  CLINICAL DATA:  Shortness of breath for 3 weeks. EXAM: CHEST  2 VIEW COMPARISON:  08/14/2012 FINDINGS: Mild cardiac enlargement. No pulmonary vascular congestion. Pulmonary hyperinflation suggesting emphysema. Blunting of the left costophrenic angle suggesting fluid or thickened pleura. No focal consolidation or airspace disease. No pneumothorax. Calcified and tortuous aorta.  Surgical clips in the base of the neck. Mild degenerative changes in the spine. IMPRESSION: Fluid or thickened pleura in the left costophrenic angle. Mild cardiac enlargement. Emphysematous changes in the lungs. Electronically Signed   By: Lucienne Capers M.D.   On: 10/09/2015 00:00   Dg Abd Portable 1v  10/09/2015  CLINICAL DATA:  NG tube placement EXAM: PORTABLE ABDOMEN - 1 VIEW COMPARISON:  10/09/2015 CT abdomen FINDINGS: There is a nasogastric tube with the tip projecting over the distal esophagus ; recommend advancing the nasogastric tube 13 cm. There are multiple dilated loops of small bowel consistent with obstruction. There is no evidence of pneumoperitoneum, portal venous gas or pneumatosis. There are no pathologic calcifications along the expected course of the ureters. There is a right hip arthroplasty. There is a dextroscoliosis of the lumbar spine. IMPRESSION: 1. There is a gas tube with the tip projecting over the distal esophagus ; recommend advancing the nasogastric tube 13 cm. 2. Findings consistent with small-bowel obstruction. Electronically Signed   By: Kathreen Devoid   On: 10/09/2015 10:05     I have independently reviewed the above radiologic studies.  Recent  Lab Findings: Lab Results  Component Value Date   WBC 5.0 10/16/2015   HGB 10.5* 10/16/2015   HCT 31.6* 10/16/2015   PLT 172 10/16/2015   GLUCOSE 91 10/17/2015   CHOL 196 01/31/2013   TRIG 94.0 01/31/2013   HDL 46.20 01/31/2013   LDLDIRECT 143.0 12/04/2010   LDLCALC 131* 01/31/2013   ALT 10* 10/10/2015   AST 22 10/10/2015   NA 139 10/17/2015   K 4.2 10/17/2015   CL 111 10/17/2015   CREATININE 1.58* 10/17/2015   BUN 28* 10/17/2015   CO2 22 10/17/2015   TSH 2.754 10/09/2015   INR 1.24 10/16/2015   HGBA1C 5.7* 10/11/2015    Chronic Kidney Disease   Stage I     GFR >90  Stage II    GFR 60-89  Stage IIIA GFR 45-59  Stage IIIB GFR 30-44  Stage IV   GFR 15-29  Stage V    GFR  <15  Lab Results  Component  Value Date   CREATININE 1.58* 10/17/2015   Estimated Creatinine Clearance: 25.5 mL/min (by C-G formula based on Cr of 1.58).     CATH:   Prox LAD lesion, 80% stenosed.  Mid LAD lesion, 90% stenosed.  Mid Cx lesion, 50% stenosed.  Dist RCA lesion, 100% stenosed.  1. Severe 2 vessel CAD with total occlusion of the RCA and severe stenosis of the LAD with heavy diffuse coronary calcification 2. Dilated aortic root with severe aortic insufficiency 3. Minimal transaortic valve gradient on LV pullback, suggestive of no significant aortic stenosis  Suspect heart failure and cardiomyopathy due to a combination of coronary disease and aortic insufficiency as primary valve lesion. The aortic sinuses appear severely dilated on aortography and there is severe global LV systolic dysfunction with LVEF estimated < 30%.  Will refer to cardiac surgery for evaluation, but I suspect this patient will not be a candidate for surgical intervention.  ECHO: Study Conclusions  - Aortic valve: Possibly bicuspid; moderately thickened, moderately  calcified leaflets. Transvalvular velocity was increased more  than expected due to aortic insufficiency. There was moderate  stenosis. There was moderate to severe regurgitation directed  eccentrically in the LVOT.  Impressions:  - Moderate &quot;true&quot; aortic stenosis.  The jet is early peaking. While gradients increase appropriately  with inotrope administration, they do not reach severe aortic  stenosis range. Gradients are exaggerated due to aortic  insufficiency-related increase in stroke volume.  There is evidence of severe ischemic cardiomyopathy.  Increased cardiac output and increased aortic valve gradients  confirm the presence of contractile reserve, but 2D imaging was  not performed during inotrope administration. Unable to assess  viability in the areas of myocardial dysfunction. However,  baseline 2D images suggest  scarring in the inferior and  inferolateral walls, consistent with prior infarction in the  right coronary artery and left circumflex artery distribution.  Study Conclusions  - Left ventricle: The cavity size was severely dilated. Wall  thickness was normal. Systolic function was severely reduced. The  estimated ejection fraction was in the range of 20% to 25%.  Diffuse hypokinesis. There is akinesis of the inferolateral  myocardium. Doppler parameters are consistent with abnormal left  ventricular relaxation (grade 1 diastolic dysfunction). Doppler  parameters are consistent with high ventricular filling pressure. - Aortic valve: Valve mobility was mildly restricted. There was  moderate stenosis. There was moderate regurgitation. - Mitral valve: Calcified annulus. There was mild regurgitation. - Left atrium: The atrium was severely dilated. - Right atrium: The  atrium was mildly dilated. - Pulmonary arteries: Systolic pressure was moderately increased.  PA peak pressure: 54 mm Hg (S). - Pericardium, extracardiac: A trivial pericardial effusion was  identified. There was a left pleural effusion.  Impressions:  - Global hypokinesis with akinesis of the inferior lateral wall;  overall severely reduced LV function; grade 1 diastolic  dysfunction; severe LVE; calcified aortic valve with moderate AS  (AVA 1.1-1.2 cm2) and moderate AI; mild MR; severe LAE; mild RAE;  mild TR with moderately elevated pulmonary pressure; mildly  dilated aortic root (suggest CTA or MRA to further assess).  Left ventricle Value Reference LV ID, ED, PLAX chordal (H) 72.2 mm 43 - 52 LV ID, ES, PLAX chordal (H) 64.3 mm 23 - 38  Assessment / Plan:    severe aortic insufficiency and moderate aortic stenosis with severe LV dysfunction manifest by pulmonary hypertension and diastolic left ventricular diameter of 72  and significantly dilated aortic root on cardiac catheterization  in addition the patient has severe two-vessel coronary artery disease with chronic occlusion of the right coronary artery and high-grade stenosis proximally and distally in the LAD.  chronic renal insufficiency stage IIIB  severe malnutrition   I discussed with the patient and his daughter the risks and options related to react surgery at the patient's age underlying renal function and LV function.  Patient is a extremely poor candidate to make any kind of reasonable recovery following surgery,  Which would include [place] with aortic root replacement and coronary artery bypass grafting.        I  spent 40 minutes counseling the patient face to face and 50% or more the  time was spent in counseling and coordination of care. The total time spent in the appointment was 60 minutes.  Grace Isaac MD      [address] [place] [address] Office [phone]   Beeper [phone]  10/25/2015 1:48 PM

## 2015-11-01 ENCOUNTER — Ambulatory Visit (INDEPENDENT_AMBULATORY_CARE_PROVIDER_SITE_OTHER): Payer: Medicare Other | Admitting: Cardiovascular Disease

## 2015-11-01 ENCOUNTER — Encounter: Payer: Self-pay | Admitting: Cardiovascular Disease

## 2015-11-01 VITALS — BP 120/40 | HR 64 | Ht 67.0 in | Wt 116.6 lb

## 2015-11-01 DIAGNOSIS — I351 Nonrheumatic aortic (valve) insufficiency: Secondary | ICD-10-CM

## 2015-11-01 DIAGNOSIS — I5022 Chronic systolic (congestive) heart failure: Secondary | ICD-10-CM | POA: Diagnosis not present

## 2015-11-01 DIAGNOSIS — I2510 Atherosclerotic heart disease of native coronary artery without angina pectoris: Secondary | ICD-10-CM | POA: Diagnosis not present

## 2015-11-01 MED ORDER — NIFEDIPINE ER OSMOTIC RELEASE 30 MG PO TB24: 30.0000 mg | ORAL_TABLET | Freq: Every day | ORAL | Status: DC

## 2015-11-01 MED ORDER — FUROSEMIDE 20 MG PO TABS: 20.0000 mg | ORAL_TABLET | Freq: Every day | ORAL | Status: DC | PRN

## 2015-11-01 NOTE — Progress Notes (Signed)
Cardiology Office Note Date:  11/01/2015   ID:  Isaiah Graham, DOB [birth year], MRN [id number]  PCP:  Eulas Post, MD  Cardiologist:  Sherren Mocha, MD    Chief Complaint  Patient presents with  . chronic systolic heart failure    +sob/no cp,lee, or claudication     History of Present Illness: Isaiah Graham is a 82 y.o. male who presents for follow-up evaluation. The patient is followed for multiple medical and cardiovascular problems. He was recently diagnosed with severe aortic insufficiency, severe cardiomyopathy, and moderate aortic stenosis. He underwent right and left heart catheterization also demonstrating multivessel coronary artery disease. The patient has had general failure to thrive with protein calorie malnutrition and very poor functional status over the last 6 months. He was seen by Dr. Servando Snare in cardiac surgical consultation who felt that his chances of recovering from major cardiac surgery would be very poor. He presents today for follow-up evaluation.  The patient feels like he is doing okay. He is here with his daughter today. He has recovered from inguinal hernia surgery. His appetite is slowly improving. He does have shortness of breath with activity but is comfortable at rest. He denies chest pain or pressure, edema, orthopnea, or PND.   Past Medical History  Diagnosis Date  . Hypertension   . CKD (chronic kidney disease) stage 4, GFR 15-29 ml/min (HCC)   . Esophageal cancer ([place]) 1990  . Hypothyroidism   . Inguinal hernia February 2013    right  . Esophageal stricture   . Food impaction of esophagus   . Barrett esophagus   . Gastroparesis   . Aortic aneurysm, abdominal (HCC)     repair  . Arthritis   . Aortic stenosis 09/17/2015    a. Echo 08/2015: Moderate AS  . Dilated cardiomyopathy ([place]) 09/17/2015    a. Echo 08/2015: EF 20-25% w. global HK and inferolateral AK    Past Surgical History  Procedure Laterality Date  . Sp endo  repair infraren aaa  8/09    es  . Coarctation of aorta repair  2009  . Inguinal hernia repair  09/01/11    bilateral  . Esophagogastroduodenoscopy  10/05/2011    Procedure: ESOPHAGOGASTRODUODENOSCOPY (EGD);  Surgeon: Ladene Artist, MD,FACG;  Location: [facility] ENDOSCOPY;  Service: Endoscopy;  Laterality: N/A;  . Vagotomy    . Esophagectomy      partial 1997  . Umbilical hernia repair    . Total hip arthroplasty Right 08/06/2012    Procedure: RIGHT TOTAL HIP ARTHOPLASTY WITH ACETABULAR AUTOGRAFT;  Surgeon: Gearlean Alf, MD;  Location: [facility] ORS;  Service: Orthopedics;  Laterality: Right;  RIGHT TOTAL HIP ARTHOPLASTY WITH ACETABULAR AUTOGRAFT   . Inguinal hernia repair Left 10/09/2015    Procedure: open femoral hernia repair;  Surgeon: Mickeal Skinner, MD;  Location: [place];  Service: General;  Laterality: Left;  . Cardiac catheterization N/A 10/17/2015    Procedure: Right/Left Heart Cath and Coronary Angiography;  Surgeon: Sherren Mocha, MD;  Location: [place] [facility];  Service: Cardiovascular;  Laterality: N/A;    Current Outpatient Prescriptions  Medication Sig Dispense Refill  . amLODipine (NORVASC) 5 MG tablet Take 1 tablet (5 mg total) by mouth daily. 30 tablet 5  . aspirin 81 MG tablet Take 81 mg by mouth daily.    [name] Calcium (STOOL SOFTENER PO) Take 2 tablets by mouth daily.    . hydrochlorothiazide (MICROZIDE) 12.5 MG capsule Take 1 capsule (12.5 mg total) by  mouth daily. 90 capsule 3  . levothyroxine (SYNTHROID, LEVOTHROID) 88 MCG tablet Take 1 tablet (88 mcg total) by mouth daily. 90 tablet 2  . mirtazapine (REMERON) 15 MG tablet Take 15 mg by mouth at bedtime.    . Omega-3 Fatty Acids (FISH OIL PO) Take 1 capsule by mouth daily.    Marland Kitchen omeprazole (PRILOSEC) 20 MG capsule TAKE 1 CAPSULE DAILY 90 capsule 2  . omeprazole (PRILOSEC) 20 MG capsule Take 20 mg by mouth daily.    . tamsulosin (FLOMAX) 0.4 MG CAPS capsule TAKE 1 CAPSULE (0.4 MG TOTAL) BY MOUTH DAILY AFTER SUPPER.  90 capsule 3  . traMADol (ULTRAM) 50 MG tablet Take 1-2 tablets (50-100 mg total) by mouth every 6 (six) hours as needed for severe pain. 15 tablet 0   No current facility-administered medications for this visit.    Allergies:   Morphine and related   Social History:  The patient  reports that he quit smoking about 44 years ago. His smoking use included Cigarettes. He has a 11 pack-year smoking history. He has never used smokeless tobacco. He reports that he does not drink alcohol or use illicit drugs.   Family History:  The patient's family history includes Aneurysm in his daughter; Arthritis/Rheumatoid in his mother; Glaucoma in his sister; Pancreatic cancer in his father.    ROS:  Please see the history of present illness.  All other systems are reviewed and negative.    PHYSICAL EXAM: VS:  BP 120/40 mmHg  Pulse 64  Ht 5\' 7"  (1.702 m)  Wt 116 lb 9.6 oz (52.889 kg)  BMI 18.26 kg/m2 , BMI Body mass index is 18.26 kg/(m^2). GEN: Frail thin elderly male in no acute distress HEENT: normal Neck: no JVD, no masses.  Cardiac: RRR with 2/6 diastolic decrescendo murmur and 2/6 systolic murmur at the RUSB              Respiratory:  clear to auscultation bilaterally, normal work of breathing GI: soft, nontender, nondistended, + BS, thin MS: no deformity or atrophy Ext: no pretibial edema Skin: warm and dry, no rash Neuro:  Strength and sensation are intact Psych: euthymic mood, full affect  EKG:  EKG is not ordered today.  Recent Labs: 10/09/2015: Magnesium 2.1; TSH 2.754 10/10/2015: ALT 10* 10/16/2015: Hemoglobin 10.5*; Platelets 172 10/17/2015: BUN 28*; Creatinine, Ser 1.58*; Potassium 4.2; Sodium 139   Lipid Panel     Component Value Date/Time   CHOL 196 01/31/2013 1639   TRIG 94.0 01/31/2013 1639   HDL 46.20 01/31/2013 1639   CHOLHDL 4 01/31/2013 1639   VLDL 18.8 01/31/2013 1639   LDLCALC 131* 01/31/2013 1639   LDLDIRECT 143.0 12/04/2010 1157      Wt Readings from Last 3  Encounters:  11/01/15 116 lb 9.6 oz (52.889 kg)  10/25/15 112 lb (50.803 kg)  10/22/15 116 lb 6.4 oz (52.799 kg)     Cardiac Studies Reviewed: Cardiac Cath 10-17-2015: Conclusion     Prox LAD lesion, 80% stenosed.  Mid LAD lesion, 90% stenosed.  Mid Cx lesion, 50% stenosed.  Dist RCA lesion, 100% stenosed.  1. Severe 2 vessel CAD with total occlusion of the RCA and severe stenosis of the LAD with heavy diffuse coronary calcification 2. Dilated aortic root with severe aortic insufficiency 3. Minimal transaortic valve gradient on LV pullback, suggestive of no significant aortic stenosis  Suspect heart failure and cardiomyopathy due to a combination of coronary disease and aortic insufficiency as primary valve lesion. The  aortic sinuses appear severely dilated on aortography and there is severe global LV systolic dysfunction with LVEF estimated < 30%.  Will refer to cardiac surgery for evaluation, but I suspect this patient will not be a candidate for surgical intervention.   ASSESSMENT AND PLAN: 1.  Aortic valve insufficiency with chronic systolic heart failure: The patient has a dilated left ventricle, dilated aorta, and severe aortic insufficiency. Unfortunately he is not a surgical candidate and I agree that he would not be able to recover from major cardiac surgery. We are limited to palliative medical therapy. Recommended that he stop amlodipine and start Procardia XL 30 mg daily. The patient will take Lasix 20 mg daily as needed for edema or weight gain. He otherwise will continue on his current medical program. We discussed his prognosis and potential worsening of symptoms that will occur over time.  2. Coronary artery disease, native vessel, without angina: The patient has chronic total occlusion of the right coronary artery with evidence of old inferior MI as well as severe disease of the LAD. He will be treated medically.  3. Chronic systolic heart failure, NYHA functional  class III: In the setting of severe aortic insufficiency, severe CAD, and dilated cardiomyopathy, not much to offer from medical therapy perspective. In addition the patient is severely deconditioned and he has protein calorie malnutrition. He has good family support and they understand his prognosis. Medicine changes detailed above.  Current medicines are reviewed with the patient today.  The patient does not have concerns regarding medicines.  Labs/ tests ordered today include:  No orders of the defined types were placed in this encounter.    Disposition:   FU APP in 3 months, me in 6 months  Signed, Sherren Mocha, MD  11/01/2015 11:51 AM    [address], [place], [place]  [address] Phone: [phone]; Fax: [phone]

## 2015-11-01 NOTE — Patient Instructions (Signed)
Medication Instructions:  Your physician has recommended you make the following change in your medication:  1. STOP Amlodipine 2. START Procardia XL 30mg  take one tablet by mouth daily 3. Prescription given for Furosemide 20mg  take one tablet by mouth daily as needed for greater than 3 lb weight gain in 24 hours or 5 lb weight gain in 1 week.  If you use this medication 2 days in a row and remain symptomatic or weight does not decrease please contact the office.   Labwork: No new orders.   Testing/Procedures: No new orders.   Follow-Up: Your physician recommends that you schedule a follow-up appointment in: 3 MONTHS with PA/NP  Your physician wants you to follow-up in: 6 MONTHS with Dr Burt Knack.  You will receive a reminder letter in the mail two months in advance. If you don't receive a letter, please call our office to schedule the follow-up appointment.   Any Other Special Instructions Will Be Listed Below (If Applicable).     If you need a refill on your cardiac medications before your next appointment, please call your pharmacy.

## 2015-11-02 ENCOUNTER — Other Ambulatory Visit: Payer: Self-pay

## 2015-11-02 NOTE — Patient Outreach (Signed)
[facility]) Care Management  11/02/2015  Isaiah Graham Nov 17, 1929 [id number]  Transition of care call. Member reports he has seen cardiologist. Member reports he is weighing self everyday and usually weighs between 112-115 pounds. Member denies any questions or concerns.  Plan: continue weekly transition of care engagements.  Thea Silversmith, RN, MSN, [place] Coordinator Cell: [phone]

## 2015-11-07 ENCOUNTER — Other Ambulatory Visit: Payer: Self-pay

## 2015-11-07 NOTE — Patient Outreach (Signed)
[facility]) Care Management  11/07/2015  ASER BURZYNSKI 02-Jun-1930 [id number]  82 year old with recent admission for Left femoral hernia with bowel incarceration. RNCM called for transition of care call. Member was not in. HIPPA compliant message left requesting a return call.  Plan: await return call. Continue to follow for weekly transition of care engagements and to complete assessment. Plan to complete home visit.  Thea Silversmith, RN, MSN, [place] Coordinator Cell: [phone]

## 2015-11-12 ENCOUNTER — Other Ambulatory Visit: Payer: Self-pay

## 2015-11-12 NOTE — Patient Outreach (Signed)
[facility]) Care Management  [place]  11/12/2015   Isaiah Graham May 02, 1930 [id number]  Subjective: Member reports feeling better than when he was in the hospital. Member states he is weighing himself daily.  Objective: BP 124/78 mmHg  Pulse 54  Resp 16  Ht 1.702 m (_0 )  Wt 112 lb 12.8 oz (51.166 kg)  BMI 17.66 kg/m2  SpO2 98%, lugs clear, heart rate regular.   Encounter Medications:  Outpatient Encounter Prescriptions as of 11/12/2015  Medication Sig  . aspirin 81 MG tablet Take 81 mg by mouth daily.  [name] Calcium (STOOL SOFTENER PO) Take 2 tablets by mouth daily.  . furosemide (LASIX) 20 MG tablet Take 1 tablet (20 mg total) by mouth daily as needed (for weight gain greater that 3 lb in 24 hours or 5 lb in 1 week).  . hydrochlorothiazide (MICROZIDE) 12.5 MG capsule Take 1 capsule (12.5 mg total) by mouth daily.  Marland Kitchen levothyroxine (SYNTHROID, LEVOTHROID) 88 MCG tablet Take 1 tablet (88 mcg total) by mouth daily.  . mirtazapine (REMERON) 15 MG tablet Take 15 mg by mouth at bedtime.  Marland Kitchen NIFEdipine (PROCARDIA-XL/ADALAT-CC/NIFEDICAL-XL) 30 MG 24 hr tablet Take 1 tablet (30 mg total) by mouth daily.  . Omega-3 Fatty Acids (FISH OIL PO) Take 1 capsule by mouth daily.  Marland Kitchen omeprazole (PRILOSEC) 20 MG capsule TAKE 1 CAPSULE DAILY  . tamsulosin (FLOMAX) 0.4 MG CAPS capsule TAKE 1 CAPSULE (0.4 MG TOTAL) BY MOUTH DAILY AFTER SUPPER.  . traMADol (ULTRAM) 50 MG tablet Take 1-2 tablets (50-100 mg total) by mouth every 6 (six) hours as needed for severe pain. (Patient not taking: Reported on 11/12/2015)   No facility-administered encounter medications on file as of 11/12/2015.    Functional Status:  In your present state of health, do you have any difficulty performing the following activities: 11/12/2015 10/16/2015  Hearing? N Y  Vision? N N  Difficulty concentrating or making decisions? Y N  Walking or climbing stairs? N N  Dressing or bathing? N N   Doing errands, shopping? N Y  Conservation officer, nature and eating ? N -  Using the Toilet? N -  In the past six months, have you accidently leaked urine? N -  Do you have problems with loss of bowel control? N -  Managing your Medications? N -  Managing your Finances? N -  Housekeeping or managing your Housekeeping? N -    Fall/Depression Screening: PHQ 2/9 Scores 11/02/2015 11/01/2014 07/06/2014 07/13/2013 01/31/2013  PHQ - 2 Score 0 0 0 0 0   Fall Risk  11/02/2015 11/01/2014 07/06/2014 07/13/2013 01/31/2013  Falls in the past year? _1    Assessment: 82 year old with recent admission due to inguinal hernia incarceration, congestive heart failure. Followed by outpatient cardiac catheterization procedure. Present during visit also was member's wife. Member reports he is doing better. Member reports he and his wife received meals on wheels and he get meals from other places. Member denies an issue with obtaining meals.  Heart failure-member reports his diagnosis of heart failure is new and is interested in more education/reinforcement regaring heart failure.  Wound to buttocks-member reports some type of dressing covering that is changed every few days. Home health nurse involved and managing wound. Member states nurse said the area is healing, RNCM reinforced the need to change position frequently when sitting and good nutrition to promote wound healing.   Plan: care coordination with home health nurse as needed,  continue to follow-home visit next month.  THN CM Care Plan Problem One        Most Recent Value   Care Plan Problem One  at risk for readmission   Role Documenting the Problem One  Care Management [place] for Problem One  Active   THN Long Term Goal (31-90 days)  Member will not be readmitted within the next 31 days.   THN Long Term Goal Start Date  10/15/15   THN Long Term Goal Met Date  11/12/15   THN CM Short Term Goal #1 (0-30 days)  member will attend  scheduled appointments within the next 30 days.   THN CM Short Term Goal #1 Start Date  10/15/15   THN CM Short Term Goal #1 Met Date  11/12/15   THN CM Short Term Goal #2 (0-30 days)  member will call RNCM, provider and/or nurse advice line as needed within the next 30 days.   THN CM Short Term Goal #2 Start Date  10/15/15   [facility] CM Short Term Goal #2 Met Date  11/12/15    [facility] CM Care Plan Problem Two        Most Recent Value   Care Plan Problem Two  knowledge deficit regarding heart failure   Role Documenting the Problem Two  Care Management Coordinator   Care Plan for Problem Two  Active   Interventions for Problem Two Long Term Goal   provided [facility] calendar organizer, reviewed heart failure zone tool, reinfordced the importance of knowing and having an action plan, encouraged member to reviewe zone tool, reinforced prn furosemide instuctions.   THN Long Term Goal (31-90) days  member will verbalize at least three heart failure managment strategies within the next 45-60 days..   THN Long Term Goal Start Date  11/12/15   THN CM Short Term Goal #1 (0-30 days)  member will verbalize heart failure zones and actions associated with the zones within the next 30 days.   THN CM Short Term Goal #1 Start Date  11/12/15   Interventions for Short Term Goal #2   reviewed heart failure zone tool   THN CM Short Term Goal #2 (0-30 days)  member will continue to weigh and record weights within the next 30 days.   THN CM Short Term Goal #2 Start Date  11/12/15     Thea Silversmith, RN, MSN, [place] Coordinator Cell: [phone]

## 2015-11-14 ENCOUNTER — Other Ambulatory Visit: Payer: Self-pay

## 2015-11-14 NOTE — Patient Outreach (Signed)
[facility]) Care Management  11/14/2015  KHAMBREL BALENT 10/20/1929 [id number]  Care Coordination-RNCM received return call from advanced home care that Marissa is Levi Aland [phone]. RNCM called home health nurse for care coordination. No answer. HIPPA compliant message left.  Plan: continue to attempt to reach.  Thea Silversmith, RN, MSN, [place] Coordinator Cell: [phone]

## 2015-11-14 NOTE — Patient Outreach (Signed)
[facility]) Care Management  11/14/2015  ABASS MAGAN 12/20/1929 [id number]  Care Coordination-call home health agency for contact information of home health nurse.  Plan: Await return call and continue to follow.  Thea Silversmith, RN, MSN, [place] Coordinator Cell: [phone]

## 2015-11-16 ENCOUNTER — Telehealth: Payer: Self-pay | Admitting: Family Medicine

## 2015-11-16 ENCOUNTER — Other Ambulatory Visit: Payer: Self-pay

## 2015-11-16 MED ORDER — "TEGADERM PLUS 4""X5-7/8"" EX MISC": 1.0000 | CUTANEOUS | Status: DC | PRN

## 2015-11-16 NOTE — Telephone Encounter (Signed)
OK to refill

## 2015-11-16 NOTE — Telephone Encounter (Signed)
Wife called to request a Rx for the  Tegaderm Plus Pads for the sores on his bottom. Wife states Dr Elease Hashimoto gave him some in the past.  [facility]/ [place]

## 2015-11-16 NOTE — Telephone Encounter (Signed)
Rx Sent  

## 2015-11-16 NOTE — Patient Outreach (Signed)
[facility]) Care Management  11/16/2015  Isaiah Graham Jun 08, 1930 [id number]  Cardiology notified of member's heart rate 45, blood pressure 124/78 during home visit-Member asymptomatic.  Plan: continue to follow. Care coordination with home health.  Thea Silversmith, RN, MSN, [place] Coordinator Cell: [phone]

## 2015-11-20 ENCOUNTER — Telehealth: Payer: Self-pay | Admitting: Family Medicine

## 2015-11-20 MED ORDER — "TEGADERM + PAD 2-3/8""X4"" MISC": Status: DC

## 2015-11-20 NOTE — Telephone Encounter (Signed)
[name] - Client [name] Patient Name: Isaiah Graham Gender: Male DOB: [birth year] Age: 81 Y 17 D Return Phone Number: [phone] (Primary) Address: City/State/Zip: [place] [address] Client [name] - Client Client Site [name] Physician [name] - MD Contact Type Call Who Is Calling Patient / Member / Family / Caregiver Call Type Triage / Clinical Caller Name [name] Relationship To Patient Spouse Return Phone Number [phone] (Primary) Chief Complaint Sores Reason for Call Symptomatic / Request for Health Information Initial Comment She is needing to get some patches for her husband due to bed sores. Translation No Nurse Assessment Nurse: Mills-Hernandez, RN, Izora Gala Date/Time (Eastern Time): 11/17/2015 11:06:06 AM Confirm and document reason for call. If symptomatic, describe symptoms. You must click the next button to save text entered. ---Caller states that she needs Tegaderm patches for her husband's bed sores. Husband does have a home health nurse that comes to home weekly. Has the patient traveled out of the country within the last 30 days? ---Not Applicable Does the patient have any new or worsening symptoms? ---Yes Will a triage be completed? ---Yes Related visit to physician within the last 2 weeks? ---No Does the PT have any chronic conditions? (i.e. diabetes, asthma, etc.) ---No Is this a behavioral health or substance abuse call? ---No Guidelines Guideline Title Affirmed Question Affirmed Notes Nurse Date/Time (Roundup Time) Disp. Time [name] Time) Disposition Final User 11/17/2015 11:14:46 AM Clinical Call Yes Mills-Hernandez, RN, Izora Gala Comments User: [name], RN Date/Time [name] Time): 11/17/2015 11:14:39 AM PLEASE NOTE: All timestamps contained within this report are  represented as [place] Standard Time. CONFIDENTIALTY NOTICE: This fax transmission is intended only for the addressee. It contains information that is legally privileged, confidential or otherwise protected from use or disclosure. If you are not the intended recipient, you are strictly prohibited from reviewing, disclosing, copying using or disseminating any of this information or taking any action in reliance on or regarding this information. If you have received this fax in error, please notify us immediately by telephone so that we can arrange for its return to [place]. Phone: [phone], Toll-Free: [phone], Fax: [phone] Page: 2 of 2 Call Id: [id number] Comments Caller states that nurse was going to call her back on Friday so she could get Tegaderm patches for her husband's bed sores and the nurse did not call her back. Nurse advised that she would need to call the office again on Tuesday.

## 2015-11-20 NOTE — Telephone Encounter (Signed)
Request for medication

## 2015-11-20 NOTE — Telephone Encounter (Signed)
Patches has been sent into pharmacy.

## 2015-11-29 ENCOUNTER — Ambulatory Visit: Payer: Medicare Other | Admitting: Family Medicine

## 2015-11-29 ENCOUNTER — Other Ambulatory Visit: Payer: Self-pay

## 2015-11-29 NOTE — Patient Outreach (Signed)
[facility]) Care Management  11/29/2015  CHOU BELDIN 12-07-1929 [id number]  RNCM called for transition of care. Member was not available. HIIPPA compliant message left.   Plan: continue to follow.  Thea Silversmith, RN, MSN, [place] Coordinator Cell: [phone]

## 2015-12-05 ENCOUNTER — Other Ambulatory Visit: Payer: Self-pay

## 2015-12-05 VITALS — BP 118/58 | HR 57 | Resp 18 | Ht 67.0 in | Wt 112.8 lb

## 2015-12-05 DIAGNOSIS — I509 Heart failure, unspecified: Secondary | ICD-10-CM

## 2015-12-05 NOTE — Patient Outreach (Signed)
[facility]) Care Management  [place]  12/05/2015   LAURITZ TUNGATE [birth year] [id number]  Subjective: "I feel good"  Objective: BP 118/58 mmHg  Pulse 57  Resp 18  Ht 1.702 m (5\' 7" )  Wt 112 lb 12.8 oz (51.166 kg)  BMI 17.66 kg/m2  SpO2 97%, lungs decreased, respirations even unlabored, heart rate regular. No edema noted.   Encounter Medications:  Outpatient Encounter Prescriptions as of 12/05/2015  Medication Sig  . aspirin 81 MG tablet Take 81 mg by mouth daily.  [name] Calcium (STOOL SOFTENER PO) Take 2 tablets by mouth daily.  . furosemide (LASIX) 20 MG tablet Take 1 tablet (20 mg total) by mouth daily as needed (for weight gain greater that 3 lb in 24 hours or 5 lb in 1 week).  . hydrochlorothiazide (MICROZIDE) 12.5 MG capsule Take 1 capsule (12.5 mg total) by mouth daily.  Marland Kitchen levothyroxine (SYNTHROID, LEVOTHROID) 88 MCG tablet Take 1 tablet (88 mcg total) by mouth daily.  . mirtazapine (REMERON) 15 MG tablet Take 15 mg by mouth at bedtime.  Marland Kitchen NIFEdipine (PROCARDIA-XL/ADALAT-CC/NIFEDICAL-XL) 30 MG 24 hr tablet Take 1 tablet (30 mg total) by mouth daily.  . Omega-3 Fatty Acids (FISH OIL PO) Take 1 capsule by mouth daily.  Marland Kitchen omeprazole (PRILOSEC) 20 MG capsule TAKE 1 CAPSULE DAILY  . tamsulosin (FLOMAX) 0.4 MG CAPS capsule TAKE 1 CAPSULE (0.4 MG TOTAL) BY MOUTH DAILY AFTER SUPPER.  . Transparent Dressings (TEGADERM + PAD 2-3/8"X4") MISC Apply to affected area once a day or as needed.  . traMADol (ULTRAM) 50 MG tablet Take 1-2 tablets (50-100 mg total) by mouth every 6 (six) hours as needed for severe pain. (Patient not taking: Reported on 11/12/2015)  . Wound Dressings (TEGADERM PLUS 4"X5-7/8") MISC Apply 1 each topically as needed. (Patient not taking: Reported on 12/05/2015)   No facility-administered encounter medications on file as of 12/05/2015.    Functional Status:  In your present state of health, do you have any difficulty performing  the following activities: 11/12/2015 10/16/2015  Hearing? N Y  Vision? N N  Difficulty concentrating or making decisions? Y N  Walking or climbing stairs? N N  Dressing or bathing? N N  Doing errands, shopping? N Y  Conservation officer, nature and eating ? N -  Using the Toilet? N -  In the past six months, have you accidently leaked urine? N -  Do you have problems with loss of bowel control? N -  Managing your Medications? N -  Managing your Finances? N -  Housekeeping or managing your Housekeeping? N -    Fall/Depression Screening: PHQ 2/9 Scores 11/02/2015 11/01/2014 07/06/2014 07/13/2013 01/31/2013  PHQ - 2 Score 0 0 0 0 0    Assessment: 82 year old with recent admission due to heart failure. Member is currently on furosemide as needed. RNCM reinforced parameter for using furosemide per chart. Member denies any issues or concerns. RNCM discussed meals. Member reports he canceled meals on wheels stating he did not like the meals, but states he is eating good. Member reports he goes out to get his meals eating fruit, vegetables, meat.   RNCM reviewed EMMI handouts: foods high in sodium and Fall prevention. RNCM also provided heart failure packet and reinforced the heart failure zone tool.  Home health currently following for sacral wound. Area covered by tegaderm. Member reports his wife changed the tegaderm patch on yesterday. Home health nurse to come next week. Mrs. Makarewicz reports one  area is healed and the other is looking better. RNCM discussed siigns to report to doctor.  Plan: Follow up with home health nurse to see if any additonal care management needs. If none then transition to health coach.  Thea Silversmith, RN, MSN, [place] Coordinator Cell: [phone]

## 2015-12-07 ENCOUNTER — Other Ambulatory Visit: Payer: Self-pay

## 2015-12-07 NOTE — Patient Outreach (Signed)
[facility]) Care Management  12/07/2015  [name] January 26, 1930 [id number]  Care Coordination: RNCM spoke with home health nurse Gentry Roch additional care management needs identified. Ms. Isaiah Graham reports they are still seeing member for wound care.  Case transitioned to [facility].  Thea Silversmith, RN, MSN, [place] Coordinator Cell: [phone]

## 2015-12-17 ENCOUNTER — Other Ambulatory Visit: Payer: Self-pay

## 2015-12-17 NOTE — Patient Outreach (Signed)
[facility]) Care Management  12/17/2015  Isaiah Graham [birth year] [id number]  Telephone call to patient for introductory call. Explained to patient health coach role.  Patient is receptive to call.   Plan: RN Health Coach will contact patient within one month and patient agrees to next outreach.    Jone Baseman, RN, MSN [place] [phone]

## 2016-01-03 ENCOUNTER — Other Ambulatory Visit: Payer: Self-pay

## 2016-01-03 NOTE — Patient Outreach (Signed)
Isaiah Graham) Care Management  Isaiah Graham  01/03/2016   Isaiah Graham June 19, 1930 [id number]  Subjective: Telephone call to patient for initial assessment.  Patient reports he is doing good.  He reports he has a dental appointment to replace some teeth.   He reports that his sacral Isaiah continues. He states it is no worse and Isaiah being applied. Home health has discontinued.  Patient reports that his weight is staying about 111-112 lbs. Patient reports he is watching his salt intake.  Discussed with patient ways to monitor heart failure and when to notify physician.  He verbalized understanding.   Objective:   Encounter Medications:  Outpatient Encounter Prescriptions as of 01/03/2016  Medication Sig  . aspirin 81 MG tablet Take 81 mg by mouth daily.  Isaiah Graham Isaiah Graham (Isaiah Graham) Take 2 tablets by mouth daily.  . furosemide (Isaiah Graham) 20 MG tablet Take 1 tablet (20 mg total) by mouth daily as needed (for weight gain greater that 3 lb in 24 hours or 5 lb in 1 week).  . Isaiah Graham (Isaiah Graham) 12.5 MG capsule Take 1 capsule (12.5 mg total) by mouth daily.  Marland Kitchen Isaiah Graham (Isaiah Graham, Isaiah Graham) 88 MCG tablet Take 1 tablet (88 mcg total) by mouth daily.  . Isaiah Graham (Isaiah Graham) 15 MG tablet Take 15 mg by mouth at bedtime.  Marland Kitchen Isaiah Graham (Isaiah Graham/Isaiah Graham/Isaiah Graham) 30 MG 24 hr tablet Take 1 tablet (30 mg total) by mouth daily.  . Isaiah Graham (Isaiah Graham) Take 1 capsule by mouth daily.  Marland Kitchen Isaiah Graham (Isaiah Graham) 20 MG capsule TAKE 1 CAPSULE DAILY  . Isaiah Graham (Isaiah Graham) 0.4 MG CAPS capsule TAKE 1 CAPSULE (0.4 MG TOTAL) BY MOUTH DAILY AFTER SUPPER.  . Isaiah Graham (Isaiah Graham) 50 MG tablet Take 1-2 tablets (50-100 mg total) by mouth every 6 (six) hours as needed for severe pain.  . Isaiah Graham (Isaiah + PAD 2-3/8"X4") MISC Apply to affected area once a day or as needed.  . Isaiah Graham (Isaiah PLUS 4"X5-7/8") MISC Apply 1  each topically as needed. (Patient not taking: Reported on 12/05/2015)   No facility-administered encounter medications on file as of 01/03/2016.    Functional Status:  In your present state of health, do you have any difficulty performing the following activities: 01/03/2016 11/12/2015  Hearing? N N  Vision? N N  Difficulty concentrating or making decisions? Isaiah Graham  Walking or climbing stairs? N N  Dressing or bathing? N N  Doing errands, shopping? N N  Preparing Food and eating ? N N  Using the Toilet? N N  In the past six months, have you accidently leaked urine? N N  Do you have problems with loss of bowel control? N N  Managing your Medications? N N  Managing your Finances? N N  Housekeeping or managing your Housekeeping? N N    Fall/Depression Screening: PHQ 2/9 Scores 01/03/2016 11/02/2015 11/01/2014 07/06/2014 07/13/2013 01/31/2013  PHQ - 2 Score 0 0 0 0 0 0    Assessment: Patient continues to benefit from health coach outreach for disease management and support.    Plan:  Isaiah Graham CM Care Plan Problem Two        Most Recent Value   Care Plan Problem Two  knowledge deficit regarding heart failure   Role Documenting the Problem Isaiah Graham for Problem Two  Active   Interventions for Problem Two Long Term Goal   Isaiah Graham Health coach discussed with patient heart failure management such as  daily weights, decreasing sodium intake, and monitoring for swelling to  abdomen, legs,  and feet.     Isaiah Graham Long Term Goal (31-90) days  member will verbalize at least three heart failure managment strategies within the next 45-60 days..   Isaiah Graham Long Term Goal Start Date  01/03/16   Isaiah Graham CM Short Term Goal #1 (0-30 days)  member will verbalize heart failure zones and actions associated with the zones within the next 30 days.   Isaiah Graham CM Short Term Goal #1 Start Date  01/03/16   Interventions for Short Term Goal #2   Isaiah Graham Health Coach discussed heart failure zone chart.    Isaiah Graham CM Short Term Goal #2  (0-30 days)  member will continue to weigh and record weights within the next 30 days.   Isaiah Graham CM Short Term Goal #2 Start Date  11/12/15   Isaiah Graham CM Short Term Goal #2 Met Date  01/03/16   Interventions for Short Term Goal #2  Patient continues to weigh daily.       Isaiah Graham Health Coach will provide ongoing education for patient on heart failure through phone calls and sending printed information to patient for further discussion.  Isaiah Graham Health Coach sent welcome letter.   Isaiah Graham Health Coach will send initial barriers letter, assessment, and care plan to primary care physician.  Isaiah Graham Health Coach will contact patient in the month of August and patient agrees to next contact.   Isaiah Baseman, Isaiah Graham, Isaiah Graham [phone]

## 2016-01-31 ENCOUNTER — Other Ambulatory Visit: Payer: Self-pay

## 2016-01-31 NOTE — Patient Outreach (Signed)
[facility]) Care Management  [place]  01/31/2016   CHANG DONALD 1929/10/03 [id number]  Subjective: Telephone call to patient for monthly call. Patient reports he is doing pretty good.  He reports his buttock wound continues to heal.  He reports his appetite has been good but he has not gained any weight.  Advised patient to continue to eat a balanced diet with extra calories.  He verbalized understanding. Patient denies swelling or increased shortness of breath.  Discussed with patient signs and symptoms of heart failure and when to notify physician. He verbalized understanding.   Objective:   Encounter Medications:  Outpatient Encounter Prescriptions as of 01/31/2016  Medication Sig  . aspirin 81 MG tablet Take 81 mg by mouth daily.  [name] Calcium (STOOL SOFTENER PO) Take 2 tablets by mouth daily.  . furosemide (LASIX) 20 MG tablet Take 1 tablet (20 mg total) by mouth daily as needed (for weight gain greater that 3 lb in 24 hours or 5 lb in 1 week).  . hydrochlorothiazide (MICROZIDE) 12.5 MG capsule Take 1 capsule (12.5 mg total) by mouth daily.  Marland Kitchen levothyroxine (SYNTHROID, LEVOTHROID) 88 MCG tablet Take 1 tablet (88 mcg total) by mouth daily.  . mirtazapine (REMERON) 15 MG tablet Take 15 mg by mouth at bedtime.  Marland Kitchen NIFEdipine (PROCARDIA-XL/ADALAT-CC/NIFEDICAL-XL) 30 MG 24 hr tablet Take 1 tablet (30 mg total) by mouth daily.  . Omega-3 Fatty Acids (FISH OIL PO) Take 1 capsule by mouth daily.  Marland Kitchen omeprazole (PRILOSEC) 20 MG capsule TAKE 1 CAPSULE DAILY  . tamsulosin (FLOMAX) 0.4 MG CAPS capsule TAKE 1 CAPSULE (0.4 MG TOTAL) BY MOUTH DAILY AFTER SUPPER.  . Transparent Dressings (TEGADERM + PAD 2-3/8"X4") MISC Apply to affected area once a day or as needed.  . traMADol (ULTRAM) 50 MG tablet Take 1-2 tablets (50-100 mg total) by mouth every 6 (six) hours as needed for severe pain.  . Wound Dressings (TEGADERM PLUS 4"X5-7/8") MISC Apply 1 each topically  as needed. (Patient not taking: Reported on 12/05/2015)   No facility-administered encounter medications on file as of 01/31/2016.     Functional Status:  In your present state of health, do you have any difficulty performing the following activities: 01/03/2016 11/12/2015  Hearing? N N  Vision? N N  Difficulty concentrating or making decisions? [name]  Walking or climbing stairs? N N  Dressing or bathing? N N  Doing errands, shopping? N N  Preparing Food and eating ? N N  Using the Toilet? N N  In the past six months, have you accidently leaked urine? N N  Do you have problems with loss of bowel control? N N  Managing your Medications? N N  Managing your Finances? N N  Housekeeping or managing your Housekeeping? N N  Some recent data might be hidden    Fall/Depression Screening: PHQ 2/9 Scores 01/03/2016 11/02/2015 11/01/2014 07/06/2014 07/13/2013 01/31/2013  PHQ - 2 Score 0 0 0 0 0 0    Assessment: Patient continues to benefit from health coach outreach for disease management and support.    Plan:  [facility] CM Care Plan Problem Two   Flowsheet Row Most Recent Value  Care Plan Problem Two  knowledge deficit regarding heart failure  Role Documenting the Problem Two  [place] for Problem Two  Active  Interventions for Problem Two Long Term Goal   RN Health Coach reviewed with patient heart failure management such as daily weights, decreasing sodium  intake, and monitoring for swelling to  abdomen, legs,  and feet.    THN Long Term Goal (31-90) days  member will verbalize at least three heart failure managment strategies within the next 45-60 days..  THN Long Term Goal Start Date  01/03/16 [goal continued]  THN CM Short Term Goal #1 (0-30 days)  member will verbalize heart failure zones and actions associated with the zones within the next 30 days.  THN CM Short Term Goal #1 Start Date  01/31/16 [goal continued]  Interventions for Short Term Goal #2   RN Health Coach reviewed  heart failure zone chart.      RN Health Coach will contact patient in the month of September and patient agrees to next outreach.  Jone Baseman, RN, MSN [place] [phone]

## 2016-02-10 DIAGNOSIS — I2510 Atherosclerotic heart disease of native coronary artery without angina pectoris: Secondary | ICD-10-CM | POA: Insufficient documentation

## 2016-02-10 NOTE — Progress Notes (Signed)
Cardiology Office Note:    Date:  02/11/2016   ID:  Isaiah Graham, DOB Oct 25, 1929, MRN [id number]  PCP:  Eulas Post, MD  Cardiologist:  Dr. Sherren Mocha   Electrophysiologist:  n/a  Referring MD: Eulas Post, MD   Chief Complaint  Patient presents with  . 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  He underwent right and left heart catheterization in 4/17. This demonstrated severe 2 vessel CAD with total occlusion of the RCA and severe stenosis of LAD with heavy diffuse cornea calcification. He has general failure to thrive with protein calorie malnutrition and very poor functional status over the past 6 months. He was evaluated by T CTS (Dr. Servando Snare total). His chances of recovering from major cardiac surgery was felt to be poor. Therefore, he is limited related medical therapy. Last seen by Dr. Burt Knack 11/01/15. Amlodipine was stopped. He was placed on Procardia. He returns for follow-up.  Here with his daughter today. Since last seen, he has done fairly well. He is fairly sedentary. However, he denies significant dyspnea. He denies chest discomfort. He denies syncope. He denies orthopnea, PND or edema.   Prior CV studies that were reviewed today include:    LHC 10/17/15 LAD proximal 80%, mid 90% LCx mid 50% RCA severely calcified, distal occluded 4+ AI 1. Severe 2 vessel CAD with total occlusion of the RCA and severe stenosis of the LAD with heavy diffuse coronary calcification 2. Dilated aortic root with severe aortic insufficiency 3. Minimal transaortic valve gradient on LV pullback, suggestive of no significant aortic stenosis Suspect heart failure and cardiomyopathy due to a combination of coronary disease and aortic insufficiency as primary valve lesion. The aortic sinuses  appear severely dilated on aortography and there is severe global LV systolic dysfunction with LVEF estimated < 30%. Will refer to cardiac surgery for evaluation, but I suspect this patient will not be a candidate for surgical intervention.     ETT-Echo 09/20/15 Moderate aortic stenosis-gradients exaggerated due to aortic insufficiency, severe ischemic cardiomyopathy  Echo 09/13/15 EF 20-25%, diffuse HK, inferolateral AK, grade 1 diastolic dysfunction, moderate aortic stenosis (mean 25 mmHg), moderate aortic insufficiency, MAC, mild MR, severe LAE, mild RAE, PASP 54 mmHg, trivial effusion   Past Medical History:  Diagnosis Date  . Aortic aneurysm, abdominal (HCC)    repair  . Aortic stenosis 09/17/2015   a. Echo 08/2015: Moderate AS  . Arthritis   . Barrett esophagus   . CKD (chronic kidney disease) stage 4, GFR 15-29 ml/min (HCC)   . Dilated cardiomyopathy ([place]) 09/17/2015   a. Echo 08/2015: EF 20-25% w. global HK and inferolateral AK  . Esophageal cancer ([place]) 1990  . Esophageal stricture   . Food impaction of esophagus   . Gastroparesis   . Hypertension   . Hypothyroidism   . Inguinal hernia February 2013   right    Past Surgical History:  Procedure Laterality Date  . CARDIAC CATHETERIZATION N/A 10/17/2015   Procedure: Right/Left Heart Cath and Coronary Angiography;  Surgeon: Sherren Mocha, MD;  Location: [facility];  Service: Cardiovascular;  Laterality: N/A;  . COARCTATION OF AORTA REPAIR  2009  . ESOPHAGECTOMY     partial 1997  . ESOPHAGOGASTRODUODENOSCOPY  10/05/2011   Procedure: ESOPHAGOGASTRODUODENOSCOPY (  EGD);  Surgeon: Ladene Artist, MD,FACG;  Location: [facility];  Service: Endoscopy;  Laterality: N/A;  . INGUINAL HERNIA REPAIR  09/01/11   bilateral  . INGUINAL HERNIA REPAIR Left 10/09/2015   Procedure: open femoral hernia repair;  Surgeon: Mickeal Skinner, MD;  Location: [facility];  Service: General;  Laterality: Left;  . SP ENDO REPAIR INFRAREN AAA   8/09   es  . TOTAL HIP ARTHROPLASTY Right 08/06/2012   Procedure: RIGHT TOTAL HIP ARTHOPLASTY WITH ACETABULAR AUTOGRAFT;  Surgeon: Gearlean Alf, MD;  Location: [facility];  Service: Orthopedics;  Laterality: Right;  RIGHT TOTAL HIP ARTHOPLASTY WITH ACETABULAR AUTOGRAFT   . UMBILICAL HERNIA REPAIR    . VAGOTOMY      Current Medications: Outpatient Medications Prior to Visit  Medication Sig Dispense Refill  . aspirin 81 MG tablet Take 81 mg by mouth daily.    [name] Calcium (STOOL SOFTENER PO) Take 2 tablets by mouth daily.    . hydrochlorothiazide (MICROZIDE) 12.5 MG capsule Take 1 capsule (12.5 mg total) by mouth daily. 90 capsule 3  . levothyroxine (SYNTHROID, LEVOTHROID) 88 MCG tablet Take 1 tablet (88 mcg total) by mouth daily. 90 tablet 2  . mirtazapine (REMERON) 15 MG tablet Take 15 mg by mouth at bedtime.    . Omega-3 Fatty Acids (FISH OIL PO) Take 1 capsule by mouth daily.    Marland Kitchen omeprazole (PRILOSEC) 20 MG capsule TAKE 1 CAPSULE DAILY 90 capsule 2  . tamsulosin (FLOMAX) 0.4 MG CAPS capsule TAKE 1 CAPSULE (0.4 MG TOTAL) BY MOUTH DAILY AFTER SUPPER. 90 capsule 3  . traMADol (ULTRAM) 50 MG tablet Take 1-2 tablets (50-100 mg total) by mouth every 6 (six) hours as needed for severe pain. 15 tablet 0  . NIFEdipine (PROCARDIA-XL/ADALAT-CC/NIFEDICAL-XL) 30 MG 24 hr tablet Take 1 tablet (30 mg total) by mouth daily. 30 tablet 6  . furosemide (LASIX) 20 MG tablet Take 1 tablet (20 mg total) by mouth daily as needed (for weight gain greater that 3 lb in 24 hours or 5 lb in 1 week). (Patient not taking: Reported on 02/11/2016) 30 tablet 1  . Transparent Dressings (TEGADERM + PAD 2-3/8"X4") MISC Apply to affected area once a day or as needed. (Patient not taking: Reported on 02/11/2016) 50 each 2  . Wound Dressings (TEGADERM PLUS 4"X5-7/8") MISC Apply 1 each topically as needed. (Patient not taking: Reported on 12/05/2015) 10 each 1   No facility-administered medications prior to visit.        Allergies:   Morphine and related   Social History   Social History  . Marital status: Married    Spouse name: N/A  . Number of children: 5  . Years of education: N/A   Occupational History  . retired    Social History Main Topics  . Smoking status: Former Smoker    Packs/day: 0.50    Years: 22.00    Types: Cigarettes    Quit date: 12/04/1970  . Smokeless tobacco: Never Used  . Alcohol use No  . Drug use: No  . Sexual activity: Not Asked   Other Topics Concern  . None   Social History Narrative  . None     Family History:  The patient's family history includes Aneurysm in his daughter; Arthritis/Rheumatoid in his mother; Glaucoma in his sister; Pancreatic cancer in his father.   ROS:   Please see the history of present illness.    Review of Systems  Constitution: Positive for weight  loss.  Gastrointestinal: Positive for constipation.   All other systems reviewed and are negative.   EKGs/Labs/Other Test Reviewed:    EKG:  EKG is  ordered today.  The ekg ordered today demonstrates Sinus bradycardia, HR 52, normal axis, IVCD, LVH with repolarization abnormality, QTc 450 ms, no change since prior tracing  Recent Labs: 10/09/2015: Magnesium 2.1; TSH 2.754 10/10/2015: ALT 10 10/16/2015: Hemoglobin 10.5; Platelets 172 10/17/2015: BUN 28; Creatinine, Ser 1.58; Potassium 4.2; Sodium 139   Recent Lipid Panel    Component Value Date/Time   CHOL 196 01/31/2013 1639   TRIG 94.0 01/31/2013 1639   HDL 46.20 01/31/2013 1639   CHOLHDL 4 01/31/2013 1639   VLDL 18.8 01/31/2013 1639   LDLCALC 131 (H) 01/31/2013 1639   LDLDIRECT 143.0 12/04/2010 1157     Physical Exam:    VS:  BP (!) 124/40   Pulse (!) 52   Ht 5\' 7"  (1.702 m)   Wt 117 lb 6.4 oz (53.3 kg)   BMI 18.39 kg/m     Wt Readings from Last 3 Encounters:  02/11/16 117 lb 6.4 oz (53.3 kg)  12/05/15 112 lb 12.8 oz (51.2 kg)  11/12/15 112 lb 12.8 oz (51.2 kg)     Physical Exam  Constitutional: He is  oriented to person, place, and time. He appears cachectic. He has a sickly appearance. No distress.  HENT:  Head: Normocephalic and atraumatic.  Eyes: No scleral icterus.  Neck: Normal range of motion. No JVD present.  Cardiovascular: Normal rate, regular rhythm, S1 normal and S2 normal.  Exam reveals no gallop and no friction rub.   Murmur heard.  Harsh crescendo-decrescendo systolic murmur is present with a grade of 2/6  at the upper right sternal border  Decrescendo holodiastolic murmur is present with a grade of 3/6  at the lower left sternal border Pulmonary/Chest: Effort normal and breath sounds normal. He has no wheezes. He has no rhonchi. He has no rales.  Abdominal: Soft. There is no tenderness.  Musculoskeletal: He exhibits no edema.  Neurological: He is alert and oriented to person, place, and time.  Skin: Skin is warm and dry.  Psychiatric: He has a normal mood and affect.    ASSESSMENT:    1. Severe aortic regurgitation   2. Chronic systolic heart failure ([place])   3. Coronary artery disease involving native coronary artery of native heart without angina pectoris    PLAN:    In order of problems listed above:  1. Aortic insufficiency - Patient has severe aortic insufficiency with moderate aortic stenosis and severe 2 vessel CAD. He is not a candidate for surgery and is treated with palliative medical therapy only. He is overall stable on his current medical regimen.  2. Chronic systolic CHF - Volume is overall stable. Continue current medical regimen. He is NYHA 3. He is not on ACE inhibitor secondary to chronic kidney disease. He is not on beta blocker secondary to bradycardia.  3. CAD - No angina. Continue aspirin.    Medication Adjustments/Labs and Tests Ordered: Current medicines are reviewed at length with the patient today.  Concerns regarding medicines are outlined above.  Medication changes, Labs and Tests ordered today are outlined in the Patient Instructions  noted below. Patient Instructions  Medication Instructions:   A REFILL WAS SENT TO [facility] FOR [facility] Labwork: NONE Testing/Procedures: NONE Follow-Up: DR. Burt Knack IN 3 MONTHS  Any Other Special Instructions Will Be Listed Below (If Applicable). If you need a refill on  your cardiac medications before your next appointment, please call your pharmacy.  Signed, Richardson Dopp, PA-C  02/11/2016 2:34 PM    [facility] [address], [place], [place]  [address] Phone: [phone]; Fax: [phone]

## 2016-02-11 ENCOUNTER — Ambulatory Visit (INDEPENDENT_AMBULATORY_CARE_PROVIDER_SITE_OTHER): Payer: Medicare Other | Admitting: Physician Assistant

## 2016-02-11 ENCOUNTER — Encounter: Payer: Self-pay | Admitting: Physician Assistant

## 2016-02-11 VITALS — BP 124/40 | HR 52 | Ht 67.0 in | Wt 117.4 lb

## 2016-02-11 DIAGNOSIS — I351 Nonrheumatic aortic (valve) insufficiency: Secondary | ICD-10-CM | POA: Diagnosis not present

## 2016-02-11 DIAGNOSIS — I2510 Atherosclerotic heart disease of native coronary artery without angina pectoris: Secondary | ICD-10-CM | POA: Diagnosis not present

## 2016-02-11 DIAGNOSIS — I5022 Chronic systolic (congestive) heart failure: Secondary | ICD-10-CM

## 2016-02-11 MED ORDER — NIFEDIPINE ER OSMOTIC RELEASE 30 MG PO TB24: 30.0000 mg | ORAL_TABLET | Freq: Every day | ORAL | 3 refills | Status: AC

## 2016-02-11 NOTE — Patient Instructions (Addendum)
Medication Instructions:   A REFILL WAS SENT TO [facility] FOR [facility] Labwork: NONE Testing/Procedures: NONE Follow-Up: DR. Burt Knack IN 3 MONTHS  Any Other Special Instructions Will Be Listed Below (If Applicable). If you need a refill on your cardiac medications before your next appointment, please call your pharmacy.

## 2016-03-03 ENCOUNTER — Other Ambulatory Visit: Payer: Self-pay

## 2016-03-03 NOTE — Patient Outreach (Signed)
[facility]) Care Management  03/03/2016  ERNAN NAHAR 12-22-1929 [id number]   Telephone call to patient for monthly call. Male answers stating patient not in.  HIPAA compliant message left.   Plan: RN Health Coach will attempt patient again in the month of September.   Jone Baseman, RN, MSN [place] [phone]

## 2016-03-11 ENCOUNTER — Other Ambulatory Visit: Payer: Self-pay

## 2016-03-11 NOTE — Patient Outreach (Signed)
[facility]) Care Management  [place]  03/11/2016   Isaiah Graham 21-Jul-1929 [id number]  Subjective: Telephone call to patient for monthly call. Patient reports he is doing good.  He reports he saw cardiology physician assistant last month and visit went well. Patient reports he is to see Dr. Burt Knack in November for check up. Patient reports that his weight is still the same at 112 lbs.  Patient drinks premiere protein drinks, eats three meals a day, and snacks but he does not gain any weight.  Suggested patient maybe trying supplement twice a day to see if that makes a difference.  He verbalized understanding. Reinforced with patient heart failure symptoms and when to call physician. He verbalized understanding.   Objective:   Encounter Medications:  Outpatient Encounter Prescriptions as of 03/11/2016  Medication Sig  . aspirin 81 MG tablet Take 81 mg by mouth daily.  [name] Calcium (STOOL SOFTENER PO) Take 2 tablets by mouth daily.  . hydrochlorothiazide (MICROZIDE) 12.5 MG capsule Take 1 capsule (12.5 mg total) by mouth daily.  Marland Kitchen levothyroxine (SYNTHROID, LEVOTHROID) 88 MCG tablet Take 1 tablet (88 mcg total) by mouth daily.  . mirtazapine (REMERON) 15 MG tablet Take 15 mg by mouth at bedtime.  Marland Kitchen NIFEdipine (PROCARDIA-XL/ADALAT-CC/NIFEDICAL-XL) 30 MG 24 hr tablet Take 1 tablet (30 mg total) by mouth daily.  . Omega-3 Fatty Acids (FISH OIL PO) Take 1 capsule by mouth daily.  Marland Kitchen omeprazole (PRILOSEC) 20 MG capsule TAKE 1 CAPSULE DAILY  . tamsulosin (FLOMAX) 0.4 MG CAPS capsule TAKE 1 CAPSULE (0.4 MG TOTAL) BY MOUTH DAILY AFTER SUPPER.  . traMADol (ULTRAM) 50 MG tablet Take 1-2 tablets (50-100 mg total) by mouth every 6 (six) hours as needed for severe pain.   No facility-administered encounter medications on file as of 03/11/2016.     Functional Status:  In your present state of health, do you have any difficulty performing the following activities:  01/03/2016 11/12/2015  Hearing? N N  Vision? N N  Difficulty concentrating or making decisions? [name]  Walking or climbing stairs? N N  Dressing or bathing? N N  Doing errands, shopping? N N  Preparing Food and eating ? N N  Using the Toilet? N N  In the past six months, have you accidently leaked urine? N N  Do you have problems with loss of bowel control? N N  Managing your Medications? N N  Managing your Finances? N N  Housekeeping or managing your Housekeeping? N N  Some recent data might be hidden    Fall/Depression Screening: PHQ 2/9 Scores 03/11/2016 01/03/2016 11/02/2015 11/01/2014 07/06/2014 07/13/2013 01/31/2013  PHQ - 2 Score 0 0 0 0 0 0 0    Assessment: Patient continues to benefit from health coach outreach for disease management and support.    Plan:  [facility] CM Care Plan Problem Two   Flowsheet Row Most Recent Value  Care Plan Problem Two  knowledge deficit regarding heart failure  Role Documenting the Problem Two  [place] for Problem Two  Active  Interventions for Problem Two Long Term Goal   RN Health Coach reinforced with patient heart failure management such as daily weights, decreasing sodium intake, and monitoring for swelling to  abdomen, legs,  and feet.    THN Long Term Goal (31-90) days  member will verbalize at least three heart failure managment strategies within the next 45-60 days.Margie Billet Long Term Goal Start Date  03/11/16 [  goal continued]  THN CM Short Term Goal #1 (0-30 days)  member will verbalize heart failure zones and actions associated with the zones within the next 30 days.  THN CM Short Term Goal #1 Start Date  01/31/16  [facility] CM Short Term Goal #1 Met Date   03/11/16  Interventions for Short Term Goal #2   Patient able to describe heart failure signs and symptoms and what to do.        RN Health Coach will contact patient in the month of October and patient agrees to next outreach.  Jone Baseman, RN, MSN [place] [phone]

## 2016-03-15 ENCOUNTER — Other Ambulatory Visit: Payer: Self-pay | Admitting: Family Medicine

## 2016-04-08 ENCOUNTER — Ambulatory Visit: Payer: Self-pay

## 2016-04-20 NOTE — Progress Notes (Signed)
Cardiology Office Note    Date:  04/21/2016   ID:  Isaiah Graham, DOB [birth year], MRN [id number]  PCP:  Eulas Post, MD  Cardiologist:  Dr. Burt Knack  Chief Complaint: dyspnea  History of Present Illness:   Isaiah Graham is a 82 y.o. 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   He underwent right and left heart catheterization in 4/17. This demonstrated severe 2 vessel CAD with total occlusion of the RCA and severe stenosis of LAD with heavy diffuse coronary calcification. He has general failure to thrive with protein calorie malnutrition and very poor functional status over the past 6 months. He was evaluated by [facility] (Dr. Servando Snare). His chances of recovering from major cardiac surgery was felt to be poor. Therefore, he is limited related medical therapy. Last seen by Dr. Burt Knack 11/01/15. Amlodipine was stopped. He was placed on Procardia.   He was doing well on cardiac stand point when last seen by Richardson Dopp, [facility] in clinic 02/11/16.   Added to my schedule for dyspnea. Wife noted exertional shortness of breath for few days. Seems stable. No orthopnea, PND, syncope, LE edema, palpitations, chest pain. Take Aleve PRN for chronic knee pain. No shortness of breat at rest.   Past Medical History:  Diagnosis Date  . Aortic aneurysm, abdominal (HCC)    repair  . Aortic stenosis 09/17/2015   a. Echo 08/2015: Moderate AS  . Arthritis   . Barrett esophagus   . CKD (chronic kidney disease) stage 4, GFR 15-29 ml/min (HCC)   . Dilated cardiomyopathy ([place]) 09/17/2015   a. Echo 08/2015: EF 20-25% w. global HK and inferolateral AK  . Esophageal cancer ([place]) 1990  . Esophageal stricture   . Food impaction of esophagus   . Gastroparesis   . Hypertension   . Hypothyroidism   . Inguinal hernia February 2013   right    Past Surgical History:  Procedure  Laterality Date  . CARDIAC CATHETERIZATION N/A 10/17/2015   Procedure: Right/Left Heart Cath and Coronary Angiography;  Surgeon: Sherren Mocha, MD;  Location: [facility];  Service: Cardiovascular;  Laterality: N/A;  . COARCTATION OF AORTA REPAIR  2009  . ESOPHAGECTOMY     partial 1997  . ESOPHAGOGASTRODUODENOSCOPY  10/05/2011   Procedure: ESOPHAGOGASTRODUODENOSCOPY (EGD);  Surgeon: Ladene Artist, MD,FACG;  Location: [facility];  Service: Endoscopy;  Laterality: N/A;  . INGUINAL HERNIA REPAIR  09/01/11   bilateral  . INGUINAL HERNIA REPAIR Left 10/09/2015   Procedure: open femoral hernia repair;  Surgeon: Mickeal Skinner, MD;  Location: [facility];  Service: General;  Laterality: Left;  . SP ENDO REPAIR INFRAREN AAA  8/09   es  . TOTAL HIP ARTHROPLASTY Right 08/06/2012   Procedure: RIGHT TOTAL HIP ARTHOPLASTY WITH ACETABULAR AUTOGRAFT;  Surgeon: Gearlean Alf, MD;  Location: [facility];  Service: Orthopedics;  Laterality: Right;  RIGHT TOTAL HIP ARTHOPLASTY WITH ACETABULAR AUTOGRAFT   . UMBILICAL HERNIA REPAIR    . VAGOTOMY      Current Medications: Prior to Admission medications   Medication Sig Start Date End Date Taking? Authorizing Provider  aspirin 81 MG tablet Take 81 mg by mouth daily.    Historical Provider, MD  Docusate Calcium (STOOL SOFTENER PO) Take 2 tablets by mouth daily.    Historical Provider, MD  hydrochlorothiazide (MICROZIDE) 12.5 MG capsule Take 1 capsule (  12.5 mg total) by mouth daily. 08/29/15   Eulas Post, MD  levothyroxine (SYNTHROID, LEVOTHROID) 88 MCG tablet Take 1 tablet (88 mcg total) by mouth daily. 09/07/15   Eulas Post, MD  mirtazapine (REMERON) 15 MG tablet TAKE 1 TABLET AT BEDTIME 03/17/16   Eulas Post, MD  NIFEdipine (PROCARDIA-XL/ADALAT-CC/NIFEDICAL-XL) 30 MG 24 hr tablet Take 1 tablet (30 mg total) by mouth daily. 02/11/16   Liliane Shi, PA-C  Omega-3 Fatty Acids (FISH OIL PO) Take 1 capsule by mouth daily.    Historical  Provider, MD  omeprazole (PRILOSEC) 20 MG capsule TAKE 1 CAPSULE DAILY 07/26/15   Eulas Post, MD  tamsulosin (FLOMAX) 0.4 MG CAPS capsule TAKE 1 CAPSULE (0.4 MG TOTAL) BY MOUTH DAILY AFTER SUPPER. 08/29/15   Eulas Post, MD  traMADol (ULTRAM) 50 MG tablet Take 1-2 tablets (50-100 mg total) by mouth every 6 (six) hours as needed for severe pain. 10/12/15   Cherene Altes, MD    Allergies:   Morphine and related   Social History   Social History  . Marital status: Married    Spouse name: N/A  . Number of children: 5  . Years of education: N/A   Occupational History  . retired    Social History Main Topics  . Smoking status: Former Smoker    Packs/day: 0.50    Years: 22.00    Types: Cigarettes    Quit date: 12/04/1970  . Smokeless tobacco: Never Used  . Alcohol use No  . Drug use: No  . Sexual activity: Not Asked   Other Topics Concern  . None   Social History Narrative  . None     Family History:  The patient's family history includes Aneurysm in his daughter; Arthritis/Rheumatoid in his mother; Glaucoma in his sister; Pancreatic cancer in his father.   ROS:   Please see the history of present illness.    ROS All other systems reviewed and are negative.   PHYSICAL EXAM:   VS:  BP 124/60   Pulse 65   Ht 5\' 7"  (1.702 m)   Wt 115 lb (52.2 kg)   BMI 18.01 kg/m    GEN: Thin Frail male in no acute distress  HEENT: normal  Neck: no JVD, carotid bruits, or masses Cardiac: RRR; no murmurs, rubs, or gallops,no edema  Respiratory:  clear to auscultation bilaterally, normal work of breathing GI: soft, nontender, nondistended, + BS MS: no deformity or atrophy  Skin: warm and dry, no rash Neuro:  Alert and Oriented x 3, Strength and sensation are intact Psych: euthymic mood, full affect  Wt Readings from Last 3 Encounters:  04/21/16 115 lb (52.2 kg)  02/11/16 117 lb 6.4 oz (53.3 kg)  12/05/15 112 lb 12.8 oz (51.2 kg)      Studies/Labs Reviewed:   EKG:   EKG is ordered today. Sinus rhythm at rate of 65 bpm with PACs.   Recent Labs: 10/09/2015: Magnesium 2.1; TSH 2.754 10/10/2015: ALT 10 10/16/2015: Hemoglobin 10.5; Platelets 172 10/17/2015: BUN 28; Creatinine, Ser 1.58; Potassium 4.2; Sodium 139   Lipid Panel    Component Value Date/Time   CHOL 196 01/31/2013 1639   TRIG 94.0 01/31/2013 1639   HDL 46.20 01/31/2013 1639   CHOLHDL 4 01/31/2013 1639   VLDL 18.8 01/31/2013 1639   LDLCALC 131 (H) 01/31/2013 1639   LDLDIRECT 143.0 12/04/2010 1157    Additional studies/ records that were reviewed today include:   Right/Left Heart Cath  and Coronary Angiography  10/17/2015  Conclusion    Prox LAD lesion, 80% stenosed.  Mid LAD lesion, 90% stenosed.  Mid Cx lesion, 50% stenosed.  Dist RCA lesion, 100% stenosed.   1. Severe 2 vessel CAD with total occlusion of the RCA and severe stenosis of the LAD with heavy diffuse coronary calcification 2. Dilated aortic root with severe aortic insufficiency 3. Minimal transaortic valve gradient on LV pullback, suggestive of no significant aortic stenosis  Suspect heart failure and cardiomyopathy due to a combination of coronary disease and aortic insufficiency as primary valve lesion. The aortic sinuses appear severely dilated on aortography and there is severe global LV systolic dysfunction with LVEF estimated < 30%.  Will refer to cardiac surgery for evaluation, but I suspect this patient will not be a candidate for surgical intervention.    Stress Dobutamine Stress Echo 09/20/15  ------------------------------------------------------------------- Study Conclusions  - Aortic valve: Possibly bicuspid; moderately thickened, moderately   calcified leaflets. Transvalvular velocity was increased more   than expected due to aortic insufficiency. There was moderate   stenosis. There was moderate to severe regurgitation directed   eccentrically in the LVOT.  Impressions:  - Moderate  &quot;true&quot; aortic stenosis.   The jet is early peaking. While gradients increase appropriately   with inotrope administration, they do not reach severe aortic   stenosis range. Gradients are exaggerated due to aortic   insufficiency-related increase in stroke volume.   There is evidence of severe ischemic cardiomyopathy.   Increased cardiac output and increased aortic valve gradients   confirm the presence of contractile reserve, but 2D imaging was   not performed during inotrope administration. Unable to assess   viability in the areas of myocardial dysfunction. However,   baseline 2D images suggest scarring in the inferior and   inferolateral walls, consistent with prior infarction in the   right coronary artery and left circumflex artery distribution.   Echo 09/13/15 LV EF: 20% -   25%  ------------------------------------------------------------------- Indications:      Murmur (I35.9).  ------------------------------------------------------------------- History:   Risk factors:  Hypothyroidism. CKD stage 4. Former tobacco use. Hypertension.  ------------------------------------------------------------------- Study Conclusions  - Left ventricle: The cavity size was severely dilated. Wall   thickness was normal. Systolic function was severely reduced. The   estimated ejection fraction was in the range of 20% to 25%.   Diffuse hypokinesis. There is akinesis of the inferolateral   myocardium. Doppler parameters are consistent with abnormal left   ventricular relaxation (grade 1 diastolic dysfunction). Doppler   parameters are consistent with high ventricular filling pressure. - Aortic valve: Valve mobility was mildly restricted. There was   moderate stenosis. There was moderate regurgitation. - Mitral valve: Calcified annulus. There was mild regurgitation. - Left atrium: The atrium was severely dilated. - Right atrium: The atrium was mildly dilated. - Pulmonary  arteries: Systolic pressure was moderately increased.   PA peak pressure: 54 mm Hg (S). - Pericardium, extracardiac: A trivial pericardial effusion was   identified. There was a left pleural effusion.  Impressions:  - Global hypokinesis with akinesis of the inferior lateral wall;   overall severely reduced LV function; grade 1 diastolic   dysfunction; severe LVE; calcified aortic valve with moderate AS   (AVA 1.1-1.2 cm2) and moderate AI; mild MR; severe LAE; mild RAE;   mild TR with moderately elevated pulmonary pressure; mildly   dilated aortic root (suggest CTA or MRA to further assess).  ASSESSMENT & PLAN:    1. Aortic  valve insufficiency  - Patient has severe aortic insufficiency with moderate aortic stenosis and severe 2 vessel CAD. He is not a candidate for surgery. Medical management only. Has dyspnea on exertional chronically which app rears at baseline. No PND, orthopnea, LE edema or syncope. Does not requires any lasix PRN. Likely worsening of of disease.   2. Chronic systolic CHF  - He is euvolemic.  Continue current medical regimen. He is NYHA 3. He is not on ACE inhibitor secondary to chronic kidney disease. He is not on beta blocker secondary to bradycardia.  3. CAD - No angina. Continue aspirin. If develops angina consider discontinuation of Nifedipine and add Imdur.     Medication Adjustments/Labs and Tests Ordered: Current medicines are reviewed at length with the patient today.  Concerns regarding medicines are outlined above.  Medication changes, Labs and Tests ordered today are listed in the Patient Instructions below. Patient Instructions  Medication Instructions:  Your physician recommends that you continue on your current medications as directed. Please refer to the Current Medication list given to you today.  Labwork: NONE  Testing/Procedures: NONE  Follow-Up: Your physician recommends that you keep your already scheduled appointment with Dr.  Burt Knack.    If you need a refill on your cardiac medications before your next appointment, please call your pharmacy.      Jarrett Soho, [place]  04/21/2016 2:37 PM    [facility] [place], [place], [place]  [address] Phone: [phone]; Fax: [phone]

## 2016-04-21 ENCOUNTER — Ambulatory Visit (INDEPENDENT_AMBULATORY_CARE_PROVIDER_SITE_OTHER): Payer: Medicare Other | Admitting: Physician Assistant

## 2016-04-21 ENCOUNTER — Encounter: Payer: Self-pay | Admitting: Physician Assistant

## 2016-04-21 ENCOUNTER — Other Ambulatory Visit: Payer: Self-pay | Admitting: Family Medicine

## 2016-04-21 ENCOUNTER — Other Ambulatory Visit: Payer: Self-pay

## 2016-04-21 VITALS — BP 124/60 | HR 65 | Ht 67.0 in | Wt 115.0 lb

## 2016-04-21 DIAGNOSIS — I2510 Atherosclerotic heart disease of native coronary artery without angina pectoris: Secondary | ICD-10-CM

## 2016-04-21 DIAGNOSIS — I5022 Chronic systolic (congestive) heart failure: Secondary | ICD-10-CM

## 2016-04-21 DIAGNOSIS — I351 Nonrheumatic aortic (valve) insufficiency: Secondary | ICD-10-CM

## 2016-04-21 NOTE — Patient Outreach (Signed)
[facility]) Care Management  [place]  04/21/2016   Isaiah Graham Aug 07, 1929 [id number]  Subjective: Telephone call to patient for monthly call. Patient reports he has an appointment at the heart doctor today as his daughter is concerned about him. He thinks he is fine.  He does state however when he goes outside and moving around he has some shortness of breath but he does fine. Patient reports his weight is up a couple of pounds as he has been eating to gain weight. Discussed with patient heart failure zones and getting checked out by the doctor per his daughter's request.  He verbalized understanding.    Objective:   Encounter Medications:  Outpatient Encounter Prescriptions as of 04/21/2016  Medication Sig  . aspirin 81 MG tablet Take 81 mg by mouth daily.  Isaiah Graham Calcium (STOOL SOFTENER PO) Take 2 tablets by mouth daily.  . hydrochlorothiazide (MICROZIDE) 12.5 MG capsule Take 1 capsule (12.5 mg total) by mouth daily.  Marland Kitchen levothyroxine (SYNTHROID, LEVOTHROID) 88 MCG tablet Take 1 tablet (88 mcg total) by mouth daily.  . mirtazapine (REMERON) 15 MG tablet TAKE 1 TABLET AT BEDTIME  . NIFEdipine (PROCARDIA-XL/ADALAT-CC/NIFEDICAL-XL) 30 MG 24 hr tablet Take 1 tablet (30 mg total) by mouth daily.  . Omega-3 Fatty Acids (FISH OIL PO) Take 1 capsule by mouth daily.  Marland Kitchen omeprazole (PRILOSEC) 20 MG capsule TAKE 1 CAPSULE DAILY  . tamsulosin (FLOMAX) 0.4 MG CAPS capsule TAKE 1 CAPSULE (0.4 MG TOTAL) BY MOUTH DAILY AFTER SUPPER.  . traMADol (ULTRAM) 50 MG tablet Take 1-2 tablets (50-100 mg total) by mouth every 6 (six) hours as needed for severe pain.   No facility-administered encounter medications on file as of 04/21/2016.     Functional Status:  In your present state of health, do you have any difficulty performing the following activities: 01/03/2016 11/12/2015  Hearing? N N  Vision? N N  Difficulty concentrating or making decisions? Isaiah Graham  Walking or  climbing stairs? N N  Dressing or bathing? N N  Doing errands, shopping? N N  Preparing Food and eating ? N N  Using the Toilet? N N  In the past six months, have you accidently leaked urine? N N  Do you have problems with loss of bowel control? N N  Managing your Medications? N N  Managing your Finances? N N  Housekeeping or managing your Housekeeping? N N  Some recent data might be hidden    Fall/Depression Screening: PHQ 2/9 Scores 04/21/2016 03/11/2016 01/03/2016 11/02/2015 11/01/2014 07/06/2014 07/13/2013  PHQ - 2 Score 0 0 0 0 0 0 0    Assessment: Patient continues to benefit from health coach outreach for disease management and support.    Plan:  [facility] CM Care Plan Problem Two   Flowsheet Row Most Recent Value  Care Plan Problem Two  knowledge deficit regarding heart failure  Role Documenting the Problem Two  [place] for Problem Two  Active  Interventions for Problem Two Long Term Goal   RN Health Coach reviewed with patient heart failure management such as daily weights, decreasing sodium intake, and monitoring for swelling to  abdomen, legs,  and feet.    THN Long Term Goal (31-90) days  member will verbalize at least three heart failure managment strategies within the next 45-60 days.Isaiah Graham Long Term Goal Start Date  04/21/16 [facility] continued]      [place] will contact patient in the month  of November and patient agrees to next outreach.  Isaiah Baseman, RN, MSN [place] [phone]

## 2016-04-21 NOTE — Patient Instructions (Addendum)
Medication Instructions:  Your physician recommends that you continue on your current medications as directed. Please refer to the Current Medication list given to you today.  Labwork: NONE  Testing/Procedures: NONE  Follow-Up: Your physician recommends that you keep your already scheduled appointment with Dr. Burt Knack.    If you need a refill on your cardiac medications before your next appointment, please call your pharmacy.

## 2016-04-25 ENCOUNTER — Telehealth: Payer: Self-pay | Admitting: Family Medicine

## 2016-04-25 NOTE — Telephone Encounter (Signed)
Daughter states pt is having difficulty breathing when he walks short distances. Pt is fine just sitting still. Pt went to heart dr yesterday and everything was normal.  Family is concerned pt may need to be on O2. She is not with him now, she has just left. Wants to know if we have anyway to monitor his breathing to see if he needs any assistance.  Daughter states better to call her because pt cannot hear very well and gets things confused anyway. She would like a call back to discuss.

## 2016-04-25 NOTE — Telephone Encounter (Signed)
The best option would be to have her and pt come into the office to review her concerns. It has been 6 months since he has been seen. Thanks.

## 2016-04-28 ENCOUNTER — Encounter (HOSPITAL_COMMUNITY): Payer: Self-pay | Admitting: Emergency Medicine

## 2016-04-28 ENCOUNTER — Other Ambulatory Visit: Payer: Self-pay

## 2016-04-28 ENCOUNTER — Emergency Department (HOSPITAL_COMMUNITY): Payer: Medicare Other

## 2016-04-28 ENCOUNTER — Inpatient Hospital Stay (HOSPITAL_COMMUNITY)
Admission: EM | Admit: 2016-04-28 | Discharge: 2016-05-23 | DRG: 291 | Disposition: E | Payer: Medicare Other | Attending: Internal Medicine | Admitting: Internal Medicine

## 2016-04-28 DIAGNOSIS — Z66 Do not resuscitate: Secondary | ICD-10-CM | POA: Diagnosis present

## 2016-04-28 DIAGNOSIS — Z888 Allergy status to other drugs, medicaments and biological substances status: Secondary | ICD-10-CM | POA: Diagnosis not present

## 2016-04-28 DIAGNOSIS — R778 Other specified abnormalities of plasma proteins: Secondary | ICD-10-CM

## 2016-04-28 DIAGNOSIS — R748 Abnormal levels of other serum enzymes: Secondary | ICD-10-CM | POA: Diagnosis present

## 2016-04-28 DIAGNOSIS — Z87891 Personal history of nicotine dependence: Secondary | ICD-10-CM

## 2016-04-28 DIAGNOSIS — R0902 Hypoxemia: Secondary | ICD-10-CM | POA: Insufficient documentation

## 2016-04-28 DIAGNOSIS — N184 Chronic kidney disease, stage 4 (severe): Secondary | ICD-10-CM | POA: Diagnosis not present

## 2016-04-28 DIAGNOSIS — Z7982 Long term (current) use of aspirin: Secondary | ICD-10-CM | POA: Diagnosis not present

## 2016-04-28 DIAGNOSIS — I351 Nonrheumatic aortic (valve) insufficiency: Secondary | ICD-10-CM | POA: Diagnosis present

## 2016-04-28 DIAGNOSIS — R627 Adult failure to thrive: Secondary | ICD-10-CM | POA: Diagnosis present

## 2016-04-28 DIAGNOSIS — Z79899 Other long term (current) drug therapy: Secondary | ICD-10-CM | POA: Diagnosis not present

## 2016-04-28 DIAGNOSIS — E872 Acidosis: Secondary | ICD-10-CM | POA: Diagnosis present

## 2016-04-28 DIAGNOSIS — J181 Lobar pneumonia, unspecified organism: Secondary | ICD-10-CM | POA: Diagnosis not present

## 2016-04-28 DIAGNOSIS — R64 Cachexia: Secondary | ICD-10-CM | POA: Diagnosis present

## 2016-04-28 DIAGNOSIS — Z8261 Family history of arthritis: Secondary | ICD-10-CM | POA: Diagnosis not present

## 2016-04-28 DIAGNOSIS — I352 Nonrheumatic aortic (valve) stenosis with insufficiency: Secondary | ICD-10-CM | POA: Diagnosis present

## 2016-04-28 DIAGNOSIS — L8990 Pressure ulcer of unspecified site, unspecified stage: Secondary | ICD-10-CM | POA: Diagnosis present

## 2016-04-28 DIAGNOSIS — J189 Pneumonia, unspecified organism: Secondary | ICD-10-CM | POA: Diagnosis present

## 2016-04-28 DIAGNOSIS — Z96641 Presence of right artificial hip joint: Secondary | ICD-10-CM | POA: Diagnosis present

## 2016-04-28 DIAGNOSIS — I130 Hypertensive heart and chronic kidney disease with heart failure and stage 1 through stage 4 chronic kidney disease, or unspecified chronic kidney disease: Principal | ICD-10-CM | POA: Diagnosis present

## 2016-04-28 DIAGNOSIS — I252 Old myocardial infarction: Secondary | ICD-10-CM

## 2016-04-28 DIAGNOSIS — Z800 Family history of malignant neoplasm of digestive organs: Secondary | ICD-10-CM

## 2016-04-28 DIAGNOSIS — R7989 Other specified abnormal findings of blood chemistry: Secondary | ICD-10-CM

## 2016-04-28 DIAGNOSIS — Z885 Allergy status to narcotic agent status: Secondary | ICD-10-CM

## 2016-04-28 DIAGNOSIS — I5043 Acute on chronic combined systolic (congestive) and diastolic (congestive) heart failure: Secondary | ICD-10-CM | POA: Diagnosis not present

## 2016-04-28 DIAGNOSIS — R001 Bradycardia, unspecified: Secondary | ICD-10-CM | POA: Diagnosis present

## 2016-04-28 DIAGNOSIS — Z681 Body mass index (BMI) 19 or less, adult: Secondary | ICD-10-CM

## 2016-04-28 DIAGNOSIS — E43 Unspecified severe protein-calorie malnutrition: Secondary | ICD-10-CM | POA: Diagnosis present

## 2016-04-28 DIAGNOSIS — N179 Acute kidney failure, unspecified: Secondary | ICD-10-CM | POA: Diagnosis present

## 2016-04-28 DIAGNOSIS — J9621 Acute and chronic respiratory failure with hypoxia: Secondary | ICD-10-CM | POA: Diagnosis present

## 2016-04-28 DIAGNOSIS — Z83511 Family history of glaucoma: Secondary | ICD-10-CM | POA: Diagnosis not present

## 2016-04-28 DIAGNOSIS — I10 Essential (primary) hypertension: Secondary | ICD-10-CM | POA: Diagnosis present

## 2016-04-28 DIAGNOSIS — J9601 Acute respiratory failure with hypoxia: Secondary | ICD-10-CM | POA: Diagnosis not present

## 2016-04-28 DIAGNOSIS — I350 Nonrheumatic aortic (valve) stenosis: Secondary | ICD-10-CM | POA: Diagnosis not present

## 2016-04-28 DIAGNOSIS — R0600 Dyspnea, unspecified: Secondary | ICD-10-CM

## 2016-04-28 DIAGNOSIS — I2510 Atherosclerotic heart disease of native coronary artery without angina pectoris: Secondary | ICD-10-CM | POA: Diagnosis present

## 2016-04-28 DIAGNOSIS — I459 Conduction disorder, unspecified: Secondary | ICD-10-CM | POA: Diagnosis present

## 2016-04-28 DIAGNOSIS — I255 Ischemic cardiomyopathy: Secondary | ICD-10-CM | POA: Diagnosis present

## 2016-04-28 DIAGNOSIS — J9620 Acute and chronic respiratory failure, unspecified whether with hypoxia or hypercapnia: Secondary | ICD-10-CM

## 2016-04-28 DIAGNOSIS — Z515 Encounter for palliative care: Secondary | ICD-10-CM

## 2016-04-28 DIAGNOSIS — R0609 Other forms of dyspnea: Secondary | ICD-10-CM | POA: Diagnosis not present

## 2016-04-28 DIAGNOSIS — E039 Hypothyroidism, unspecified: Secondary | ICD-10-CM | POA: Diagnosis present

## 2016-04-28 DIAGNOSIS — Z9889 Other specified postprocedural states: Secondary | ICD-10-CM

## 2016-04-28 DIAGNOSIS — I5023 Acute on chronic systolic (congestive) heart failure: Secondary | ICD-10-CM | POA: Diagnosis not present

## 2016-04-28 DIAGNOSIS — Z8501 Personal history of malignant neoplasm of esophagus: Secondary | ICD-10-CM

## 2016-04-28 LAB — COMPREHENSIVE METABOLIC PANEL
ALBUMIN: 3.4 g/dL — AB (ref 3.5–5.0)
ALK PHOS: 85 U/L (ref 38–126)
ALK PHOS: 77 U/L (ref 38–126)
ALT: 35 U/L (ref 17–63)
ALT: 29 U/L (ref 17–63)
ANION GAP: 14 (ref 5–15)
AST: 40 U/L (ref 15–41)
AST: 30 U/L (ref 15–41)
Albumin: 3.9 g/dL (ref 3.5–5.0)
Anion gap: 13 (ref 5–15)
BILIRUBIN TOTAL: 0.7 mg/dL (ref 0.3–1.2)
BUN: 98 mg/dL — ABNORMAL HIGH (ref 6–20)
BUN: 100 mg/dL — AB (ref 6–20)
CALCIUM: 10.9 mg/dL — AB (ref 8.9–10.3)
CALCIUM: 10.4 mg/dL — AB (ref 8.9–10.3)
CO2: 20 mmol/L — AB (ref 22–32)
CO2: 20 mmol/L — AB (ref 22–32)
Chloride: 108 mmol/L (ref 101–111)
Chloride: 107 mmol/L (ref 101–111)
Creatinine, Ser: 2.27 mg/dL — ABNORMAL HIGH (ref 0.61–1.24)
Creatinine, Ser: 2.31 mg/dL — ABNORMAL HIGH (ref 0.61–1.24)
GFR calc Af Amer: 28 mL/min — ABNORMAL LOW (ref >60)
GFR calc non Af Amer: 25 mL/min — ABNORMAL LOW (ref >60)
GFR calc non Af Amer: 24 mL/min — ABNORMAL LOW (ref >60)
GFR, EST AFRICAN AMERICAN: 29 mL/min — AB (ref >60)
GLUCOSE: 135 mg/dL — AB (ref 65–99)
Glucose, Bld: 122 mg/dL — ABNORMAL HIGH (ref 65–99)
Potassium: 4.4 mmol/L (ref 3.5–5.1)
Potassium: 3.9 mmol/L (ref 3.5–5.1)
SODIUM: 142 mmol/L (ref 135–145)
SODIUM: 140 mmol/L (ref 135–145)
TOTAL PROTEIN: 7.4 g/dL (ref 6.5–8.1)
TOTAL PROTEIN: 6.7 g/dL (ref 6.5–8.1)
Total Bilirubin: 0.9 mg/dL (ref 0.3–1.2)

## 2016-04-28 LAB — CBC WITH DIFFERENTIAL/PLATELET
BASOS ABS: 0 10*3/uL (ref 0.0–0.1)
BASOS PCT: 0 %
Basophils Absolute: 0 10*3/uL (ref 0.0–0.1)
Basophils Relative: 0 %
EOS PCT: 0 %
Eosinophils Absolute: 0 10*3/uL (ref 0.0–0.7)
Eosinophils Absolute: 0 10*3/uL (ref 0.0–0.7)
Eosinophils Relative: 0 %
HCT: 39.8 % (ref 39.0–52.0)
HEMATOCRIT: 37.2 % — AB (ref 39.0–52.0)
Hemoglobin: 13.6 g/dL (ref 13.0–17.0)
Hemoglobin: 12.5 g/dL — ABNORMAL LOW (ref 13.0–17.0)
LYMPHS PCT: 15 %
Lymphocytes Relative: 15 %
Lymphs Abs: 0.8 10*3/uL (ref 0.7–4.0)
Lymphs Abs: 0.9 10*3/uL (ref 0.7–4.0)
MCH: 31.1 pg (ref 26.0–34.0)
MCH: 30.4 pg (ref 26.0–34.0)
MCHC: 34.2 g/dL (ref 30.0–36.0)
MCHC: 33.6 g/dL (ref 30.0–36.0)
MCV: 90.9 fL (ref 78.0–100.0)
MCV: 90.5 fL (ref 78.0–100.0)
MONO ABS: 0.6 10*3/uL (ref 0.1–1.0)
MONOS PCT: 9 %
Monocytes Absolute: 0.3 10*3/uL (ref 0.1–1.0)
Monocytes Relative: 5 %
NEUTROS ABS: 4.7 10*3/uL (ref 1.7–7.7)
Neutro Abs: 4.3 10*3/uL (ref 1.7–7.7)
Neutrophils Relative %: 80 %
Neutrophils Relative %: 76 %
PLATELETS: 181 10*3/uL (ref 150–400)
Platelets: 173 10*3/uL (ref 150–400)
RBC: 4.38 MIL/uL (ref 4.22–5.81)
RBC: 4.11 MIL/uL — ABNORMAL LOW (ref 4.22–5.81)
RDW: 14 % (ref 11.5–15.5)
RDW: 14 % (ref 11.5–15.5)
WBC: 5.4 10*3/uL (ref 4.0–10.5)
WBC: 6.2 10*3/uL (ref 4.0–10.5)

## 2016-04-28 LAB — I-STAT ARTERIAL BLOOD GAS, ED
Acid-base deficit: 2 mmol/L (ref 0.0–2.0)
BICARBONATE: 19.9 mmol/L — AB (ref 20.0–28.0)
O2 SAT: 88 %
PCO2 ART: 25.3 mmHg — AB (ref 32.0–48.0)
PO2 ART: 48 mmHg — AB (ref 83.0–108.0)
Patient temperature: 98.6
TCO2: 21 mmol/L (ref 0–100)
pH, Arterial: 7.503 — ABNORMAL HIGH (ref 7.350–7.450)

## 2016-04-28 LAB — TROPONIN I
TROPONIN I: 0.98 ng/mL — AB (ref <0.03)
Troponin I: 0.59 ng/mL (ref <0.03)
Troponin I: 0.92 ng/mL (ref <0.03)

## 2016-04-28 LAB — I-STAT CG4 LACTIC ACID, ED
Lactic Acid, Venous: 2.04 mmol/L (ref 0.5–1.9)
Lactic Acid, Venous: 1.05 mmol/L (ref 0.5–1.9)

## 2016-04-28 LAB — BRAIN NATRIURETIC PEPTIDE: B Natriuretic Peptide: >4500 pg/mL — ABNORMAL HIGH (ref 0.0–100.0)

## 2016-04-28 LAB — I-STAT TROPONIN, ED: Troponin i, poc: 0.36 ng/mL (ref 0.00–0.08)

## 2016-04-28 LAB — TSH: TSH: 2.804 u[IU]/mL (ref 0.350–4.500)

## 2016-04-28 LAB — MRSA PCR SCREENING: MRSA by PCR: NEGATIVE

## 2016-04-28 LAB — STREP PNEUMONIAE URINARY ANTIGEN: Strep Pneumo Urinary Antigen: NEGATIVE

## 2016-04-28 MED ORDER — TAMSULOSIN HCL 0.4 MG PO CAPS
0.4000 mg | ORAL_CAPSULE | Freq: Every day | ORAL | Status: DC
  Administered 2016-04-28 – 2016-04-29 (×2): 0.4 mg via ORAL
  Filled 2016-04-28 (×2): qty 1

## 2016-04-28 MED ORDER — HYDROCHLOROTHIAZIDE 12.5 MG PO CAPS
12.5000 mg | ORAL_CAPSULE | Freq: Every day | ORAL | Status: DC
  Administered 2016-04-28: 12.5 mg via ORAL
  Filled 2016-04-28: qty 1

## 2016-04-28 MED ORDER — ONDANSETRON HCL 4 MG PO TABS
4.0000 mg | ORAL_TABLET | Freq: Four times a day (QID) | ORAL | Status: DC | PRN
Start: 2016-04-28 — End: 2016-05-02

## 2016-04-28 MED ORDER — NIFEDIPINE ER OSMOTIC RELEASE 30 MG PO TB24
30.0000 mg | ORAL_TABLET | Freq: Every day | ORAL | Status: DC
  Administered 2016-04-29: 30 mg via ORAL
  Filled 2016-04-28 (×4): qty 1

## 2016-04-28 MED ORDER — DEXTROSE 5 % IV SOLN
1.0000 g | INTRAVENOUS | Status: DC
  Administered 2016-04-29 – 2016-04-30 (×2): 1 g via INTRAVENOUS
  Filled 2016-04-28 (×3): qty 10

## 2016-04-28 MED ORDER — ONDANSETRON HCL 4 MG/2ML IJ SOLN: 4.0000 mg | Freq: Four times a day (QID) | INTRAMUSCULAR | Status: DC | PRN

## 2016-04-28 MED ORDER — ENOXAPARIN SODIUM 30 MG/0.3ML ~~LOC~~ SOLN
30.0000 mg | SUBCUTANEOUS | Status: DC
  Administered 2016-04-28 – 2016-04-29 (×2): 30 mg via SUBCUTANEOUS
  Filled 2016-04-28 (×2): qty 0.3

## 2016-04-28 MED ORDER — ASPIRIN 81 MG PO CHEW
81.0000 mg | CHEWABLE_TABLET | Freq: Every day | ORAL | Status: DC
  Administered 2016-04-28 – 2016-04-29 (×2): 81 mg via ORAL
  Filled 2016-04-28 (×2): qty 1

## 2016-04-28 MED ORDER — ASPIRIN 81 MG PO TABS
81.0000 mg | ORAL_TABLET | Freq: Every day | ORAL | Status: DC
Start: 2016-04-28 — End: 2016-04-28

## 2016-04-28 MED ORDER — AZITHROMYCIN 500 MG IV SOLR
500.0000 mg | Freq: Once | INTRAVENOUS | Status: AC
  Administered 2016-04-28: 500 mg via INTRAVENOUS
  Filled 2016-04-28: qty 500

## 2016-04-28 MED ORDER — AZITHROMYCIN 500 MG IV SOLR
500.0000 mg | INTRAVENOUS | Status: DC
Start: 2016-04-29 — End: 2016-04-30
  Administered 2016-04-29 – 2016-04-30 (×2): 500 mg via INTRAVENOUS
  Filled 2016-04-28 (×3): qty 500

## 2016-04-28 MED ORDER — ACETAMINOPHEN 650 MG RE SUPP: 650.0000 mg | Freq: Four times a day (QID) | RECTAL | Status: DC | PRN

## 2016-04-28 MED ORDER — ACETAMINOPHEN 325 MG PO TABS: 650.0000 mg | ORAL_TABLET | Freq: Four times a day (QID) | ORAL | Status: DC | PRN

## 2016-04-28 MED ORDER — DEXTROSE 5 % IV SOLN
1.0000 g | Freq: Once | INTRAVENOUS | Status: AC
  Administered 2016-04-28: 1 g via INTRAVENOUS
  Filled 2016-04-28: qty 10

## 2016-04-28 MED ORDER — OMEGA-3-ACID ETHYL ESTERS 1 G PO CAPS
2.0000 g | ORAL_CAPSULE | Freq: Every day | ORAL | Status: DC
  Administered 2016-04-28 – 2016-04-29 (×2): 2 g via ORAL
  Filled 2016-04-28 (×2): qty 2

## 2016-04-28 MED ORDER — MIRTAZAPINE 15 MG PO TABS
15.0000 mg | ORAL_TABLET | Freq: Every day | ORAL | Status: DC
  Administered 2016-04-28 – 2016-04-29 (×2): 15 mg via ORAL
  Filled 2016-04-28 (×2): qty 1

## 2016-04-28 MED ORDER — DOCUSATE SODIUM 100 MG PO CAPS
200.0000 mg | ORAL_CAPSULE | Freq: Every day | ORAL | Status: DC
  Administered 2016-04-28 – 2016-04-29 (×2): 200 mg via ORAL
  Filled 2016-04-28 (×2): qty 2

## 2016-04-28 MED ORDER — PANTOPRAZOLE SODIUM 40 MG PO TBEC
40.0000 mg | DELAYED_RELEASE_TABLET | Freq: Every day | ORAL | Status: DC
  Administered 2016-04-28 – 2016-04-29 (×2): 40 mg via ORAL
  Filled 2016-04-28 (×2): qty 1

## 2016-04-28 MED ORDER — LEVOTHYROXINE SODIUM 88 MCG PO TABS
88.0000 ug | ORAL_TABLET | Freq: Every day | ORAL | Status: DC
  Administered 2016-04-28 – 2016-04-29 (×2): 88 ug via ORAL
  Filled 2016-04-28 (×4): qty 1

## 2016-04-28 NOTE — ED Notes (Signed)
ED Provider at bedside. 

## 2016-04-28 NOTE — Progress Notes (Signed)
Pt placed on BIPAP per MD order.  Pt tolerating well at this time.  RT will continue to monitor.  

## 2016-04-28 NOTE — ED Triage Notes (Signed)
   Dr. Dina Rich called to briefly discuss the workup and management for Isaiah Graham.  The patient has chronic systolic heart failure, severe AI, moderate AS, and severe two-vessel CAD followed by Dr. Burt Knack.  During his last cardiology encounter on 8/21 in was noted that Isaiah Graham was not a candidate for either revascularization or aortic valve procedures.  Palliative medical therapy that is limited by his CKD and baseline bradycardia.  This morning he presented with acute on chronic SOB and hypoxia with evidence of multilobar pneumonia on CXR.  In this setting he has a mild troponin elevation, but denies CP and does not have ischemic ECG changes.  Dr. Burt Ek plans to complete his infectious w/u and call the hospitalist service for admission. She requested that the patient be added to the cardiology list to be seen as a daytime consult should the hospitalist service request this.  I agreed with the current plan of care, and will add the patient to the cardiology list should the primary team decide to pursue a formal consult later today.  Clayborne Dana MD

## 2016-04-28 NOTE — Progress Notes (Signed)
CRITICAL VALUE ALERT  Critical value received: troponin 0.59  Date of notification:  05/16/2016  Time of notification:  0920  Critical value read back:yes  Nurse who received alert:  Anthony Sar RN  MD notified (1st page):  [place]  Time of first page:  0932  MD notified (2nd page):  Time of second page:  Responding MD:    Time MD responded:

## 2016-04-28 NOTE — Progress Notes (Signed)
[facility] TEAM 1 - Stepdown/ICU TEAM  Isaiah Graham  [id number] DOB: 1929/12/09 DOA: 04/25/2016 PCP: Eulas Post, MD    Brief Narrative:  82 y.o. male with CAD, systolic heart failure (EF 20-25% March 2017), hypertension, moderate aortic stenosis, and severe aortic insufficiency who was brought to the ER complaining of increasing shortness of breath w/o chest pain over 3 weeks.    Subjective: Pt is seen for a f/u visit.    Assessment & Plan:  Acute hypoxic respiratory failure with hypoxia probably a combination of CHF and pneumonia  Chronic systolic CHF  EF 0000000 - baseline wgt ~52kg - no BB due to bradycardia - no ACEi/ARB due to CKD  Filed Weights   05/07/2016 0306  Weight: 50.8 kg (112 lb)    CAD Underwent right and left heart catheterization in 4/17 which demonstrated severe 2 vessel CAD with total occlusion of the RCA and severe stenosis of LAD with heavy diffuse calcification - limited to medical tx only   Hypertension may have to hold antihypertensive if  the patient's blood pressure remains low  Chronic kidney disease stage IV  Recent Labs Lab 04/25/2016 0338 05/18/2016 0754  CREATININE 2.27* 2.31*    History of aortic stenosis moderate with severe aortic insufficiency   Hypothyroidism on Synthroid  History of esophageal cancer status post surgery  DVT prophylaxis: lovenox  Code Status: FULL CODE Family Communication: no family present at time of exam  Disposition Plan:   Consultants:  [facility]   Procedures: none  Antimicrobials:  Ceftriaxone 11/5 > Azithromycin 11/5 >  Objective: Blood pressure 113/60, pulse 66, temperature 97.4 F (36.3 C), temperature source Axillary, resp. rate (!) 25, height 5\' 7"  (1.702 m), weight 50.8 kg (112 lb), SpO2 98 %.  Intake/Output Summary (Last 24 hours) at 05/02/2016 1137 Last data filed at 05/08/2016 0610  Gross per 24 hour  Intake              300 ml  Output                0 ml  Net               300 ml   Filed Weights   05/06/2016 0306  Weight: 50.8 kg (112 lb)    Examination: Pt was seen for a f/u visit.    CBC:  Recent Labs Lab 05/22/2016 0338 04/26/2016 0754  WBC 5.4 6.2  NEUTROABS 4.3 4.7  HGB 13.6 12.5*  HCT 39.8 37.2*  MCV 90.9 90.5  PLT 173 0000000   Basic Metabolic Panel:  Recent Labs Lab 05/11/2016 0338 05/04/2016 0754  NA 142 140  K 4.4 3.9  CL 108 107  CO2 20* 20*  GLUCOSE 122* 135*  BUN 98* 100*  CREATININE 2.27* 2.31*  CALCIUM 10.9* 10.4*   GFR: Estimated Creatinine Clearance: 17.1 mL/min (by C-G formula based on SCr of 2.31 mg/dL (H)).  Liver Function Tests:  Recent Labs Lab 04/25/2016 0338 05/21/2016 0754  AST 40 30  ALT 35 29  ALKPHOS 85 77  BILITOT 0.9 0.7  PROT 7.4 6.7  ALBUMIN 3.9 3.4*    Cardiac Enzymes:  Recent Labs Lab 04/29/2016 0754  TROPONINI 0.59*    HbA1C: Hgb A1c MFr Bld  Date/Time Value Ref Range Status  10/11/2015 05:15 AM 5.7 (H) 4.8 - 5.6 % Final    Comment:    (NOTE)         Pre-diabetes: 5.7 - 6.4  Diabetes: >6.4         Glycemic control for adults with diabetes: <7.0     Recent Results (from the past 240 hour(s))  MRSA PCR Screening     Status: None   Collection Time: 04/30/2016  7:54 AM  Result Value Ref Range Status   MRSA by PCR NEGATIVE NEGATIVE Final    Comment:        The GeneXpert MRSA Assay (FDA approved for NASAL specimens only), is one component of a comprehensive MRSA colonization surveillance program. It is not intended to diagnose MRSA infection nor to guide or monitor treatment for MRSA infections.      Scheduled Meds: . aspirin  81 mg Oral Daily  . [START ON 04/29/2016] azithromycin  500 mg Intravenous Q24H  . [START ON 04/29/2016] cefTRIAXone (ROCEPHIN)  IV  1 g Intravenous Q24H  . docusate sodium  200 mg Oral Daily  . enoxaparin (LOVENOX) injection  30 mg Subcutaneous Q24H  . hydrochlorothiazide  12.5 mg Oral Daily  . levothyroxine  88 mcg Oral QAC breakfast  .  mirtazapine  15 mg Oral QHS  . NIFEdipine  30 mg Oral Daily  . omega-3 acid ethyl esters  2 g Oral Daily  . pantoprazole  40 mg Oral Daily  . tamsulosin  0.4 mg Oral QPC supper     LOS: 0 days   Time spent: No Charge  Cherene Altes, MD [facility] Office  [phone] Pager - Text Page per Amion as per below:  On-Call/Text Page:      [email or web address]      password [email or web address]  If 7PM-7AM, please contact night-coverage [email or web address] Password [email or web address] 05/12/2016, 11:37 AM

## 2016-04-28 NOTE — ED Provider Notes (Signed)
[place] DEPT Provider Note   CSN: [id number] Arrival date & time: 05/19/2016  0256  By signing my name below, I, Emmanuella Mensah, attest that this documentation has been prepared under the direction and in the presence of Merryl Hacker, MD. Electronically Signed: Judithann Sauger, ED Scribe. 05/03/2016. 3:16 AM.   History   Chief Complaint Chief Complaint  Patient presents with  . Shortness of Breath   HPI Comments: Isaiah Graham is a 82 y.o. male with a hx of CHF (5-6 months ago), hypertension, hypothyroidism, CKD brought in by ambulance, who presents to the Emergency Department complaining of shortness of breath onset 2-3 days ago. He notes that his SOB is worse with laying flat. No alleviating factors noted. He has not tried any medications PTA. Pt has an allergy to Morphine. No home O2 use. He denies any chest pain, fever, leg swelling, cough, or any other symptoms. Denies fever.  Seen by cardiology earlier in the week for worsening dyspnea. Thought to be at his baseline. He has known aortic insufficiency and coronary artery disease not amenable to surgery given that he is a poor surgical candidate.  The history is provided by the patient. No language interpreter was used.    Past Medical History:  Diagnosis Date  . Aortic aneurysm, abdominal (HCC)    repair  . Aortic stenosis 09/17/2015   a. Echo 08/2015: Moderate AS  . Arthritis   . Barrett esophagus   . CKD (chronic kidney disease) stage 4, GFR 15-29 ml/min (HCC)   . Dilated cardiomyopathy ([place]) 09/17/2015   a. Echo 08/2015: EF 20-25% w. global HK and inferolateral AK  . Esophageal cancer ([place]) 1990  . Esophageal stricture   . Food impaction of esophagus   . Gastroparesis   . Hypertension   . Hypothyroidism   . Inguinal hernia February 2013   right    Patient Active Problem List   Diagnosis Date Noted  . Coronary artery disease involving native heart without angina pectoris 02/10/2016  . Loss of  weight 10/22/2015  . Chronic systolic (congestive) heart failure 10/17/2015  . Chronic systolic heart failure ([place])   . Chronic combined systolic and diastolic heart failure ([place]) 10/09/2015  . History of esophageal cancer 10/09/2015  . H/O esophagectomy 10/09/2015  . Aortic stenosis 09/17/2015  . Aortic insufficiency 09/17/2015  . Dilated cardiomyopathy ([place]) 09/17/2015  . CKD (chronic kidney disease) stage 4, GFR 15-29 ml/min (HCC) 11/01/2014  . BPH (benign prostatic hyperplasia) 09/30/2012  . Decubitus ulcer of sacral region, stage 1 08/10/2012  . Postoperative anemia due to acute blood loss 08/09/2012  . Postop Hyponatremia 08/09/2012  . Postop Transfusion 08/09/2012  . OA (osteoarthritis) of hip 08/06/2012  . Osteoarthritis of right hip 07/04/2012  . Stricture and stenosis of esophagus 10/05/2011  . Foreign body in esophagus 10/05/2011  . Other dysphagia 10/05/2011  . Inguinal hernia, right repaired 09/01/2011  08/26/2011  . CONSTIPATION 12/21/2009  . GERD 07/24/2009  . Hypothyroidism 02/19/2009  . Essential hypertension 02/19/2009  . PERSONAL HISTORY MALIGNANT NEOPLASM ESOPHAGUS 02/19/2009    Past Surgical History:  Procedure Laterality Date  . CARDIAC CATHETERIZATION N/A 10/17/2015   Procedure: Right/Left Heart Cath and Coronary Angiography;  Surgeon: Sherren Mocha, MD;  Location: [facility];  Service: Cardiovascular;  Laterality: N/A;  . COARCTATION OF AORTA REPAIR  2009  . ESOPHAGECTOMY     partial 1997  . ESOPHAGOGASTRODUODENOSCOPY  10/05/2011   Procedure: ESOPHAGOGASTRODUODENOSCOPY (EGD);  Surgeon: Ladene Artist, MD,FACG;  Location: [facility];  Service: Endoscopy;  Laterality: N/A;  . INGUINAL HERNIA REPAIR  09/01/11   bilateral  . INGUINAL HERNIA REPAIR Left 10/09/2015   Procedure: open femoral hernia repair;  Surgeon: Mickeal Skinner, MD;  Location: [facility];  Service: General;  Laterality: Left;  . SP ENDO REPAIR INFRAREN AAA  8/09   es  . TOTAL HIP  ARTHROPLASTY Right 08/06/2012   Procedure: RIGHT TOTAL HIP ARTHOPLASTY WITH ACETABULAR AUTOGRAFT;  Surgeon: Gearlean Alf, MD;  Location: [facility];  Service: Orthopedics;  Laterality: Right;  RIGHT TOTAL HIP ARTHOPLASTY WITH ACETABULAR AUTOGRAFT   . UMBILICAL HERNIA REPAIR    . VAGOTOMY         Home Medications    Prior to Admission medications   Medication Sig Start Date End Date Taking? Authorizing Provider  aspirin 81 MG tablet Take 81 mg by mouth daily.   Yes Historical Provider, MD  docusate sodium (COLACE) 100 MG capsule Take 200 mg by mouth daily.   Yes Historical Provider, MD  hydrochlorothiazide (MICROZIDE) 12.5 MG capsule Take 1 capsule (12.5 mg total) by mouth daily. 08/29/15  Yes Eulas Post, MD  levothyroxine (SYNTHROID, LEVOTHROID) 88 MCG tablet Take 1 tablet (88 mcg total) by mouth daily. 09/07/15  Yes Eulas Post, MD  mirtazapine (REMERON) 15 MG tablet TAKE 1 TABLET AT BEDTIME 03/17/16  Yes Eulas Post, MD  NIFEdipine (PROCARDIA-XL/ADALAT-CC/NIFEDICAL-XL) 30 MG 24 hr tablet Take 1 tablet (30 mg total) by mouth daily. 02/11/16  Yes Liliane Shi, PA-C  Omega-3 Fatty Acids (FISH OIL PO) Take 1 capsule by mouth daily.   Yes Historical Provider, MD  omeprazole (PRILOSEC) 20 MG capsule TAKE 1 CAPSULE DAILY 04/22/16  Yes Eulas Post, MD  Probiotic Product (PROBIOTIC COLON SUPPORT) CAPS Take 1 capsule by mouth every evening.   Yes Historical Provider, MD  tamsulosin (FLOMAX) 0.4 MG CAPS capsule TAKE 1 CAPSULE (0.4 MG TOTAL) BY MOUTH DAILY AFTER SUPPER. 08/29/15  Yes Eulas Post, MD  traMADol (ULTRAM) 50 MG tablet Take 1-2 tablets (50-100 mg total) by mouth every 6 (six) hours as needed for severe pain. Patient not taking: Reported on 05/13/2016 10/12/15   Cherene Altes, MD    Family History Family History  Problem Relation Age of Onset  . Pancreatic cancer Father   . Glaucoma Sister   . Arthritis/Rheumatoid Mother   . Aneurysm Daughter     brain     Social History Social History  Substance Use Topics  . Smoking status: Former Smoker    Packs/day: 0.50    Years: 22.00    Types: Cigarettes    Quit date: 12/04/1970  . Smokeless tobacco: Never Used  . Alcohol use No     Allergies   Morphine and related   Review of Systems Review of Systems  Constitutional: Negative for fever.  Respiratory: Positive for shortness of breath. Negative for cough.   Cardiovascular: Negative for chest pain and leg swelling.  Gastrointestinal: Negative for abdominal pain, nausea and vomiting.  All other systems reviewed and are negative.    Physical Exam Updated Vital Signs BP 124/61   Pulse (!) 37   Temp 97.6 F (36.4 C) (Rectal)   Resp 16   Ht 5\' 7"  (1.702 m)   Wt 112 lb (50.8 kg)   SpO2 91%   BMI 17.54 kg/m   Physical Exam  Constitutional: He is oriented to person, place, and time.  Elderly, chronically ill-appearing  HENT:  Head: Normocephalic and atraumatic.  Mucous membranes dry  Eyes: Pupils are equal, round, and reactive to light.  Cardiovascular: Normal rate.   Murmur heard. Irregular rhythm  Pulmonary/Chest: He is in respiratory distress. He has no wheezes.  Crackles bilateral bases, increased respiratory rate, accessory muscle use  Abdominal: Soft. Bowel sounds are normal. There is no tenderness. There is no rebound.  Musculoskeletal: He exhibits no edema.  Neurological: He is alert and oriented to person, place, and time.  Skin: Skin is warm and dry.  Psychiatric: He has a normal mood and affect.  Nursing note and vitals reviewed.    ED Treatments / Results  DIAGNOSTIC STUDIES: Oxygen Saturation is 91% on [place], low by my interpretation.    COORDINATION OF CARE: 3:15 AM- Pt advised of plan for treatment and pt agrees.    Labs (all labs ordered are listed, but only abnormal results are displayed) Labs Reviewed  I-STAT TROPOININ, ED - Abnormal; Notable for the following:       Result Value   Troponin i,  poc 0.36 (*)    All other components within normal limits  I-STAT CG4 LACTIC ACID, ED - Abnormal; Notable for the following:    Lactic Acid, Venous 2.04 (*)    All other components within normal limits  I-STAT ARTERIAL BLOOD GAS, ED - Abnormal; Notable for the following:    pH, Arterial 7.503 (*)    pCO2 arterial 25.3 (*)    pO2, Arterial 48.0 (*)    Bicarbonate 19.9 (*)    All other components within normal limits  CULTURE, BLOOD (ROUTINE X 2)  CULTURE, BLOOD (ROUTINE X 2)  CBC WITH DIFFERENTIAL/PLATELET  COMPREHENSIVE METABOLIC PANEL  BRAIN NATRIURETIC PEPTIDE    EKG  EKG Interpretation  Date/Time:  Monday April 28 2016 03:09:43 EST Ventricular Rate:  92 PR Interval:    QRS Duration: 130 QT Interval:  463 QTC Calculation: 459 R Axis:   -81 Text Interpretation:  Sinus rhythm Multiple premature complexes, vent & supraven Borderline short PR interval LVH with IVCD, LAD and secondary repol abnrm ST elevation suggests acute pericarditis Lateral ST depressions Confirmed by Dina Rich  MD, COURTNEY ([id number]) on 05/20/2016 4:13:40 AM       Radiology Dg Chest Portable 1 View  Result Date: 05/16/2016 CLINICAL DATA:  Shortness of breath EXAM: PORTABLE CHEST 1 VIEW COMPARISON:  10/08/2015 FINDINGS: Diffuse cardiac enlargement without vascular congestion. Bilateral perihilar infiltrates with focal consolidation in the right mid and lower lung zone, new since prior study. This likely represents pneumonia. Probable small right pleural effusion. No pneumothorax. Calcified and tortuous aorta. Surgical clips in the base of the neck and mediastinum. IMPRESSION: Bilateral perihilar infiltrates with focal areas of consolidation in the right mid and lower lung zone and probable small right pleural effusion. Changes likely to represent pneumonia. Electronically Signed   By: Lucienne Capers M.D.   On: 05/07/2016 03:49    Procedures Procedures (including critical care time)  CRITICAL CARE Performed  by: Merryl Hacker   Total critical care time: 40 minutes  Critical care time was exclusive of separately billable procedures and treating other patients.  Critical care was necessary to treat or prevent imminent or life-threatening deterioration.  Critical care was time spent personally by me on the following activities: development of treatment plan with patient and/or surrogate as well as nursing, discussions with consultants, evaluation of patient's response to treatment, examination of patient, obtaining history from patient or surrogate, ordering and performing treatments and interventions,  ordering and review of laboratory studies, ordering and review of radiographic studies, pulse oximetry and re-evaluation of patient's condition.   Medications Ordered in ED Medications  cefTRIAXone (ROCEPHIN) 1 g in dextrose 5 % 50 mL IVPB (not administered)  azithromycin (ZITHROMAX) 500 mg in dextrose 5 % 250 mL IVPB (not administered)     Initial Impression / Assessment and Plan / ED Course  Merryl Hacker, MD has reviewed the triage vital signs and the nursing notes.  Pertinent labs & imaging results that were available during my care of the patient were reviewed by me and considered in my medical decision making (see chart for details).  Clinical Course     Patient presents with shortness of breath. Denies chest pain. Denies infectious symptoms. Increase respiratory rate with accessory muscle use but otherwise nontoxic-appearing. Was hypoxic requiring several liters of oxygen. He has crackles. Otherwise not systemically volume overloaded. X-ray concerning for pneumonia. EKG shows intraventricular conduction delay, frequent PACs. Troponin is elevated 0.37. Patient has non-intervenable heart disease.  Likely demand related.  Discussed with Dr. Jeannine Boga, Cardiology. He will have the daytime cardiology team followed the patient. ABG shows acute uncompensated respiratory acidosis with  hypoxia. Patient placed on a Ventimask. Will call the hospitalist for admission.  Final Clinical Impressions(s) / ED Diagnoses   Final diagnoses:  Community acquired pneumonia, unspecified laterality  Hypoxia  Elevated troponin    New Prescriptions New Prescriptions   No medications on file   I personally performed the services described in this documentation, which was scribed in my presence. The recorded information has been reviewed and is accurate.    Merryl Hacker, MD 04/30/2016 [phone]

## 2016-04-28 NOTE — H&P (Signed)
History and Physical    Isaiah Graham [id number] DOB: [birth year] DOA: 05/19/2016  PCP: Eulas Post, MD  Patient coming from: Home.  Chief Complaint: Shortness of breath.  HPI: Isaiah Graham is a 82 y.o. male with CAD, systolic heart failure last EF measured in March 2017 was 20-25%, hypertension, moderate aortic stenosis and severe aortic insufficiency was brought to the ER after patient was complaining of increasing shortness of breath. As per patient, patient has been having shortness of breath over the last 3 weeks which acutely worsened last night. Denies any chest pain productive cough fever chills nausea vomiting abdominal pain or diarrhea. Chest x-ray shows features concerning for pneumonia. Patient at this time is requiring 100% nonrebreather to maintain sats. On exam patient is not in distress. Patient is being admitted for acute respiratory failure possibly a combination of CHF and pneumonia.   ED Course: Was placed on antibiotics ceftriaxone and Zithromax for pneumonia and will be placed on BiPAP for respiratory failure.  Review of Systems: As per HPI, rest all negative.   Past Medical History:  Diagnosis Date  . Aortic aneurysm, abdominal (HCC)    repair  . Aortic stenosis 09/17/2015   a. Echo 08/2015: Moderate AS  . Arthritis   . Barrett esophagus   . CKD (chronic kidney disease) stage 4, GFR 15-29 ml/min (HCC)   . Dilated cardiomyopathy ([place]) 09/17/2015   a. Echo 08/2015: EF 20-25% w. global HK and inferolateral AK  . Esophageal cancer ([place]) 1990  . Esophageal stricture   . Food impaction of esophagus   . Gastroparesis   . Hypertension   . Hypothyroidism   . Inguinal hernia February 2013   right    Past Surgical History:  Procedure Laterality Date  . CARDIAC CATHETERIZATION N/A 10/17/2015   Procedure: Right/Left Heart Cath and Coronary Angiography;  Surgeon: Sherren Mocha, MD;  Location: [facility];  Service: Cardiovascular;   Laterality: N/A;  . COARCTATION OF AORTA REPAIR  2009  . ESOPHAGECTOMY     partial 1997  . ESOPHAGOGASTRODUODENOSCOPY  10/05/2011   Procedure: ESOPHAGOGASTRODUODENOSCOPY (EGD);  Surgeon: Ladene Artist, MD,FACG;  Location: [facility];  Service: Endoscopy;  Laterality: N/A;  . INGUINAL HERNIA REPAIR  09/01/11   bilateral  . INGUINAL HERNIA REPAIR Left 10/09/2015   Procedure: open femoral hernia repair;  Surgeon: Mickeal Skinner, MD;  Location: [facility];  Service: General;  Laterality: Left;  . SP ENDO REPAIR INFRAREN AAA  8/09   es  . TOTAL HIP ARTHROPLASTY Right 08/06/2012   Procedure: RIGHT TOTAL HIP ARTHOPLASTY WITH ACETABULAR AUTOGRAFT;  Surgeon: Gearlean Alf, MD;  Location: [facility];  Service: Orthopedics;  Laterality: Right;  RIGHT TOTAL HIP ARTHOPLASTY WITH ACETABULAR AUTOGRAFT   . UMBILICAL HERNIA REPAIR    . VAGOTOMY       reports that he quit smoking about 45 years ago. His smoking use included Cigarettes. He has a 11.00 pack-year smoking history. He has never used smokeless tobacco. He reports that he does not drink alcohol or use drugs.  Allergies  Allergen Reactions  . Morphine And Related Other (See Comments)    Intolerance - hallucinations, delusions on high doses per daughter    Family History  Problem Relation Age of Onset  . Pancreatic cancer Father   . Glaucoma Sister   . Arthritis/Rheumatoid Mother   . Aneurysm Daughter     brain    Prior to Admission medications   Medication Sig Start Date  End Date Taking? Authorizing Provider  aspirin 81 MG tablet Take 81 mg by mouth daily.   Yes Historical Provider, MD  docusate sodium (COLACE) 100 MG capsule Take 200 mg by mouth daily.   Yes Historical Provider, MD  hydrochlorothiazide (MICROZIDE) 12.5 MG capsule Take 1 capsule (12.5 mg total) by mouth daily. 08/29/15  Yes Eulas Post, MD  levothyroxine (SYNTHROID, LEVOTHROID) 88 MCG tablet Take 1 tablet (88 mcg total) by mouth daily. 09/07/15  Yes Eulas Post, MD  mirtazapine (REMERON) 15 MG tablet TAKE 1 TABLET AT BEDTIME 03/17/16  Yes Eulas Post, MD  NIFEdipine (PROCARDIA-XL/ADALAT-CC/NIFEDICAL-XL) 30 MG 24 hr tablet Take 1 tablet (30 mg total) by mouth daily. 02/11/16  Yes Liliane Shi, PA-C  Omega-3 Fatty Acids (FISH OIL PO) Take 1 capsule by mouth daily.   Yes Historical Provider, MD  omeprazole (PRILOSEC) 20 MG capsule TAKE 1 CAPSULE DAILY 04/22/16  Yes Eulas Post, MD  Probiotic Product (PROBIOTIC COLON SUPPORT) CAPS Take 1 capsule by mouth every evening.   Yes Historical Provider, MD  tamsulosin (FLOMAX) 0.4 MG CAPS capsule TAKE 1 CAPSULE (0.4 MG TOTAL) BY MOUTH DAILY AFTER SUPPER. 08/29/15  Yes Eulas Post, MD  traMADol (ULTRAM) 50 MG tablet Take 1-2 tablets (50-100 mg total) by mouth every 6 (six) hours as needed for severe pain. Patient not taking: Reported on 04/25/2016 10/12/15   Cherene Altes, MD    Physical Exam: Vitals:   05/22/2016 0530 04/23/2016 0600 05/14/2016 0630 04/27/2016 0632  BP: 109/57 91/61 97/59  97/59  Pulse: 66 (!) 50 (!) 42 72  Resp: 22  17   Temp:      TempSrc:      SpO2: 100% 100% 100% 99%  Weight:      Height:          Constitutional: Poorly built and nourished. Vitals:   05/10/2016 0530 05/02/2016 0600 04/30/2016 0630 05/06/2016 0632  BP: 109/57 91/61 97/59  97/59  Pulse: 66 (!) 50 (!) 42 72  Resp: 22  17   Temp:      TempSrc:      SpO2: 100% 100% 100% 99%  Weight:      Height:       Eyes: Anicteric no pallor. ENMT: No discharge from the ears eyes nose and mouth. Neck: JVD elevation or mass felt. No neck rigidity. Respiratory: No rhonchi or crepitations. Cardiovascular: S1 and S2 heard. Systolic murmur heard. Abdomen: Soft nontender bowel sounds present. No guarding or rigidity. Musculoskeletal: No edema. No joint effusion. Skin: No rash. Skin appears warm. Neurologic: Alert awake oriented to time place and person. Moves all extremities. Psychiatric: Appears normal. Normal  affect.   Labs on Admission: I have personally reviewed following labs and imaging studies  CBC:  Recent Labs Lab 05/05/2016 0338  WBC 5.4  NEUTROABS 4.3  HGB 13.6  HCT 39.8  MCV 90.9  PLT [id number]   Basic Metabolic Panel:  Recent Labs Lab 05/20/2016 0338  NA 142  K 4.4  CL 108  CO2 20*  GLUCOSE 122*  BUN 98*  CREATININE 2.27*  CALCIUM 10.9*   GFR: Estimated Creatinine Clearance: 17.4 mL/min (by C-G formula based on SCr of 2.27 mg/dL (H)). Liver Function Tests:  Recent Labs Lab 05/03/2016 0338  AST 40  ALT 35  ALKPHOS 85  BILITOT 0.9  PROT 7.4  ALBUMIN 3.9   No results for input(s): LIPASE, AMYLASE in the last 168 hours. No results for input(s): AMMONIA in  the last 168 hours. Coagulation Profile: No results for input(s): INR, PROTIME in the last 168 hours. Cardiac Enzymes: No results for input(s): CKTOTAL, CKMB, CKMBINDEX, TROPONINI in the last 168 hours. BNP (last 3 results) No results for input(s): PROBNP in the last 8760 hours. HbA1C: No results for input(s): HGBA1C in the last 72 hours. CBG: No results for input(s): GLUCAP in the last 168 hours. Lipid Profile: No results for input(s): CHOL, HDL, LDLCALC, TRIG, CHOLHDL, LDLDIRECT in the last 72 hours. Thyroid Function Tests: No results for input(s): TSH, T4TOTAL, FREET4, T3FREE, THYROIDAB in the last 72 hours. Anemia Panel: No results for input(s): VITAMINB12, FOLATE, FERRITIN, TIBC, IRON, RETICCTPCT in the last 72 hours. Urine analysis:    Component Value Date/Time   COLORURINE AMBER (A) 10/09/2015 0715   APPEARANCEUR CLEAR 10/09/2015 0715   LABSPEC 1.022 10/09/2015 0715   PHURINE 5.0 10/09/2015 0715   GLUCOSEU NEGATIVE 10/09/2015 0715   HGBUR NEGATIVE 10/09/2015 0715   BILIRUBINUR SMALL (A) 10/09/2015 0715   KETONESUR 15 (A) 10/09/2015 0715   PROTEINUR 30 (A) 10/09/2015 0715   UROBILINOGEN 1.0 08/14/2012 1421   NITRITE NEGATIVE 10/09/2015 0715   LEUKOCYTESUR NEGATIVE 10/09/2015 0715   Sepsis  Labs: @LABRCNTIP (procalcitonin:4,lacticidven:4) )No results found for this or any previous visit (from the past 240 hour(s)).   Radiological Exams on Admission: Dg Chest Portable 1 View  Result Date: 04/30/2016 CLINICAL DATA:  Shortness of breath EXAM: PORTABLE CHEST 1 VIEW COMPARISON:  10/08/2015 FINDINGS: Diffuse cardiac enlargement without vascular congestion. Bilateral perihilar infiltrates with focal consolidation in the right mid and lower lung zone, new since prior study. This likely represents pneumonia. Probable small right pleural effusion. No pneumothorax. Calcified and tortuous aorta. Surgical clips in the base of the neck and mediastinum. IMPRESSION: Bilateral perihilar infiltrates with focal areas of consolidation in the right mid and lower lung zone and probable small right pleural effusion. Changes likely to represent pneumonia. Electronically Signed   By: Lucienne Capers M.D.   On: 05/11/2016 03:49    EKG: Independently reviewed. Normal sinus rhythm with LVH and PVCs.  Assessment/Plan Principal Problem:   Acute and chronic respiratory failure with hypoxia (HCC) Active Problems:   Hypothyroidism   Essential hypertension   CKD (chronic kidney disease) stage 4, GFR 15-29 ml/min (HCC)   Aortic stenosis   Aortic insufficiency   Community acquired pneumonia   Acute respiratory failure with hypoxia ([place])    1. Acute respiratory failure with hypoxia - probably a combination of CHF and pneumonia. Patient's blood pressures in the low normal so cannot give Lasix at this time. Will place patient on BiPAP and closely monitor. I have consulted cardiology. For possible pneumonia patient is being placed on ceftriaxone and Zithromax. Check sputum cultures urine for Legionella and strep antigen. Cycle cardiac markers. 2. Hypertension - may have to hold antihypertensive if  the patient's blood pressure remains in the low normals. Closely observe blood pressure trends. 3. Chronic kidney  disease stage IV - creatinine mildly worsened. Closely observe. 4. History of aortic stenosis moderate with severe aortic insufficiency - probably contribute to #1. Cardiology to see patient. 5. Hypothyroidism on Synthroid. 6. CAD - denies any chest pain but patient does have shortness of breath. We'll cycle cardiac markers. 7. History of esophageal cancer status post surgery.   DVT prophylaxis: Lovenox. Code Status: Full code.  Family Communication: Patient's daughter.  Disposition Plan: Home.  Consults called: Cardiology.  Admission status: Inpatient. Likely stay 2-3 days.    Marcus Groll N.  MD [facility] Pager [phone].  If 7PM-7AM, please contact night-coverage [email or web address] Password [email or web address]  05/14/2016, 7:14 AM

## 2016-04-28 NOTE — ED Notes (Signed)
ED Provider at bedside. Hal Hope

## 2016-04-28 NOTE — ED Triage Notes (Signed)
Pt presents from home with [facility] for SOB and hypoxia that began tonight; EMS reports O2 sats 76% on room air on their arrival and also has periods of tachypnea; denies fever, cough; worse in the evening; afib on the monitor with bigeminy

## 2016-04-28 NOTE — Patient Outreach (Signed)
[facility]) Care Management  05/05/2016  Isaiah Graham 08/04/1929 [id number]   Patient admitted to hospital. Will notify hospital liaison of admit. Will send discipline closure letter to physician.   Jone Baseman, RN, MSN [place] [phone]

## 2016-04-28 NOTE — Consult Note (Signed)
   [facility] CM Inpatient Consult   05/08/2016  RISHI GEMMELL [birth year] [id number]  Patient is currently active with [facility] for chronic disease management services for HF program.  Patient has been engaged by a [facility].  Made aware of the patient's admission.  Chart review reveals the patient is an 82 y.o.malewith CAD, systolic heart failure (EF 20-25% March 2017), hypertension, moderate aortic stenosis, and severe aortic insufficiency who was brought to the ER complaining of increasing shortness of breath w/o chest pain over 3 weeks.  Patient is currently in stepdown level of care.  Will follow the patient for progress and disposition needs for community follow up as appropriate.  Of note, [facility] Care Management services does not replace or interfere with any services that are needed or arranged by inpatient case management or social work.  For additional questions or referrals please contact:  Natividad Brood, RN BSN [facility] Liaison  [phone] business mobile phone Toll free office [phone]

## 2016-04-28 NOTE — Progress Notes (Signed)
Removed bipap from patient for medication administration and to offer a drink. Patient only tolerated approximately 10 minutes before respiratory rate increased to 30-35, effort increased and O2 sat's dropped to 90-92%. Replaced bipap and patient resting quietly.

## 2016-04-28 NOTE — ED Notes (Signed)
Attempted report x 1; name and call back number provided 

## 2016-04-28 NOTE — ED Notes (Signed)
Attempted report x 1- will not accept patient at this time for possibility of getting BiPAP for resp problem

## 2016-04-28 NOTE — Consult Note (Signed)
Patient ID: Isaiah Graham MRN: [id number], DOB/AGE: [birth year]   Admit date: 05/09/2016   Reason for Consult: Severe Aortic Insufficiency, Chronic Systolic CHF  Requesting MD: Dr. Hal Hope, Internal Medicine    Primary Physician: Eulas Post, MD Primary Cardiologist: Dr. Burt Knack   Pt. Profile:  82 y/o male with severe aortic insufficiency, severe cardiomyopathy w/ EF of 20-25%, moderate aortic stenosis, severe multivessel coronary artery disease, deemed not a surgical candidate by Dr. Servando Snare, CKD, HTN, general failure to thrive with protein calorie malnutrition and very poor functional status, admitted for acute hypoxic respiratory failure in the setting of PNA and chronic systolic CHF.   Problem List  Past Medical History:  Diagnosis Date  . Aortic aneurysm, abdominal (HCC)    repair  . Aortic stenosis 09/17/2015   a. Echo 08/2015: Moderate AS  . Arthritis   . Barrett esophagus   . CKD (chronic kidney disease) stage 4, GFR 15-29 ml/min (HCC)   . Dilated cardiomyopathy ([place]) 09/17/2015   a. Echo 08/2015: EF 20-25% w. global HK and inferolateral AK  . Esophageal cancer ([place]) 1990  . Esophageal stricture   . Food impaction of esophagus   . Gastroparesis   . Hypertension   . Hypothyroidism   . Inguinal hernia February 2013   right    Past Surgical History:  Procedure Laterality Date  . CARDIAC CATHETERIZATION N/A 10/17/2015   Procedure: Right/Left Heart Cath and Coronary Angiography;  Surgeon: Sherren Mocha, MD;  Location: [place] [facility];  Service: Cardiovascular;  Laterality: N/A;  . COARCTATION OF AORTA REPAIR  2009  . ESOPHAGECTOMY     partial 1997  . ESOPHAGOGASTRODUODENOSCOPY  10/05/2011   Procedure: ESOPHAGOGASTRODUODENOSCOPY (EGD);  Surgeon: Ladene Artist, MD,FACG;  Location: [facility] ENDOS[facility];  Service: Endoscopy;  Laterality: N/A;  . INGUINAL HERNIA REPAIR  09/01/11   bilateral  . INGUINAL HERNIA REPAIR Left 10/09/2015   Procedure: open femoral  hernia repair;  Surgeon: Mickeal Skinner, MD;  Location: [facility];  Service: General;  Laterality: Left;  . SP ENDO REPAIR INFRAREN AAA  8/09   es  . TOTAL HIP ARTHROPLASTY Right 08/06/2012   Procedure: RIGHT TOTAL HIP ARTHOPLASTY WITH ACETABULAR AUTOGRAFT;  Surgeon: Gearlean Alf, MD;  Location: [facility];  Service: Orthopedics;  Laterality: Right;  RIGHT TOTAL HIP ARTHOPLASTY WITH ACETABULAR AUTOGRAFT   . UMBILICAL HERNIA REPAIR    . VAGOTOMY       Allergies  Allergies  Allergen Reactions  . Morphine And Related Other (See Comments)    Intolerance - hallucinations, delusions on high doses per daughter    HPI Mr. Isaiah Graham is a 82 y/o male, followed by Dr. Burt Knack, who has been admitted for acute hypoxic respiratory failure, in the setting of PNA and chronic systolic CHF.   He has significant cardiac history. He was recently diagnosed with severe aortic insufficiency, severe cardiomyopathy, and moderate aortic stenosis. Echo 09/13/15 showed an EF of 20-25%. He underwent right and left heart catheterization also demonstrating multivessel coronary artery disease. 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 He also has CKD. He has had general failure to thrive with protein calorie malnutrition and very poor functional status over the last year. He was seen by Dr. Servando Snare in cardiac surgical consultation who felt that his chances of recovering from major cardiac surgery would be very poor. He was seen by Dr. Burt Knack, in  clinic, on 11/01/15, and agreed that he was not a surgical candidate. Palliative care was recommended.   He presented to the [facility] ED on 04/29/2016 with CC of worsening dyspnea. Progressive over the last 3 weeks. Patient was hypoxic on arrival and he required 100% nonrebreather to maintain sats. Chest x-ray shows features concerning for pneumonia. He was admitted by IM and placed on antibiotics.  BNP was also abnormal at >4,500. Initial POC troponin was abnormal at 0.36. Actual lab troponin 0.59. His Scr is 2.31. EKG shows intraventricular conduction delay, frequent PACs.   Pt is currently on BiPAP. He denies CP.    Home Medications  Prior to Admission medications   Medication Sig Start Date End Date Taking? Authorizing Provider  aspirin 81 MG tablet Take 81 mg by mouth daily.   Yes Historical Provider, MD  docusate sodium (COLACE) 100 MG capsule Take 200 mg by mouth daily.   Yes Historical Provider, MD  hydrochlorothiazide (MICROZIDE) 12.5 MG capsule Take 1 capsule (12.5 mg total) by mouth daily. 08/29/15  Yes Eulas Post, MD  levothyroxine (SYNTHROID, LEVOTHROID) 88 MCG tablet Take 1 tablet (88 mcg total) by mouth daily. 09/07/15  Yes Eulas Post, MD  mirtazapine (REMERON) 15 MG tablet TAKE 1 TABLET AT BEDTIME 03/17/16  Yes Eulas Post, MD  NIFEdipine (PROCARDIA-XL/ADALAT-CC/NIFEDICAL-XL) 30 MG 24 hr tablet Take 1 tablet (30 mg total) by mouth daily. 02/11/16  Yes Liliane Shi, PA-C  Omega-3 Fatty Acids (FISH OIL PO) Take 1 capsule by mouth daily.   Yes Historical Provider, MD  omeprazole (PRILOSEC) 20 MG capsule TAKE 1 CAPSULE DAILY 04/22/16  Yes Eulas Post, MD  Probiotic Product (PROBIOTIC COLON SUPPORT) CAPS Take 1 capsule by mouth every evening.   Yes Historical Provider, MD  tamsulosin (FLOMAX) 0.4 MG CAPS capsule TAKE 1 CAPSULE (0.4 MG TOTAL) BY MOUTH DAILY AFTER SUPPER. 08/29/15  Yes Eulas Post, MD  traMADol (ULTRAM) 50 MG tablet Take 1-2 tablets (50-100 mg total) by mouth every 6 (six) hours as needed for severe pain. Patient not taking: Reported on 05/20/2016 10/12/15   Cherene Altes, MD   Hospital Medicines . aspirin  81 mg Oral Daily  . [START ON 04/29/2016] azithromycin  500 mg Intravenous Q24H  . [START ON 04/29/2016] cefTRIAXone (ROCEPHIN)  IV  1 g Intravenous Q24H  . docusate sodium  200 mg Oral Daily  . enoxaparin (LOVENOX) injection  30  mg Subcutaneous Q24H  . hydrochlorothiazide  12.5 mg Oral Daily  . levothyroxine  88 mcg Oral QAC breakfast  . mirtazapine  15 mg Oral QHS  . NIFEdipine  30 mg Oral Daily  . omega-3 acid ethyl esters  2 g Oral Daily  . pantoprazole  40 mg Oral Daily  . tamsulosin  0.4 mg Oral QPC supper    Family History  Family History  Problem Relation Age of Onset  . Pancreatic cancer Father   . Glaucoma Sister   . Arthritis/Rheumatoid Mother   . Aneurysm Daughter     brain    Social History  Social History   Social History  . Marital status: Married    Spouse name: N/A  . Number of children: 5  . Years of education: N/A   Occupational History  . retired    Social History Main Topics  . Smoking status: Former Smoker    Packs/day: 0.50    Years: 22.00    Types: Cigarettes    Quit date: 12/04/1970  .  Smokeless tobacco: Never Used  . Alcohol use No  . Drug use: No  . Sexual activity: Not on file   Other Topics Concern  . Not on file   Social History Narrative  . No narrative on file     Review of Systems General:  No chills, fever, night sweats or weight changes.  Cardiovascular:  No chest pain, dyspnea on exertion, edema, orthopnea, palpitations, paroxysmal nocturnal dyspnea. Dermatological: No rash, lesions/masses Respiratory: No cough, dyspnea Urologic: No hematuria, dysuria Abdominal:   No nausea, vomiting, diarrhea, bright red blood per rectum, melena, or hematemesis Neurologic:  No visual changes, wkns, changes in mental status. All other systems reviewed and are otherwise negative except as noted above.  Physical Exam  Blood pressure (!) 118/58, pulse (!) 47, temperature 97.5 F (36.4 C), temperature source Oral, resp. rate (!) 29, height 5\' 7"  (1.702 m), weight 112 lb (50.8 kg), SpO2 99 %.  General: Pleasant, NAD, elderly and frail, on BiPAP.  Psych: Normal affect. Neuro: Alert and oriented X 3. Moves all extremities spontaneously. HEENT: Normal  Neck:  Supple without bruits or JVD. Lungs:  Resp regular and unlabored, CTA. Heart: irregular rhythm, regular rate, 2/6 Murmur along LSB and apex.  Abdomen: Soft, non-tender, non-distended, BS + x 4.  Extremities: No clubbing, cyanosis or edema. DP/PT/Radials 2+ and equal bilaterally.  Labs  Troponin [facility] of Care Test)  Recent Labs  05/19/2016 0346  TROPIPOC 0.36*    Recent Labs  05/21/2016 0754  TROPONINI 0.59*   Lab Results  Component Value Date   WBC 6.2 04/25/2016   HGB 12.5 (L) 05/03/2016   HCT 37.2 (L) 05/14/2016   MCV 90.5 05/18/2016   PLT 181 05/10/2016    Recent Labs Lab 04/23/2016 0754  NA 140  K 3.9  CL 107  CO2 20*  BUN 100*  CREATININE 2.31*  CALCIUM 10.4*  PROT 6.7  BILITOT 0.7  ALKPHOS 77  ALT 29  AST 30  GLUCOSE 135*   Lab Results  Component Value Date   CHOL 196 01/31/2013   HDL 46.20 01/31/2013   LDLCALC 131 (H) 01/31/2013   TRIG 94.0 01/31/2013   No results found for: DDIMER   Radiology/Studies  Dg Chest Portable 1 View  Result Date: 05/06/2016 CLINICAL DATA:  Shortness of breath EXAM: PORTABLE CHEST 1 VIEW COMPARISON:  10/08/2015 FINDINGS: Diffuse cardiac enlargement without vascular congestion. Bilateral perihilar infiltrates with focal consolidation in the right mid and lower lung zone, new since prior study. This likely represents pneumonia. Probable small right pleural effusion. No pneumothorax. Calcified and tortuous aorta. Surgical clips in the base of the neck and mediastinum. IMPRESSION: Bilateral perihilar infiltrates with focal areas of consolidation in the right mid and lower lung zone and probable small right pleural effusion. Changes likely to represent pneumonia. Electronically Signed   By: Lucienne Capers M.D.   On: 05/13/2016 03:49    ECG  intraventricular conduction delay, frequent PACs.    ASSESSMENT AND PLAN  1. PNA: CXR showing bilateral perihilar infiltrates with focal areas of consolidation in the right mid and  lower lung zone and probable small right pleural effusion. Changes likely to represent pneumonia. On antibiotics. Management per IM.   2. Chronic Systolic CHF: EF 0000000 on prior echo 08/2015. BNP this admit >4,500. He does not appear grossly volume overloaded based on exam. No BB due to bradycardia - no ACEi/ARB due to CKD. Monitor volume status.   3. Severe Aortic Insufficiency: Evaluated by Dr. Servando Snare  and deemed not a surgical candidate given general failure to thrive with protein calorie malnutrition and very poor functional status. He likely has disease progression, further contributing to his dyspnea. Continue plans for palliative care.   4. Moderate Aortic Stenosis: per above, not a surgical candidate. Continue plans for palliative care.   5. 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 He denies CP. Medical options limited. No BB given bradycardia and no ACE/ARB given CKD.   6. Elevated Troponin: POC troponin 0.36. Actual lab troponin 0.59. This is also in the setting of CKD with SCr at 2.31, PNA and chronic systolic HF, severe AI and moderate AS. Not a candidate for cath.   7. CKD: Scr at 2.31. Per IM.   8. Failure to Thrive: Per IM.   Signed, Lyda Jester, PA-C 05/20/2016, 11:28 AM  Patient seen, examined. Available data reviewed. Agree with findings, assessment, and plan as outlined by Ellen Henri, PA-C. The patient is on BiPap and no family at bedside so history is extremely limited by the patient's respiratory failure.   On exam the patient is an elderly, cachectic male with BiPAP on. JVP is mildly elevated. There are no carotid bruits. Lung fields are clear. Heart sounds are distant with regular rate and rhythm with a 2/6 systolic murmur at the left lower sternal border and a 2/6 diastolic decrescendo murmur at the left lower sternal border. Abdomen is  soft, thin, and nontender. There are no palpable masses. There is no pretibial edema. Skin is warm and dry without rash.  The patient is well known to me. I last saw him in May 2017, but he has been followed closely in our practice. He has advanced heart failure related to severe underlying aortic valve urgent dictation and ischemic heart disease. He presents this hospital admission with acute on chronic respiratory failure. I have reviewed laboratory data, radiographic data, and clinical notes. The patient has undergone previous evaluation for cardiac surgery, but he is clearly not a candidate in the setting of his advanced age, severe protein calorie malnutrition, and poor functional status.  I think the patient's clinical scenario is most consistent with acute on chronic systolic heart failure. While his chest x-ray has some features of right lower lobe pneumonia, his overall clinical scenario with markedly elevated BNP and known severe cardiomyopathy suggest progressive problems related to his cardiac disease. There are no treatment options for this patient severe aortic insufficiency. Medical therapy is limited by his low blood pressure and acute kidney injury. I think it is best to avoid aggressive IV diuresis in the setting of his acute kidney injury. Continue with supportive care using IV antibiotics and BiPAP. Appreciate the hospitalist team care of this patient. Will follow with you. I will continue to have discussions with the patient and family about a palliative approach to his care.   Sherren Mocha, M.D. 05/06/2016 1:03 PM

## 2016-04-29 ENCOUNTER — Inpatient Hospital Stay (HOSPITAL_COMMUNITY): Payer: Medicare Other

## 2016-04-29 DIAGNOSIS — N184 Chronic kidney disease, stage 4 (severe): Secondary | ICD-10-CM

## 2016-04-29 DIAGNOSIS — I5023 Acute on chronic systolic (congestive) heart failure: Secondary | ICD-10-CM

## 2016-04-29 DIAGNOSIS — R748 Abnormal levels of other serum enzymes: Secondary | ICD-10-CM

## 2016-04-29 DIAGNOSIS — J9601 Acute respiratory failure with hypoxia: Secondary | ICD-10-CM

## 2016-04-29 DIAGNOSIS — R778 Other specified abnormalities of plasma proteins: Secondary | ICD-10-CM

## 2016-04-29 DIAGNOSIS — J189 Pneumonia, unspecified organism: Secondary | ICD-10-CM

## 2016-04-29 DIAGNOSIS — R7989 Other specified abnormal findings of blood chemistry: Secondary | ICD-10-CM

## 2016-04-29 DIAGNOSIS — E038 Other specified hypothyroidism: Secondary | ICD-10-CM

## 2016-04-29 DIAGNOSIS — I10 Essential (primary) hypertension: Secondary | ICD-10-CM

## 2016-04-29 LAB — GLUCOSE, CAPILLARY: GLUCOSE-CAPILLARY: 133 mg/dL — AB (ref 65–99)

## 2016-04-29 LAB — BASIC METABOLIC PANEL
ANION GAP: 13 (ref 5–15)
BUN: 97 mg/dL — ABNORMAL HIGH (ref 6–20)
CHLORIDE: 109 mmol/L (ref 101–111)
CO2: 21 mmol/L — ABNORMAL LOW (ref 22–32)
Calcium: 10.6 mg/dL — ABNORMAL HIGH (ref 8.9–10.3)
Creatinine, Ser: 2.25 mg/dL — ABNORMAL HIGH (ref 0.61–1.24)
GFR calc non Af Amer: 25 mL/min — ABNORMAL LOW (ref >60)
GFR, EST AFRICAN AMERICAN: 29 mL/min — AB (ref >60)
Glucose, Bld: 126 mg/dL — ABNORMAL HIGH (ref 65–99)
POTASSIUM: 4.4 mmol/L (ref 3.5–5.1)
SODIUM: 143 mmol/L (ref 135–145)

## 2016-04-29 LAB — CBC
HEMATOCRIT: 37.1 % — AB (ref 39.0–52.0)
Hemoglobin: 12.2 g/dL — ABNORMAL LOW (ref 13.0–17.0)
MCH: 30.3 pg (ref 26.0–34.0)
MCHC: 32.9 g/dL (ref 30.0–36.0)
MCV: 92.1 fL (ref 78.0–100.0)
Platelets: 188 10*3/uL (ref 150–400)
RBC: 4.03 MIL/uL — AB (ref 4.22–5.81)
RDW: 14.1 % (ref 11.5–15.5)
WBC: 6.8 10*3/uL (ref 4.0–10.5)

## 2016-04-29 LAB — LEGIONELLA PNEUMOPHILA SEROGP 1 UR AG: L. pneumophila Serogp 1 Ur Ag: NEGATIVE

## 2016-04-29 MED ORDER — LEVALBUTEROL HCL 1.25 MG/0.5ML IN NEBU
1.2500 mg | INHALATION_SOLUTION | Freq: Four times a day (QID) | RESPIRATORY_TRACT | Status: DC
  Administered 2016-04-29 – 2016-04-30 (×3): 1.25 mg via RESPIRATORY_TRACT
  Filled 2016-04-29 (×4): qty 0.5

## 2016-04-29 MED ORDER — METHYLPREDNISOLONE SODIUM SUCC 125 MG IJ SOLR
60.0000 mg | INTRAMUSCULAR | Status: DC
  Administered 2016-04-29: 60 mg via INTRAVENOUS
  Filled 2016-04-29: qty 2

## 2016-04-29 MED ORDER — WHITE PETROLATUM GEL
Status: AC
  Administered 2016-04-29: 23:00:00
  Filled 2016-04-29: qty 1

## 2016-04-29 MED ORDER — LORAZEPAM 1 MG PO TABS
1.0000 mg | ORAL_TABLET | Freq: Once | ORAL | Status: DC
  Filled 2016-04-29: qty 1

## 2016-04-29 NOTE — Progress Notes (Signed)
PROGRESS NOTE    Isaiah Graham  [id number] DOB: 02-05-1930 DOA: 05/04/2016 PCP: Eulas Post, MD   Brief Narrative:  82 y.o. WM PMHx CAD, Chronic Systolic CHF last EF measured in March 2017 was 20-25%/Dilated cardiomyopathy, HTN, Moderate AS and severe AI, Hypothyroidism,Esophageal cancer, Esophageal stricture  Was brought to the ER after patient was complaining of increasing shortness of breath. As per patient, patient has been having shortness of breath over the last 3 weeks which acutely worsened last night. Denies any chest pain productive cough fever chills nausea vomiting abdominal pain or diarrhea. Chest x-ray shows features concerning for pneumonia. Patient at this time is requiring 100% nonrebreather to maintain sats. On exam patient is not in distress. Patient is being admitted for acute respiratory failure possibly a combination of CHF and pneumonia.     Subjective: 11/7  A/O 4, pleasant cooperative not sure patient understands completely seriousness of his illness.   Assessment & Plan:   Principal Problem:   Acute and chronic respiratory failure with hypoxia (HCC) Active Problems:   Hypothyroidism   Essential hypertension   CKD (chronic kidney disease) stage 4, GFR 15-29 ml/min (HCC)   Aortic stenosis   Aortic insufficiency   Community acquired pneumonia   Acute respiratory failure with hypoxia ([place])   Acute hypoxic respiratory failure with hypoxia/CAP -probably a combination of CHF and pneumonia, although not totally convinced patient has pneumonia given his PCXR from 11/7 -Given patient's poor likely outcome will go ahead and treat for CAP: Unlikely to make a difference -Xopenex QID -Flutter valve -Solu-Medrol 60 mg 3 doses -Consult for Palliative Care placed. Discussed CODE STATUS and goals of care given poor outcome  Chronic systolic CHF( baseline wgt ~52kg)  -EF 20-25%. With 80% stenosis, LAD,100% stenosis RCA, and 50% stenosis circumflex  from cardiac catheterization 10/17/2015 -Strict in and out since admission -741ml -Daily weight  Filed Weights   05/13/2016 0306 04/29/16 0343  Weight: 50.8 kg (112 lb) 50.3 kg (110 lb 14.3 oz)  -Nifedipine 30 mg daily per cardiology  CAD -See CHF  Hypertension -See CHF   Chronic kidney disease stage IV(baseline Cr ~1.58 to 1.68) Lab Results  Component Value Date   CREATININE 2.25 (H) 04/29/2016   CREATININE 2.31 (H) 05/12/2016   CREATININE 2.27 (H) 05/14/2016   History of aortic stenosismoderate with severe aortic insufficiency   Hypothyroidism -Synthroid 88 g daily -TSH pending   History of esophageal cancer -S/P surgery    DVT prophylaxis: Lovenox Code Status: Full Family Communication: None Disposition Plan: Await palliative care   Consultants:  Cardiology  Procedures/Significant Events:  None  Cultures 11/6 blood right AC/hand NGTD 11/6 MRSA by PCR negative 11/7 respiratory virus panel pending   Antimicrobials: Azithromycin 11/6>> Ceftriaxone 11/6>>    Devices     LINES / TUBES:      Continuous Infusions:   Objective: Vitals:   04/29/16 0100 04/29/16 0343 04/29/16 0400 04/29/16 0700  BP: (!) 132/55 (!) 145/63 (!) 145/62 (!) 142/77  Pulse: 74 69 70 (!) 116  Resp: (!) 22 (!) 25 (!) 31 (!) 35  Temp:  98 F (36.7 C)  97.2 F (36.2 C)  TempSrc:  Axillary  Axillary  SpO2: 97% 98% 99% 92%  Weight:  50.3 kg (110 lb 14.3 oz)    Height:        Intake/Output Summary (Last 24 hours) at 04/29/16 0818 Last data filed at 04/29/16 0700  Gross per 24 hour  Intake  50 ml  Output              650 ml  Net             -600 ml   Filed Weights   04/27/2016 0306 04/29/16 0343  Weight: 50.8 kg (112 lb) 50.3 kg (110 lb 14.3 oz)    Examination:  General: A/O 4, pleasant cooperative, cachectic, positive acute respiratory distress Eyes: negative scleral hemorrhage, negative anisocoria, negative icterus ENT: Negative Runny  nose, negative gingival bleeding, Neck:  Negative scars, masses, torticollis, lymphadenopathy, JVD Lungs: diffuse poor air movement, negative wheezes or crackles Cardiovascular: Regular rate and rhythm without murmur gallop or rub normal S1 and S2 Abdomen: negative abdominal pain, nondistended, positive soft, bowel sounds, no rebound, no ascites, no appreciable mass Extremities: No significant cyanosis, clubbing, or edema bilateral lower extremities Skin: Negative rashes, lesions, ulcers Psychiatric:  Negative depression, negative anxiety, negative fatigue, negative mania  Central nervous system:  Cranial nerves II through XII intact, tongue/uvula midline, all extremities muscle strength 5/5, sensation intact throughout,  negative dysarthria, negative expressive aphasia, negative receptive aphasia.  .     Data Reviewed: Care during the described time interval was provided by me .  I have reviewed this patient's available data, including medical history, events of note, physical examination, and all test results as part of my evaluation. I have personally reviewed and interpreted all radiology studies.  CBC:  Recent Labs Lab 05/15/2016 0338 04/27/2016 0754 04/29/16 0328  WBC 5.4 6.2 6.8  NEUTROABS 4.3 4.7  --   HGB 13.6 12.5* 12.2*  HCT 39.8 37.2* 37.1*  MCV 90.9 90.5 92.1  PLT 173 181 0000000   Basic Metabolic Panel:  Recent Labs Lab 05/19/2016 0338 04/29/2016 0754 04/29/16 0328  NA 142 140 143  K 4.4 3.9 4.4  CL 108 107 109  CO2 20* 20* 21*  GLUCOSE 122* 135* 126*  BUN 98* 100* 97*  CREATININE 2.27* 2.31* 2.25*  CALCIUM 10.9* 10.4* 10.6*   GFR: Estimated Creatinine Clearance: 17.4 mL/min (by C-G formula based on SCr of 2.25 mg/dL (H)). Liver Function Tests:  Recent Labs Lab 04/26/2016 0338 05/08/2016 0754  AST 40 30  ALT 35 29  ALKPHOS 85 77  BILITOT 0.9 0.7  PROT 7.4 6.7  ALBUMIN 3.9 3.4*   No results for input(s): LIPASE, AMYLASE in the last 168 hours. No results for  input(s): AMMONIA in the last 168 hours. Coagulation Profile: No results for input(s): INR, PROTIME in the last 168 hours. Cardiac Enzymes:  Recent Labs Lab 05/05/2016 0754 05/17/2016 1540 05/17/2016 1909  TROPONINI 0.59* 0.98* 0.92*   BNP (last 3 results) No results for input(s): PROBNP in the last 8760 hours. HbA1C: No results for input(s): HGBA1C in the last 72 hours. CBG: No results for input(s): GLUCAP in the last 168 hours. Lipid Profile: No results for input(s): CHOL, HDL, LDLCALC, TRIG, CHOLHDL, LDLDIRECT in the last 72 hours. Thyroid Function Tests:  Recent Labs  04/23/2016 0754  TSH 2.804   Anemia Panel: No results for input(s): VITAMINB12, FOLATE, FERRITIN, TIBC, IRON, RETICCTPCT in the last 72 hours. Urine analysis:    Component Value Date/Time   COLORURINE AMBER (A) 10/09/2015 0715   APPEARANCEUR CLEAR 10/09/2015 0715   LABSPEC 1.022 10/09/2015 0715   PHURINE 5.0 10/09/2015 0715   GLUCOSEU NEGATIVE 10/09/2015 0715   HGBUR NEGATIVE 10/09/2015 0715   BILIRUBINUR SMALL (A) 10/09/2015 0715   KETONESUR 15 (A) 10/09/2015 0715   PROTEINUR 30 (A) 10/09/2015  0715   UROBILINOGEN 1.0 08/14/2012 1421   NITRITE NEGATIVE 10/09/2015 0715   LEUKOCYTESUR NEGATIVE 10/09/2015 0715   Sepsis Labs: @LABRCNTIP (procalcitonin:4,lacticidven:4)  ) Recent Results (from the past 240 hour(s))  MRSA PCR Screening     Status: None   Collection Time: 05/02/2016  7:54 AM  Result Value Ref Range Status   MRSA by PCR NEGATIVE NEGATIVE Final    Comment:        The GeneXpert MRSA Assay (FDA approved for NASAL specimens only), is one component of a comprehensive MRSA colonization surveillance program. It is not intended to diagnose MRSA infection nor to guide or monitor treatment for MRSA infections.          Radiology Studies: Dg Chest Portable 1 View  Result Date: 04/29/2016 CLINICAL DATA:  Shortness of breath EXAM: PORTABLE CHEST 1 VIEW COMPARISON:  10/08/2015 FINDINGS:  Diffuse cardiac enlargement without vascular congestion. Bilateral perihilar infiltrates with focal consolidation in the right mid and lower lung zone, new since prior study. This likely represents pneumonia. Probable small right pleural effusion. No pneumothorax. Calcified and tortuous aorta. Surgical clips in the base of the neck and mediastinum. IMPRESSION: Bilateral perihilar infiltrates with focal areas of consolidation in the right mid and lower lung zone and probable small right pleural effusion. Changes likely to represent pneumonia. Electronically Signed   By: Lucienne Capers M.D.   On: 05/10/2016 03:49        Scheduled Meds: . aspirin  81 mg Oral Daily  . azithromycin  500 mg Intravenous Q24H  . cefTRIAXone (ROCEPHIN)  IV  1 g Intravenous Q24H  . docusate sodium  200 mg Oral Daily  . enoxaparin (LOVENOX) injection  30 mg Subcutaneous Q24H  . levothyroxine  88 mcg Oral QAC breakfast  . mirtazapine  15 mg Oral QHS  . NIFEdipine  30 mg Oral Daily  . omega-3 acid ethyl esters  2 g Oral Daily  . pantoprazole  40 mg Oral Daily  . tamsulosin  0.4 mg Oral QPC supper   Continuous Infusions:   LOS: 1 day    Time spent: 40 minutes   Kaelynne Christley, Geraldo Docker, MD [facility] Pager [phone]   If 7PM-7AM, please contact night-coverage [email or web address] Password [email or web address] 04/29/2016, 8:18 AM

## 2016-04-29 NOTE — Progress Notes (Signed)
@  [place] of [facility] regarding pt's increasing confusion (oriented to self only and more lethargic, earlier today was A+Ox4), periods of apnea interspersed with tachypnea (RR 30s-40s), poor air movement on auscultation (currently 6L [place]) and generally poor appearance.   @2219  K Scorr returned page. No orders received but NP endorsed replacing Bi-Pap and calling back for AM ABG if pt's condition deteriorated further.   Update @0131  11/8: Pt continues to be confused and vitals remain as above. Condition appears to be stable. Will update NP prior to shift change and continue to monitor.  Updated @0233  BiPap replaced by RT secondary to continued increased work of breathing. [facility] of [facility] paged @0243  to request something to relax pt due to his confusion and desire to remove Bi-Pap.  @approx  0315 page returned and 1 time dose of Ativan ordered.  With administration of Ativan pt, though still confused, calmed and appears to be resting comfortably.  @approx . 0600 pt's daughter Jana Half called and updated regarding pt's condition and need to for safety mittens and Ativan this AM. Daughter amenable to plan and endorsed she would be at pt's bedside around 1100. Will convey to Day RN and continue to monitor.

## 2016-04-29 NOTE — Progress Notes (Signed)
    Subjective:  Pt sitting in chair, son at bedside. Now on O2 per [place]. Complains of shortness of breath. No chest pain.   Objective:  Vital Signs in the last 24 hours: Temp:  [97.1 F (36.2 C)-98.3 F (36.8 C)] 97.1 F (36.2 C) (11/07 1100) Pulse Rate:  [29-116] 48 (11/07 1100) Resp:  [9-39] 24 (11/07 1100) BP: (114-150)/(48-90) 150/90 (11/07 1100) SpO2:  [92 %-100 %] 96 % (11/07 1100) FiO2 (%):  [35 %] 35 % (11/06 1203) Weight:  [50.3 kg (110 lb 14.3 oz)] 50.3 kg (110 lb 14.3 oz) (11/07 0343)  Intake/Output from previous day: 11/06 0701 - 11/07 0700 In: 71 [IV Piggyback:50] Out: 650 [Urine:650]  Physical Exam: Pt is alert and oriented, frail, elderly male in NAD HEENT: normal Neck: JVP - normal Lungs: poor air movement bilaterally CV: distant heart sounds, irregularly irregular with 2/6 systolic murmur at the LSB Abd: soft, NT, thin Ext: no C/C/El Skin: warm/dry no rash   Lab Results:  Recent Labs  05/19/2016 0754 04/29/16 0328  WBC 6.2 6.8  HGB 12.5* 12.2*  PLT 181 188    Recent Labs  05/14/2016 0754 04/29/16 0328  NA 140 143  K 3.9 4.4  CL 107 109  CO2 20* 21*  GLUCOSE 135* 126*  BUN 100* 97*  CREATININE 2.31* 2.25*    Recent Labs  04/27/2016 1540 05/22/2016 1909  TROPONINI 0.98* 0.92*    Tele: Atrial fibrillation, heart rate controlled  Assessment/Plan:  1. Acute on chronic respiratory failure, appears stable today 2. Acute on chronic systolic heart failure 3. Severe aortic insufficiency, non-operative candidate 4. Severe ischemic cardiomyopathy 5. Acute on chronic kidney injury  No major change from yesterday's evaluation, except patient now off of BiPap and on O2 per [place]. He appears to be in mild respiratory distress with any talking. He does not appear volume overloaded on exam and I am concerned about diuresing him with his kidney disease. Will repeat CXR today and make decision about diuretic Rx. Pt being treated for pneumonia by [facility] team.  Unfortunately not much to offer from cardiac perspective. Holding antihypertensive Rx.   Sherren Mocha, M.D. 04/29/2016, 11:07 AM

## 2016-04-29 NOTE — Care Management Note (Signed)
Case Management Note  Patient Details  Name: Isaiah Graham MRN: [id number] Date of Birth: [birth year]  Subjective/Objective:   Presents with pna, chf, resp failure, patient was on bipap, and NRB and now on 6 liters, has hx of esophagela ca,  Patient has been made comfort care, per Palliative plan for hospital expiration in hours already with apneic breathing.  NCM will cont to follow for dc needs.                  Action/Plan:   Expected Discharge Date:                  Expected Discharge Plan:  [place]  In-House Referral:     Discharge planning Services  CM Consult  Post Acute Care Choice:    Choice offered to:     DME Arranged:    DME Agency:     HH Arranged:    [place] Agency:     Status of Service:  In process, will continue to follow  If discussed at Long Length of Stay Meetings, dates discussed:    Additional Comments:  Zenon Mayo, RN 04/29/2016, 4:31 PM

## 2016-04-30 DIAGNOSIS — Z515 Encounter for palliative care: Secondary | ICD-10-CM

## 2016-04-30 DIAGNOSIS — I350 Nonrheumatic aortic (valve) stenosis: Secondary | ICD-10-CM

## 2016-04-30 DIAGNOSIS — Z66 Do not resuscitate: Secondary | ICD-10-CM

## 2016-04-30 DIAGNOSIS — R0600 Dyspnea, unspecified: Secondary | ICD-10-CM

## 2016-04-30 DIAGNOSIS — R0609 Other forms of dyspnea: Secondary | ICD-10-CM

## 2016-04-30 DIAGNOSIS — L8990 Pressure ulcer of unspecified site, unspecified stage: Secondary | ICD-10-CM | POA: Insufficient documentation

## 2016-04-30 LAB — RESPIRATORY PANEL BY PCR
ADENOVIRUS-RVPPCR: NOT DETECTED
Bordetella pertussis: NOT DETECTED
CHLAMYDOPHILA PNEUMONIAE-RVPPCR: NOT DETECTED
CORONAVIRUS 229E-RVPPCR: NOT DETECTED
CORONAVIRUS HKU1-RVPPCR: NOT DETECTED
CORONAVIRUS NL63-RVPPCR: NOT DETECTED
Coronavirus OC43: NOT DETECTED
Influenza A: NOT DETECTED
Influenza B: NOT DETECTED
Metapneumovirus: NOT DETECTED
Mycoplasma pneumoniae: NOT DETECTED
PARAINFLUENZA VIRUS 3-RVPPCR: NOT DETECTED
Parainfluenza Virus 1: NOT DETECTED
Parainfluenza Virus 2: NOT DETECTED
Parainfluenza Virus 4: NOT DETECTED
RHINOVIRUS / ENTEROVIRUS - RVPPCR: NOT DETECTED
Respiratory Syncytial Virus: NOT DETECTED

## 2016-04-30 LAB — MAGNESIUM: MAGNESIUM: 2.5 mg/dL — AB (ref 1.7–2.4)

## 2016-04-30 LAB — BASIC METABOLIC PANEL
ANION GAP: 15 (ref 5–15)
BUN: 119 mg/dL — AB (ref 6–20)
CALCIUM: 11 mg/dL — AB (ref 8.9–10.3)
CO2: 18 mmol/L — ABNORMAL LOW (ref 22–32)
Chloride: 108 mmol/L (ref 101–111)
Creatinine, Ser: 2.86 mg/dL — ABNORMAL HIGH (ref 0.61–1.24)
GFR calc Af Amer: 22 mL/min — ABNORMAL LOW (ref >60)
GFR, EST NON AFRICAN AMERICAN: 19 mL/min — AB (ref >60)
Glucose, Bld: 158 mg/dL — ABNORMAL HIGH (ref 65–99)
POTASSIUM: 4.5 mmol/L (ref 3.5–5.1)
SODIUM: 141 mmol/L (ref 135–145)

## 2016-04-30 MED ORDER — HYDROMORPHONE HCL 1 MG/ML IJ SOLN
1.0000 mg | INTRAMUSCULAR | Status: DC | PRN
  Administered 2016-04-30 – 2016-05-01 (×4): 0.5 mg via INTRAVENOUS
  Administered 2016-05-01 (×3): 1 mg via INTRAVENOUS
  Filled 2016-04-30 (×8): qty 1

## 2016-04-30 MED ORDER — HALOPERIDOL LACTATE 5 MG/ML IJ SOLN
INTRAMUSCULAR | Status: AC
  Filled 2016-04-30: qty 1

## 2016-04-30 MED ORDER — HALOPERIDOL LACTATE 5 MG/ML IJ SOLN
2.0000 mg | INTRAMUSCULAR | Status: DC | PRN
  Administered 2016-04-30 – 2016-05-01 (×2): 2 mg via INTRAVENOUS
  Filled 2016-04-30: qty 1

## 2016-04-30 MED ORDER — HYDROMORPHONE HCL 1 MG/ML IJ SOLN
0.5000 mg | INTRAMUSCULAR | Status: DC | PRN
  Administered 2016-04-30: 0.5 mg via INTRAVENOUS
  Filled 2016-04-30: qty 1

## 2016-04-30 MED ORDER — LORAZEPAM 2 MG/ML IJ SOLN
0.5000 mg | Freq: Once | INTRAMUSCULAR | Status: AC
  Administered 2016-04-30: 0.5 mg via INTRAVENOUS
  Filled 2016-04-30: qty 1

## 2016-04-30 NOTE — Progress Notes (Signed)
RT was contacted by nurse about pt's increased WOB. Upon arrival, pt rate 30s to 40s. Pt is also confused. Pt states that he is currently struggling to breathe. Pt placed back on BiPAP and tolerating well.  Pt placed on following settings: IPAP: 12, EPAP: 6, rate: 8, FiO2: 35%  RT will continue to monitor.

## 2016-04-30 NOTE — Consult Note (Signed)
Consultation Note Date: 04/30/2016   Patient Name: Isaiah Graham  DOB: 05/08/1930  MRN: [id number]  Age / Sex: 82 y.o., male  PCP: Eulas Post, MD Referring Physician: Cherene Altes, MD  Reason for Consultation: Establishing goals of care and Psychosocial/spiritual support  HPI/Patient Profile: 82 y.o. male  admitted on 05/13/2016 PMH with CAD, systolic heart failure last EF measured in March 2017 was 20-25%, hypertension, moderate aortic stenosis and severe aortic insufficiency was brought to the ER after patient was complaining of increasing shortness of breath.  As per patient, patient has been having shortness of breath over the last 3 weeks which acutely worsened last night.  The patient clinical status has significantly deteriorated over the past 24 hours  His overall prognosis is and has been poor based on severe valvular and coronary heart disease with congestive heart failure and severe LV dysfunction. He is not a candidate for aggressive therapies or surgeries.    Family faces with advanced directive decisions and anticipatory care needs  Clinical Assessment and Goals of Care:    This NP Wadie Lessen reviewed medical records, received report from team, assessed the patient and then meet at the patient's bedside along with his large family to include wife, children, in laws and grand children to discuss diagnosis, prognosis, GOC, EOL wishes disposition and options.  A detailed discussion was had today regarding advanced directives.  Concepts specific to code status, artifical feeding and hydration, continued IV antibiotics and rehospitalization was had.  The difference between a aggressive medical intervention path  and a palliative comfort care path for this patient at this time was had.  Values and goals of care important to patient and family were attempted to be elicited.  Concept  of  Palliative Care was discussed  Hard choices booklet left for review  Natural trajectory and expectations at EOL were discussed.  Questions and concerns addressed.   Family encouraged to call with questions or concerns.  PMT will continue to support holistically.   HCPOA- wife and daughter    SUMMARY OF RECOMMENDATIONS    Code Status/Advance Care Planning:  DNRdocumented today   Symptom Management:    Agitation: Haldol 1 mg IV every 3 hrs prn  Dyspnea: Dilaudid 1 mg IV every 1 hr prn  Oxygen vis nasal cannula for comfort only, no need to titrate  Palliative Prophylaxis:   Aspiration, Bowel Regimen, Delirium Protocol, Frequent Pain Assessment and Oral Care  Additional Recommendations (Limitations, Scope, Preferences):  Full Comfort Care  Psycho-social/Spiritual:   Desire for further Chaplaincy support:no  Additional Recommendations: Education on Hospice  Prognosis:   Hours - Days  Discharge Planning: Anticipated Hospital Death      Primary Diagnoses: Present on Admission: . Acute and chronic respiratory failure with hypoxia ([place]) . Hypothyroidism . Essential hypertension . CKD (chronic kidney disease) stage 4, GFR 15-29 ml/min (HCC) . Aortic insufficiency . Community acquired pneumonia . Acute respiratory failure with hypoxia ([place])   I have reviewed the medical record, interviewed the patient and  family, and examined the patient. The following aspects are pertinent.  Past Medical History:  Diagnosis Date  . Aortic aneurysm, abdominal (HCC)    repair  . Aortic stenosis 09/17/2015   a. Echo 08/2015: Moderate AS  . Arthritis   . Barrett esophagus   . CKD (chronic kidney disease) stage 4, GFR 15-29 ml/min (HCC)   . Dilated cardiomyopathy ([place]) 09/17/2015   a. Echo 08/2015: EF 20-25% w. global HK and inferolateral AK  . Esophageal cancer ([place]) 1990  . Esophageal stricture   . Food impaction of esophagus   . Gastroparesis   . Hypertension   .  Hypothyroidism   . Inguinal hernia February 2013   right   Social History   Social History  . Marital status: Married    Spouse name: N/A  . Number of children: 5  . Years of education: N/A   Occupational History  . retired    Social History Main Topics  . Smoking status: Former Smoker    Packs/day: 0.50    Years: 22.00    Types: Cigarettes    Quit date: 12/04/1970  . Smokeless tobacco: Never Used  . Alcohol use No  . Drug use: No  . Sexual activity: Not Asked   Other Topics Concern  . None   Social History Narrative  . None   Family History  Problem Relation Age of Onset  . Pancreatic cancer Father   . Glaucoma Sister   . Arthritis/Rheumatoid Mother   . Aneurysm Daughter     brain   Scheduled Meds: . aspirin  81 mg Oral Daily  . azithromycin  500 mg Intravenous Q24H  . cefTRIAXone (ROCEPHIN)  IV  1 g Intravenous Q24H  . docusate sodium  200 mg Oral Daily  . enoxaparin (LOVENOX) injection  30 mg Subcutaneous Q24H  . levalbuterol  1.25 mg Nebulization Q6H  . levothyroxine  88 mcg Oral QAC breakfast  . LORazepam  1 mg Oral Once  . methylPREDNISolone (SOLU-MEDROL) injection  60 mg Intravenous Q24H  . mirtazapine  15 mg Oral QHS  . NIFEdipine  30 mg Oral Daily  . omega-3 acid ethyl esters  2 g Oral Daily  . pantoprazole  40 mg Oral Daily  . tamsulosin  0.4 mg Oral QPC supper   Continuous Infusions: PRN Meds:.acetaminophen **OR** acetaminophen, ondansetron **OR** ondansetron (ZOFRAN) IV Medications Prior to Admission:  Prior to Admission medications   Medication Sig Start Date End Date Taking? Authorizing Provider  aspirin 81 MG tablet Take 81 mg by mouth daily.   Yes Historical Provider, MD  docusate sodium (COLACE) 100 MG capsule Take 200 mg by mouth daily.   Yes Historical Provider, MD  hydrochlorothiazide (MICROZIDE) 12.5 MG capsule Take 1 capsule (12.5 mg total) by mouth daily. 08/29/15  Yes Eulas Post, MD  levothyroxine (SYNTHROID, LEVOTHROID) 88  MCG tablet Take 1 tablet (88 mcg total) by mouth daily. 09/07/15  Yes Eulas Post, MD  mirtazapine (REMERON) 15 MG tablet TAKE 1 TABLET AT BEDTIME 03/17/16  Yes Eulas Post, MD  NIFEdipine (PROCARDIA-XL/ADALAT-CC/NIFEDICAL-XL) 30 MG 24 hr tablet Take 1 tablet (30 mg total) by mouth daily. 02/11/16  Yes Liliane Shi, PA-C  Omega-3 Fatty Acids (FISH OIL PO) Take 1 capsule by mouth daily.   Yes Historical Provider, MD  omeprazole (PRILOSEC) 20 MG capsule TAKE 1 CAPSULE DAILY 04/22/16  Yes Eulas Post, MD  Probiotic Product (PROBIOTIC COLON SUPPORT) CAPS Take 1 capsule by mouth every evening.  Yes Historical Provider, MD  tamsulosin (FLOMAX) 0.4 MG CAPS capsule TAKE 1 CAPSULE (0.4 MG TOTAL) BY MOUTH DAILY AFTER SUPPER. 08/29/15  Yes Eulas Post, MD  traMADol (ULTRAM) 50 MG tablet Take 1-2 tablets (50-100 mg total) by mouth every 6 (six) hours as needed for severe pain. Patient not taking: Reported on 04/29/2016 10/12/15   Cherene Altes, MD   Allergies  Allergen Reactions  . Morphine And Related Other (See Comments)    Intolerance - hallucinations, delusions on high doses per daughter   Review of Systems  Unable to perform ROS: Acuity of condition    Physical Exam  Constitutional: He appears lethargic. He appears cachectic. He appears ill.  Cardiovascular: Normal rate, regular rhythm and normal heart sounds.   Pulmonary/Chest: He has decreased breath sounds.  - BiPap converted to nasal cannula  Neurological: He appears lethargic.  Skin: Skin is warm and dry.    Vital Signs: BP (!) 124/52 (BP Location: Left Arm)   Pulse 78   Temp 98.4 F (36.9 C) (Axillary)   Resp (!) 33   Ht 5\' 7"  (1.702 m)   Wt 53.4 kg (117 lb 11.6 oz)   SpO2 99%   BMI 18.44 kg/m  Pain Assessment: PAINAD   Pain Score: 0-No pain   SpO2: SpO2: 99 % O2 Device:SpO2: 99 % O2 Flow Rate: .O2 Flow Rate (L/min): 6 L/min  IO: Intake/output summary:  Intake/Output Summary (Last 24 hours) at  04/30/16 1031 Last data filed at 04/30/16 0440  Gross per 24 hour  Intake              610 ml  Output              400 ml  Net              210 ml    LBM: Last BM Date: 04/27/16 Baseline Weight: Weight: 50.8 kg (112 lb) Most recent weight: Weight: 53.4 kg (117 lb 11.6 oz)      Palliative Assessment/Data: 20 %    Discussed with Dr Thereasa Solo  Time In: 1230 Time Out: 1400 Time Total: 90 min Greater than 50%  of this time was spent counseling and coordinating care related to the above assessment and plan.  Signed by: Wadie Lessen, NP   Please contact Palliative Medicine Team phone at [phone] for questions and concerns.  For individual provider: See Shea Evans

## 2016-04-30 NOTE — Progress Notes (Signed)
Initial Nutrition Assessment  DOCUMENTATION CODES:   Severe malnutrition in context of chronic illness, Underweight  INTERVENTION:    Daughter to continue to bring in Boost supplements from home as needed.  NUTRITION DIAGNOSIS:   Malnutrition related to chronic illness as evidenced by severe depletion of muscle mass, severe depletion of body fat.  GOAL:   Patient will meet greater than or equal to 90% of their needs  MONITOR:   PO intake, Skin, I & O's  REASON FOR ASSESSMENT:   Malnutrition Screening Tool    ASSESSMENT:   82 year old male admitted on 11/6 with SOB. Hx of CAD, CHF, cardiomyopathy, HTN, esophageal cancer, and esophageal stricture.  Patient having difficulty breathing during RD visit. RN and RT in with patient. Nutrition-Focused physical exam completed. Findings are severe fat depletion, severe muscle depletion, and no edema.  Per discussion with RN, patient was alert and taking some PO's yesterday. Daughter brought in some Boost supplements from home for patient and he was drinking them. Today, he has had nothing to eat due to respiratory distress and confusion. Patient with severe PCM. Labs reviewed: magnesium elevated at 2.5. Medications reviewed and include colace, solu-medrol, remeron, flomax.  Diet Order:  Diet Heart Room service appropriate? Yes; Fluid consistency: Thin  Skin:  Wound (see comment) (stage II to sacrum)  Last BM:  11/5  Height:   Ht Readings from Last 1 Encounters:  05/22/2016 5\' 7"  (1.702 m)    Weight:   Wt Readings from Last 1 Encounters:  04/30/16 117 lb 11.6 oz (53.4 kg)    Ideal Body Weight:  61.4 kg  BMI:  Body mass index is 18.44 kg/m.  Estimated Nutritional Needs:   Kcal:  1650-1850  Protein:  75-90 gm  Fluid:  1.6-1.8 L  EDUCATION NEEDS:   No education needs identified at this time  Molli Barrows, [place], [place], [place] Pager [phone] After Hours Pager [phone]

## 2016-04-30 NOTE — Progress Notes (Addendum)
Paged Dr. Thereasa Solo regarding change in patients condition, patient unresponsive to commands, agitated, and requiring more oxygen support. Palliative team called as well in order to coordinate family meeting for goals of care later today. Will continue to monitor.

## 2016-04-30 NOTE — Progress Notes (Signed)
Bipap changes due to low VT and MVe.

## 2016-04-30 NOTE — Progress Notes (Signed)
    Subjective:  No history obtainable this morning.  Objective:  Vital Signs in the last 24 hours: Temp:  [97 F (36.1 C)-98.4 F (36.9 C)] 98.4 F (36.9 C) (11/08 0305) Pulse Rate:  [48-102] 78 (11/08 0954) Resp:  [18-33] 33 (11/08 0954) BP: (113-150)/(52-90) 124/52 (11/08 0828) SpO2:  [90 %-100 %] 99 % (11/08 0954) FiO2 (%):  [35 %-93 %] 35 % (11/08 0954) Weight:  [53.4 kg (117 lb 11.6 oz)] 53.4 kg (117 lb 11.6 oz) (11/08 0323)  Intake/Output from previous day: 11/07 0701 - 11/08 0700 In: 51 [P.O.:60; IV Piggyback:550] Out: 400 [Urine:400]  Physical Exam: Pt is elderly, frail, on BiPap, poorly responsive this am. HEENT: normal Neck: JVP - normal Lungs: poor air movement bilaterally CV: irregular with 2/6 systolic murmur at the LSB Abd: soft, NT, Positive BS, no hepatomegaly Ext: no edema Skin: warm/dry no rash  Lab Results:  Recent Labs  05/17/2016 0754 04/29/16 0328  WBC 6.2 6.8  HGB 12.5* 12.2*  PLT 181 188    Recent Labs  04/29/16 0328 04/30/16 0408  NA 143 141  K 4.4 4.5  CL 109 108  CO2 21* 18*  GLUCOSE 126* 158*  BUN 97* 119*  CREATININE 2.25* 2.86*    Recent Labs  05/11/2016 1540 05/14/2016 1909  TROPONINI 0.98* 0.92*   Tele: Atrial fibrillation, personally reviewed  Assessment/Plan:  1. Acute on chronic respiratory failure, pt declining now on BiPap and poorly responsive 2. Acute on chronic systolic heart failure, NYHA IV 3. Severe aortic insufficiency, non-operative candidate 4. Severe ischemic cardiomyopathy 5. Acute on chronic kidney injury: creatinine trending up  The patient clinical status has significantly deteriorated over the past 24 hours - yesterday he was sitting up in the chair and was conversant. His overall prognosis is and has been poor based on severe valvular and coronary heart disease with congestive heart failure and severe LV dysfunction. He is not a candidate for aggressive therapies or surgeries. I think a  palliative approach to his care is most appropriate. There is a palliative care meeting arranged with the patient's family later this morning - appreciate the care of the palliative and [place] teams.  Sherren Mocha, M.D. 04/30/2016, 10:17 AM

## 2016-04-30 NOTE — Progress Notes (Signed)
[place] TEAM 1 - Stepdown/ICU TEAM  MONEY ETLING  [id number] DOB: [birth year] DOA: 04/27/2016 PCP: Eulas Post, MD    Brief Narrative:  82 y.o. male with CAD, systolic heart failure (EF 20-25% March 2017), hypertension, moderate aortic stenosis, and severe aortic insufficiency who was brought to the ER complaining of increasing shortness of breath w/o chest pain over 3 weeks.    Subjective: The pt is in distress this morning, with signif increase in his WOB.  He is confused and unable to provide a hx.  His family is currently meeting w/ PC to determine goals of care.  I feel that comfort focused is most appropriate.    Assessment & Plan:  Acute hypoxic respiratory failure with hypoxia Felt to be primarily cardiac in nature, with perhaps some contribution from PNA   Chronic systolic CHF  EF 0000000 - baseline wgt ~52kg - no BB due to bradycardia - no ACEi/ARB due to CKD - w/ progressive renal failure diuresis will be difficult if family decides to cont aggressive care   Filed Weights   04/27/2016 0306 04/29/16 0343 04/30/16 0323  Weight: 50.8 kg (112 lb) 50.3 kg (110 lb 14.3 oz) 53.4 kg (117 lb 11.6 oz)    CAD Underwent right and left heart catheterization in 4/17 which demonstrated severe 2 vessel CAD with total occlusion of the RCA and severe stenosis of LAD with heavy diffuse calcification - limited to medical tx only - Cards has been following with [place]   Hypertension BP controlled   Acute renal failure on Chronic kidney disease stage IV - Acute uremia  Renal fxn is declining - BUN is now > 100 and climbing - I do not think this process is reversible   Recent Labs Lab 05/03/2016 0338 05/03/2016 0754 04/29/16 0328 04/30/16 0408  CREATININE 2.27* 2.31* 2.25* 2.86*    History of aortic stenosis moderate with severe aortic insufficiency  Hypothyroidism on Synthroid  History of esophageal cancer status post surgery  DVT prophylaxis: lovenox  Code Status:  FULL CODE Family Communication: family in meeting w/ PC at time of visit  Disposition Plan: await results of PC meeting   Consultants:  [facility]   Procedures: none  Antimicrobials:  Ceftriaxone 11/5 > Azithromycin 11/5 >  Objective: Blood pressure (!) 124/52, pulse 78, temperature 98.4 F (36.9 C), temperature source Axillary, resp. rate (!) 33, height 5\' 7"  (1.702 m), weight 53.4 kg (117 lb 11.6 oz), SpO2 99 %.  Intake/Output Summary (Last 24 hours) at 04/30/16 1154 Last data filed at 04/30/16 0440  Gross per 24 hour  Intake              610 ml  Output              400 ml  Net              210 ml   Filed Weights   04/26/2016 0306 04/29/16 0343 04/30/16 0323  Weight: 50.8 kg (112 lb) 50.3 kg (110 lb 14.3 oz) 53.4 kg (117 lb 11.6 oz)    Examination: General: appears dyspneic and agitated - does not open his eyes or respond  Lungs: course crackles th/o - no wheezing  Cardiovascular: tachycardic  Abdomen: Nondistended, soft, no rebound, no ascites, no appreciable mass Extremities: No significant cyanosis, clubbing, edema bilateral lower extremities   CBC:  Recent Labs Lab 05/14/2016 0338 04/23/2016 0754 04/29/16 0328  WBC 5.4 6.2 6.8  NEUTROABS 4.3 4.7  --   HGB 13.6  12.5* 12.2*  HCT 39.8 37.2* 37.1*  MCV 90.9 90.5 92.1  PLT 173 181 0000000   Basic Metabolic Panel:  Recent Labs Lab 05/02/2016 0338 05/22/2016 0754 04/29/16 0328 04/30/16 0408  NA 142 140 143 141  K 4.4 3.9 4.4 4.5  CL 108 107 109 108  CO2 20* 20* 21* 18*  GLUCOSE 122* 135* 126* 158*  BUN 98* 100* 97* 119*  CREATININE 2.27* 2.31* 2.25* 2.86*  CALCIUM 10.9* 10.4* 10.6* 11.0*  MG  --   --   --  2.5*   GFR: Estimated Creatinine Clearance: 14.5 mL/min (by C-G formula based on SCr of 2.86 mg/dL (H)).  Liver Function Tests:  Recent Labs Lab 04/27/2016 0338 04/30/2016 0754  AST 40 30  ALT 35 29  ALKPHOS 85 77  BILITOT 0.9 0.7  PROT 7.4 6.7  ALBUMIN 3.9 3.4*    Cardiac Enzymes:  Recent  Labs Lab 05/20/2016 0754 05/09/2016 1540 05/07/2016 1909  TROPONINI 0.59* 0.98* 0.92*    HbA1C: Hgb A1c MFr Bld  Date/Time Value Ref Range Status  10/11/2015 05:15 AM 5.7 (H) 4.8 - 5.6 % Final    Comment:    (NOTE)         Pre-diabetes: 5.7 - 6.4         Diabetes: >6.4         Glycemic control for adults with diabetes: <7.0     Recent Results (from the past 240 hour(s))  Blood culture (routine x 2)     Status: None (Preliminary result)   Collection Time: 05/08/2016  4:10 AM  Result Value Ref Range Status   Specimen Description BLOOD RIGHT ANTECUBITAL  Final   Special Requests BOTTLES DRAWN AEROBIC AND ANAEROBIC 5CC EA  Final   Culture NO GROWTH 2 DAYS  Final   Report Status PENDING  Incomplete  Blood culture (routine x 2)     Status: None (Preliminary result)   Collection Time: 05/20/2016  4:15 AM  Result Value Ref Range Status   Specimen Description BLOOD RIGHT HAND  Final   Special Requests IN PEDIATRIC BOTTLE 2CC  Final   Culture NO GROWTH 2 DAYS  Final   Report Status PENDING  Incomplete  MRSA PCR Screening     Status: None   Collection Time: 04/30/2016  7:54 AM  Result Value Ref Range Status   MRSA by PCR NEGATIVE NEGATIVE Final    Comment:        The GeneXpert MRSA Assay (FDA approved for NASAL specimens only), is one component of a comprehensive MRSA colonization surveillance program. It is not intended to diagnose MRSA infection nor to guide or monitor treatment for MRSA infections.      Scheduled Meds: . aspirin  81 mg Oral Daily  . azithromycin  500 mg Intravenous Q24H  . cefTRIAXone (ROCEPHIN)  IV  1 g Intravenous Q24H  . docusate sodium  200 mg Oral Daily  . enoxaparin (LOVENOX) injection  30 mg Subcutaneous Q24H  . levalbuterol  1.25 mg Nebulization Q6H  . levothyroxine  88 mcg Oral QAC breakfast  . LORazepam  1 mg Oral Once  . methylPREDNISolone (SOLU-MEDROL) injection  60 mg Intravenous Q24H  . mirtazapine  15 mg Oral QHS  . NIFEdipine  30 mg Oral  Daily  . omega-3 acid ethyl esters  2 g Oral Daily  . pantoprazole  40 mg Oral Daily  . tamsulosin  0.4 mg Oral QPC supper     LOS: 2 days  Cherene Altes, MD [facility] Office  [phone] Pager - Text Page per Amion as per below:  On-Call/Text Page:      [email or web address]      password [email or web address]  If 7PM-7AM, please contact night-coverage [email or web address] Password [email or web address] 04/30/2016, 11:54 AM

## 2016-05-01 DIAGNOSIS — J9621 Acute and chronic respiratory failure with hypoxia: Secondary | ICD-10-CM

## 2016-05-01 DIAGNOSIS — N184 Chronic kidney disease, stage 4 (severe): Secondary | ICD-10-CM

## 2016-05-01 DIAGNOSIS — R0902 Hypoxemia: Secondary | ICD-10-CM

## 2016-05-01 MED ORDER — SODIUM CHLORIDE 0.9 % IV SOLN
0.5000 mg/h | INTRAVENOUS | Status: DC
  Administered 2016-05-01: 0.5 mg/h via INTRAVENOUS
  Filled 2016-05-01: qty 10

## 2016-05-01 MED ORDER — GLYCOPYRROLATE 0.2 MG/ML IJ SOLN
0.4000 mg | Freq: Three times a day (TID) | INTRAMUSCULAR | Status: DC
  Administered 2016-05-01 (×2): 0.4 mg via INTRAVENOUS
  Filled 2016-05-01 (×2): qty 2

## 2016-05-02 NOTE — Progress Notes (Signed)
Pt discharged with forbis and [place]. No belongings with pt. This RN signed pt out downstairs at the morgue with security.  Death certificate given to funeral home.

## 2016-05-03 LAB — CULTURE, BLOOD (ROUTINE X 2)
CULTURE: NO GROWTH
CULTURE: NO GROWTH

## 2016-05-05 ENCOUNTER — Telehealth: Payer: Self-pay | Admitting: Family Medicine

## 2016-05-05 NOTE — Telephone Encounter (Signed)
Called [facility] and cancelled all of pt's rx's.  Called pt's wife and advised.  Nothing further needed at this time.

## 2016-05-05 NOTE — Telephone Encounter (Signed)
Pts family would like to have all [facility] stopped for pt

## 2016-05-14 ENCOUNTER — Ambulatory Visit: Payer: Self-pay

## 2016-05-14 NOTE — Telephone Encounter (Signed)
Pt passed

## 2016-05-22 ENCOUNTER — Ambulatory Visit: Payer: Medicare Other | Admitting: Cardiovascular Disease

## 2016-05-23 NOTE — Progress Notes (Signed)
Daily Progress Note   Patient Name: Isaiah Graham       Date: May 03, 2016 DOB: Oct 11, 1929  Age: 82 y.o. MRN#: [id number] Attending Physician: Allie Bossier, MD Primary Care Physician: Eulas Post, MD Admit Date: 04/23/2016  Reason for Consultation/Follow-up: Establishing goals of care, Non pain symptom management, Pain control and Psychosocial/spiritual support  Subjective:  -continued conversation regarding natural trajectory and expectations at EOL   -discussed symptom management stategies      -family is comfortable with decision for comfort appraoch  -Family encouraged to call with questions or concerns.  PMT will continue to support holistically.   Length of Stay: 3  Current Medications: Scheduled Meds:  . glycopyrrolate  0.4 mg Intravenous TID  . LORazepam  1 mg Oral Once    Continuous Infusions:   PRN Meds: acetaminophen **OR** acetaminophen, haloperidol lactate, HYDROmorphone (DILAUDID) injection, ondansetron **OR** ondansetron (ZOFRAN) IV  Physical Exam  Constitutional: He appears cachectic. Nasal cannula in place.  HENT:  Mouth/Throat: Mucous membranes are dry.  Cardiovascular: Tachycardia present.   Pulmonary/Chest: He has decreased breath sounds in the right lower field and the left lower field.  - 40 sec periods of apnea  Abdominal: Soft.  Neurological: He is unresponsive.  Skin: Skin is warm and dry.            Vital Signs: BP 108/87   Pulse 91   Temp 98.4 F (36.9 C) (Axillary)   Resp (!) 26   Ht 5\' 7"  (1.702 m)   Wt 53.4 kg (117 lb 11.6 oz)   SpO2 93%   BMI 18.44 kg/m  SpO2: SpO2: 93 % O2 Device: O2 Device: Nasal Cannula O2 Flow Rate: O2 Flow Rate (L/min): 2 L/min  Intake/output summary:  Intake/Output Summary (Last 24 hours)  at 05/03/2016 1224 Last data filed at 2016/05/03 0615  Gross per 24 hour  Intake                0 ml  Output              575 ml  Net             -575 ml   LBM: Last BM Date: 04/27/16 Baseline Weight: Weight: 50.8 kg (112 lb) Most recent weight: Weight: 53.4 kg (117 lb 11.6 oz)  Palliative Assessment/Data:  20%      Patient Active Problem List   Diagnosis Date Noted  . Pressure injury of skin 04/30/2016  . DNR (do not resuscitate) 04/30/2016  . Palliative care by specialist 04/30/2016  . Dyspnea   . Elevated troponin   . Hypoxia 05/05/2016  . Acute and chronic respiratory failure with hypoxia ([place]) 04/30/2016  . Community acquired pneumonia 04/24/2016  . Acute respiratory failure with hypoxia ([place]) 04/26/2016  . Coronary artery disease involving native heart without angina pectoris 02/10/2016  . Loss of weight 10/22/2015  . Chronic systolic (congestive) heart failure 10/17/2015  . Chronic systolic heart failure ([place])   . Chronic combined systolic and diastolic heart failure ([place]) 10/09/2015  . History of esophageal cancer 10/09/2015  . H/O esophagectomy 10/09/2015  . Aortic valve stenosis 09/17/2015  . Aortic insufficiency 09/17/2015  . Dilated cardiomyopathy ([place]) 09/17/2015  . CKD (chronic kidney disease) stage 4, GFR 15-29 ml/min (HCC) 11/01/2014  . BPH (benign prostatic hyperplasia) 09/30/2012  . Decubitus ulcer of sacral region, stage 1 08/10/2012  . Postoperative anemia due to acute blood loss 08/09/2012  . Postop Hyponatremia 08/09/2012  . Postop Transfusion 08/09/2012  . OA (osteoarthritis) of hip 08/06/2012  . Osteoarthritis of right hip 07/04/2012  . Stricture and stenosis of esophagus 10/05/2011  . Foreign body in esophagus 10/05/2011  . Other dysphagia 10/05/2011  . Inguinal hernia, right repaired 09/01/2011  08/26/2011  . CONSTIPATION 12/21/2009  . GERD 07/24/2009  . Hypothyroidism 02/19/2009  . Essential hypertension 02/19/2009  . PERSONAL HISTORY  MALIGNANT NEOPLASM ESOPHAGUS 02/19/2009    Palliative Care Assessment & Plan    Assessment: - patient is transitioning at EOL.  He is apneic and unresponsive  Recommendations/Plan:  Comfort and dignity are the focus  Symptom management- consider continuous gtt if patient is requiring frequent prns                                                 Robin al for terminal secretions Goals of Care and Additional Recommendations:  Limitations on Scope of Treatment: Full Comfort Care  Code Status:    Code Status Orders        Start     Ordered   04/30/16 1402  Do not attempt resuscitation (DNR)  Continuous    Question Answer Comment  In the event of cardiac or respiratory ARREST Do not call a "code blue"   In the event of cardiac or respiratory ARREST Do not perform Intubation, CPR, defibrillation or ACLS   In the event of cardiac or respiratory ARREST Use medication by any route, position, wound care, and other measures to relive pain and suffering. May use oxygen, suction and manual treatment of airway obstruction as needed for comfort.      04/30/16 1401    Code Status History    Date Active Date Inactive Code Status Order ID Comments User Context   04/30/2016  2:01 PM  DNR [id number]  Knox Royalty, NP Inpatient   05/02/2016  7:13 AM 04/30/2016  2:01 PM Full Code [id number]  Rise Patience, MD ED   10/17/2015  1:36 PM 10/17/2015 11:20 PM Full Code [id number]  Sherren Mocha, MD Inpatient   10/09/2015 11:10 AM 10/12/2015  3:56 PM DNR [id number]  Waldemar Dickens, MD ED   10/09/2015 11:10 AM 10/09/2015 11:10 AM  DNR [id number]  Waldemar Dickens, MD ED   10/09/2015 10:31 AM 10/09/2015 11:10 AM DNR [id number]  Waldemar Dickens, MD ED   08/06/2012 12:18 PM 08/10/2012  3:19 PM Full Code [id number]  Gearlean Alf, MD Inpatient       Prognosis:   Hours - Days  Discharge Planning:  Anticipated Hospital Death   Thank you for allowing the Palliative Medicine Team to assist in the care of this  patient.   Time In: 0800 Time Out: 0835 Total Time 35 min Prolonged Time Billed  no       Greater than 50%  of this time was spent counseling and coordinating care related to the above assessment and plan.  Wadie Lessen, NP  Please contact Palliative Medicine Team phone at [phone] for questions and concerns.

## 2016-05-23 NOTE — Progress Notes (Signed)
MD notified of patient passing away. Family present at time of death.

## 2016-05-23 NOTE — Progress Notes (Addendum)
Pt daughter stated she did not want pt to go to the morgue, she would like the funeral home to get pt from unit.

## 2016-05-23 NOTE — Progress Notes (Signed)
Called [place] donor [facility] for referral. Pt is not a candidate.

## 2016-05-23 NOTE — Progress Notes (Deleted)
Pt daughter stated she did not want pt to go to morgue and would like funeral home to come up to the unit to get pt.

## 2016-05-23 NOTE — Progress Notes (Signed)
Pt and family well-known to me. Full comfort measures. Spent time with family at bedside. Appreciate care of Dr Thereasa Solo and Wadie Lessen.   Sherren Mocha 05-21-2016 10:44 AM

## 2016-05-23 NOTE — Discharge Summary (Signed)
Death Summary  Isaiah Graham [id number] DOB: Jun 30, 1929 DOA: 2016-05-27  PCP: Eulas Post, MD PCP/Office notified: No  Admit date: May 27, 2016 Date of Death: 05/30/16  Final Diagnoses:  Principal Problem:   Acute and chronic respiratory failure with hypoxia ([place]) Active Problems:   Hypothyroidism   Essential hypertension   CKD (chronic kidney disease) stage 4, GFR 15-29 ml/min (HCC)   Aortic valve stenosis   Aortic insufficiency   Community acquired pneumonia   Acute respiratory failure with hypoxia (HCC)   Elevated troponin   Pressure injury of skin   DNR (do not resuscitate)   Palliative care by specialist   Dyspnea   Acute hypoxic respiratory failure with hypoxia/CAP -COMFORT CARE -Spoken with the family at length and would like to remain in current location until sister arrives who has HCPOA. Will make decision at that time if they would like to be transferred to [address].  Chronic systolic CHF( baseline wgt ~52kg)  -EF 20-25%. With 80% stenosis, LAD,100% stenosis RCA, and 50% stenosis circumflex from cardiac catheterization 10/17/2015 -COMFORT CARE  CAD -See CHF  Hypertension -See CHF   Chronic kidney disease stage IV(baseline Cr ~1.58 to 1.68)      History of present illness:  82 y.o.WM PMHx CAD, Chronic Systolic CHF last EF measured in March 2017 was 20-25%/Dilated cardiomyopathy, HTN, Moderate AS and severe AI, Hypothyroidism,Esophageal cancer, Esophageal stricture  Was brought to the ER after patient was complaining of increasing shortness of breath. As per patient, patient has been having shortness of breath over the last 3 weeks which acutely worsened last night. Denies any chest pain productive cough fever chills nausea vomiting abdominal pain or diarrhea. Chest x-ray shows features concerning for pneumonia. Patient at this time is requiring 100% nonrebreather to maintain sats. On exam patient is not in distress. Patient is being admitted for  acute respiratory failure possibly a combination of CHF and pneumonia. During his hospitalization patient was treated initially for acute respiratory failure with hypoxia secondary to CAP at exacerbation of chronic systolic CHF. It became apparent that patient would not recover and family decided to make patient comfort care. All aggressive diagnostic and therapeutic modalities were withdrawn. Patient passed at [place] with family present.     Time: 1859  Signed:  Dia Crawford, MD [facility] [phone]

## 2016-05-23 DEATH — deceased

## 2017-05-09 IMAGING — US US RENAL
1 series · 14 of 25 positions shown · non-contrast
Comparison: Abdominal CT 12/21/2007.  Aortic ultrasound 04/23/2005.

CLINICAL DATA: Hypertension with elevated serum creatinine level.
Initial encounter.

EXAM:
RENAL / URINARY TRACT ULTRASOUND COMPLETE

[Series 1: us renal · 0.26mm/px · 14 of 38 slices shown]
[im 1/38]
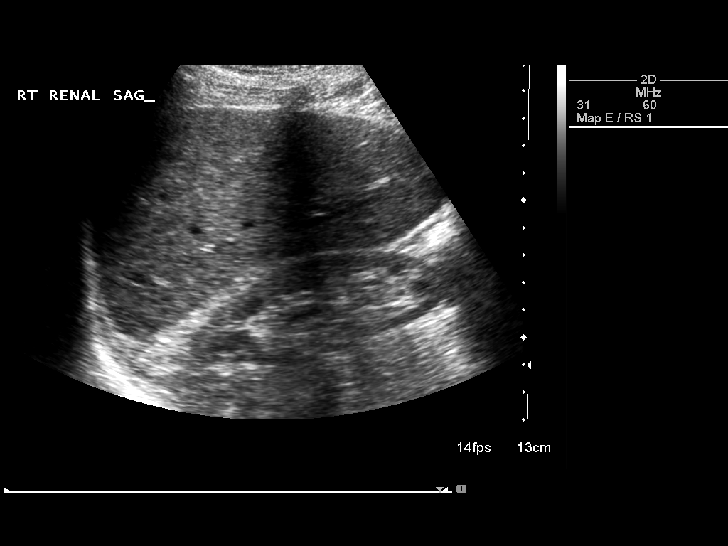
[im 4/38]
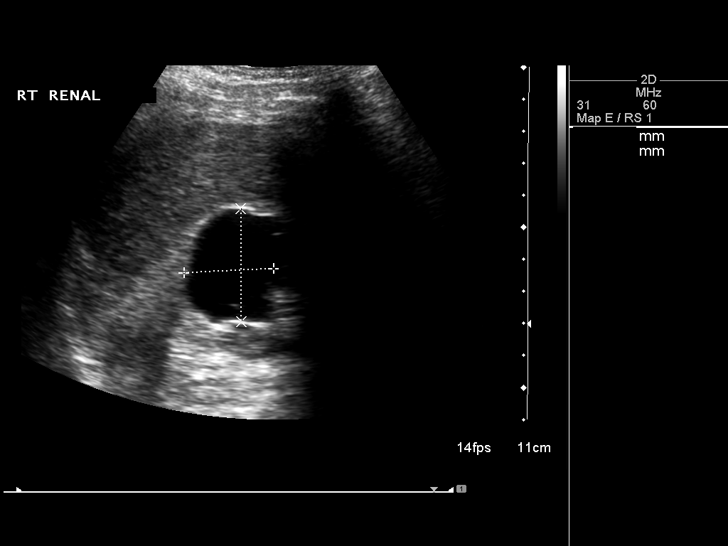
[im 7/38]
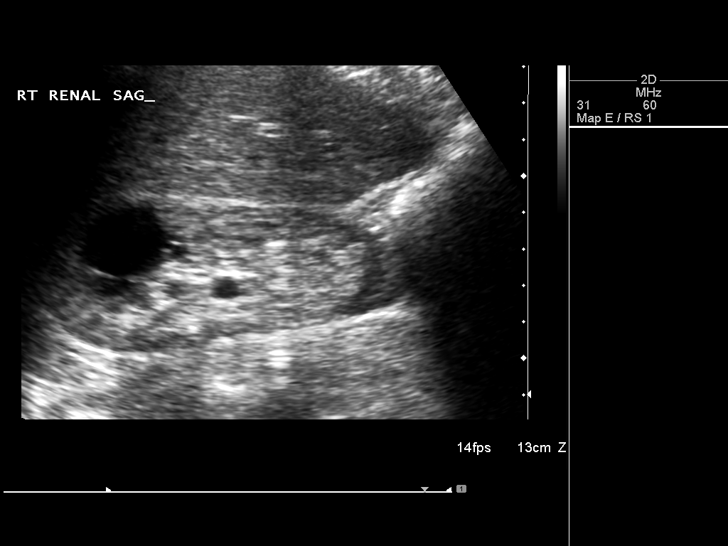
[im 10/38]
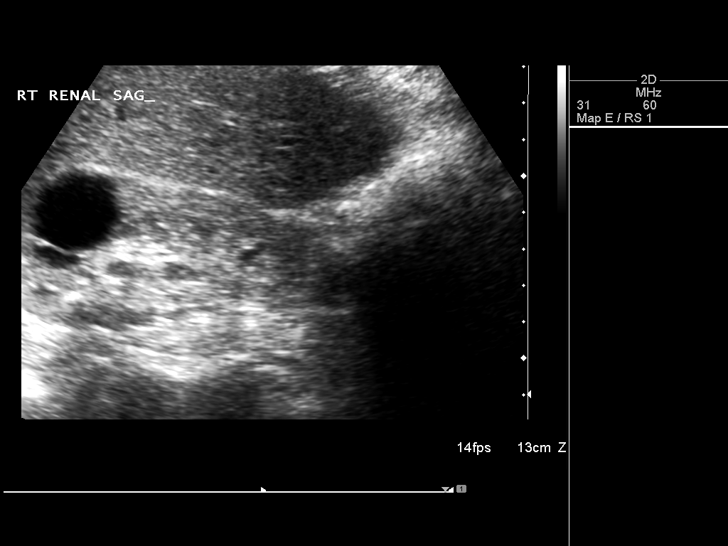
[im 13/38]
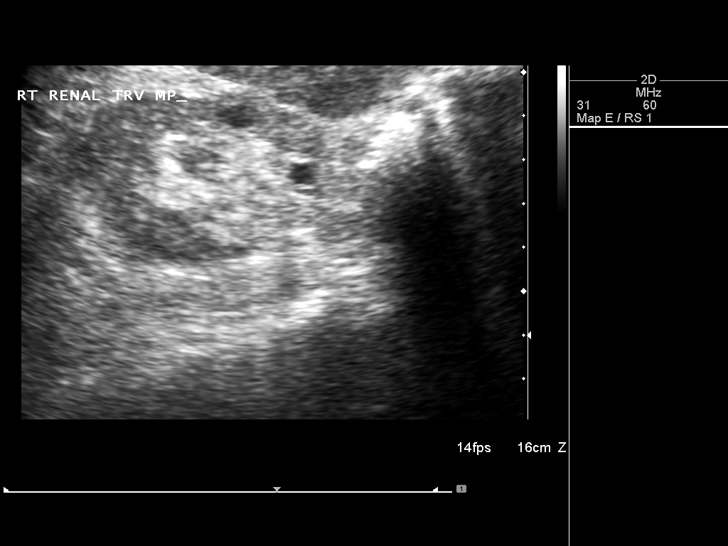
[im 14/38]
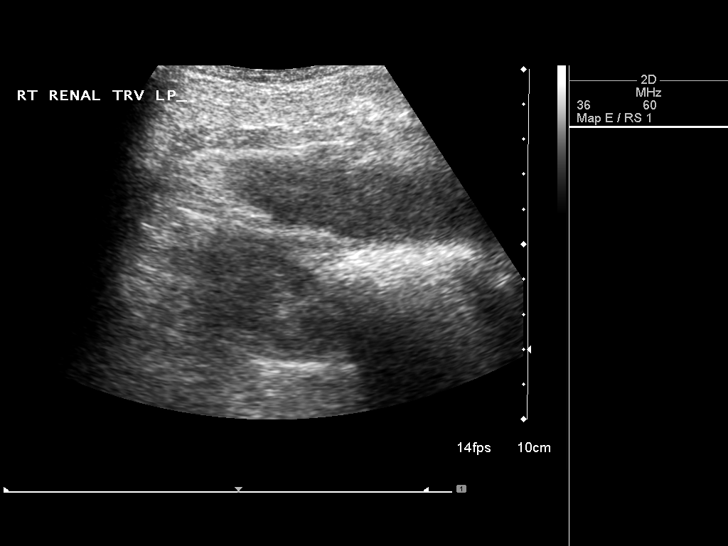
[im 17/38]
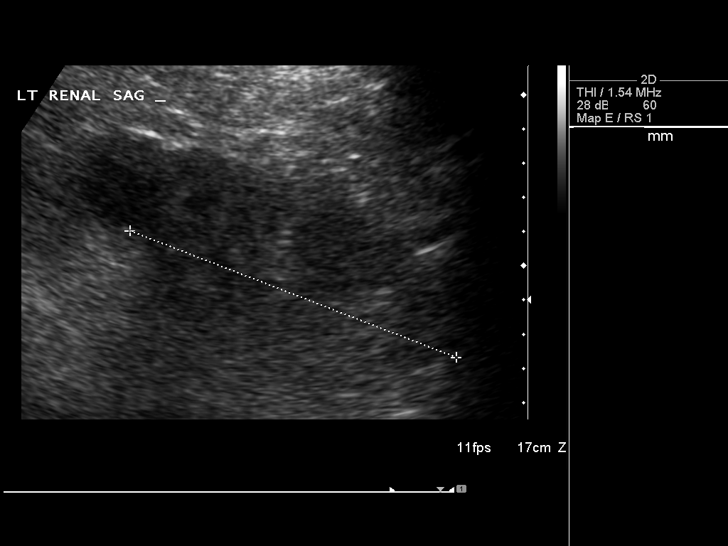
[im 21/38]
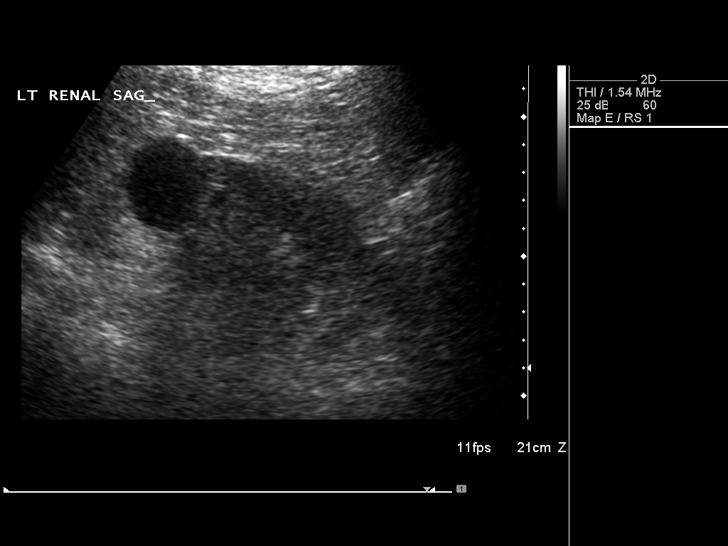
[im 24/38]
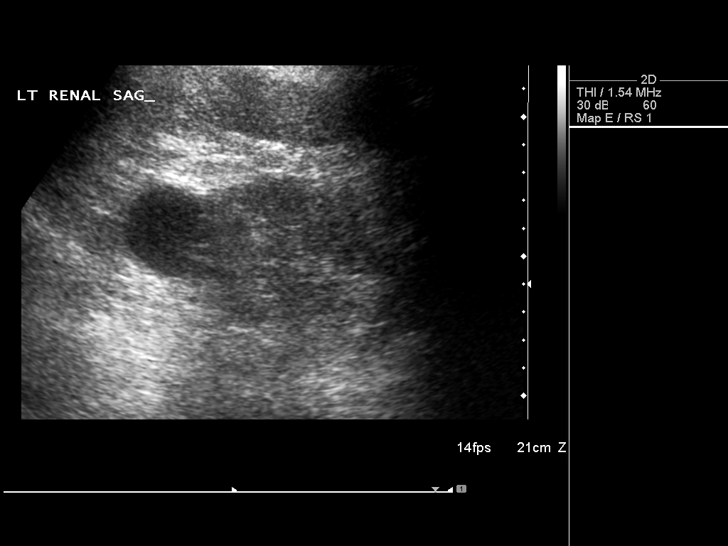
[im 25/38]
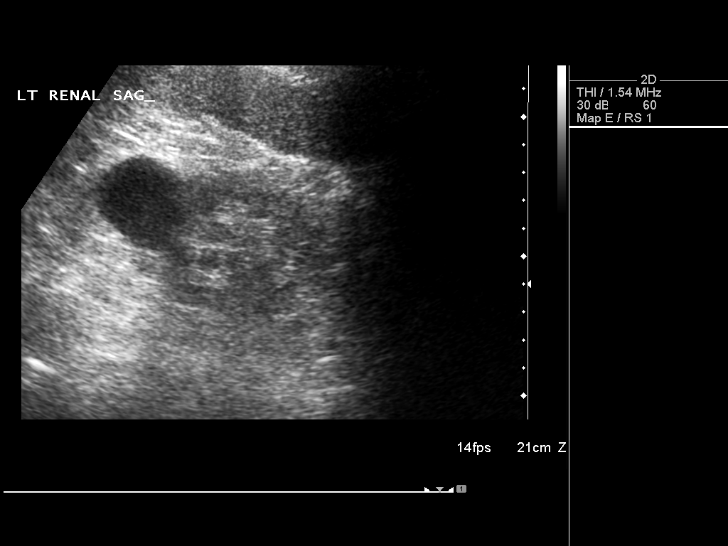
[im 28/38]
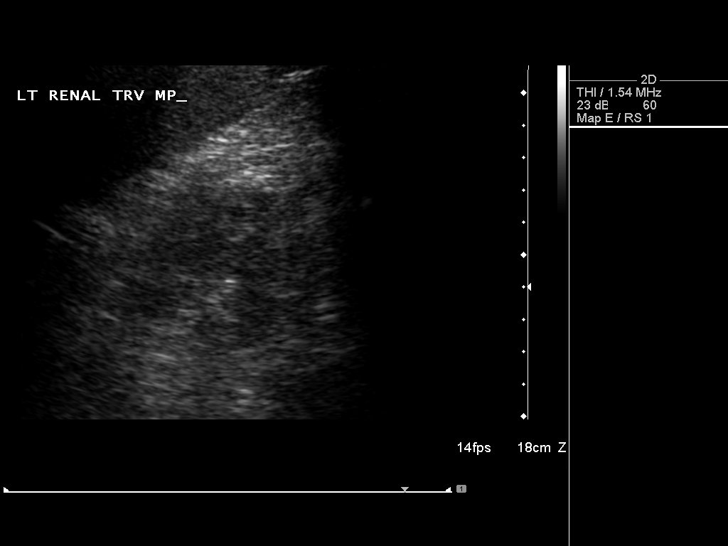
[im 31/38]
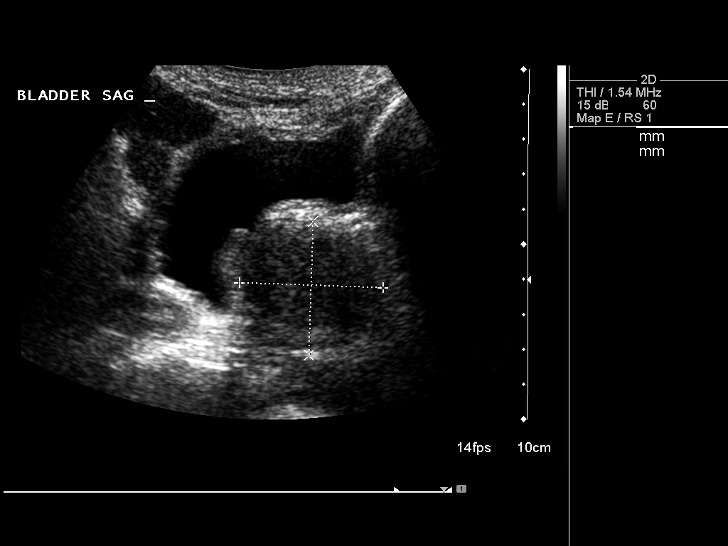
[im 34/38]
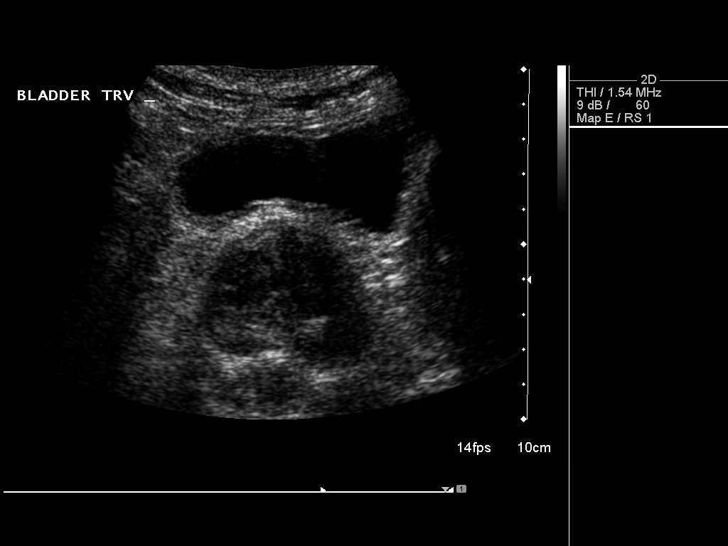
[im 38/38]
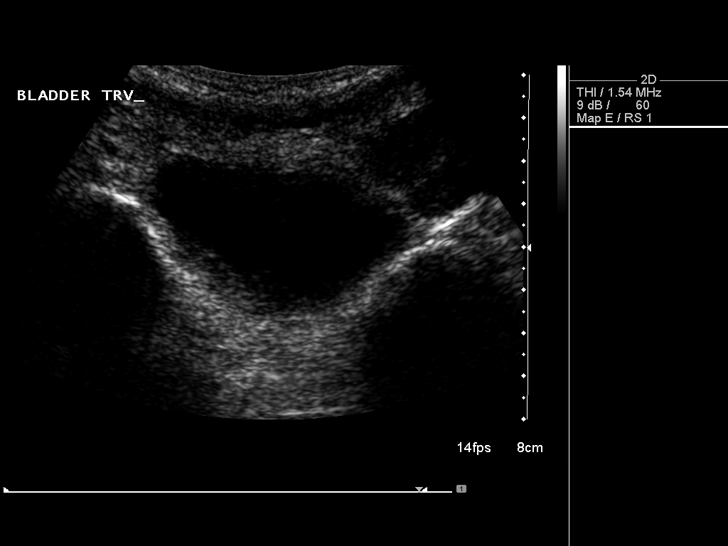

[14 of 25 positions shown; findings below may reference images not displayed]

FINDINGS: Right Kidney:

Length: 10.5 cm. There is renal cortical thinning and increased
echogenicity consistent with chronic medical renal disease.
Enlarging cyst is noted in the upper pole, measuring 2.8 x 3.5 x
cm. No hydronephrosis.

Left Kidney:

Length: 10.2 cm. Suboptimally visualized due to bowel gas. There is
mild cortical thinning and increased echogenicity. There is a cyst
in the upper pole measuring 3.5 x 3.6 x 2.7 cm. No hydronephrosis.

Bladder:

Appears normal for degree of bladder distention. The prostate gland
is enlarged, measuring 4.1 x 3.8 x 4.9 cm.
IMPRESSION: 1. Both kidneys demonstrate mild cortical thinning and increased
echogenicity consistent with medical renal disease. There is no
hydronephrosis.
2. Bilateral renal cysts.
3. Enlarged prostate gland.

## 2018-11-19 ENCOUNTER — Other Ambulatory Visit: Payer: Self-pay
